# Patient Record
Sex: Male | Born: 1935
Health system: Southern US, Community
[De-identification: ages and names within clinical notes are randomized; demographics above are authoritative.]

## PROBLEM LIST (undated history)

## (undated) DIAGNOSIS — K219 Gastro-esophageal reflux disease without esophagitis: Secondary | ICD-10-CM

## (undated) DIAGNOSIS — I10 Essential (primary) hypertension: Secondary | ICD-10-CM

## (undated) DIAGNOSIS — H3320 Serous retinal detachment, unspecified eye: Secondary | ICD-10-CM

## (undated) DIAGNOSIS — Z95828 Presence of other vascular implants and grafts: Secondary | ICD-10-CM

## (undated) DIAGNOSIS — C189 Malignant neoplasm of colon, unspecified: Secondary | ICD-10-CM

## (undated) DIAGNOSIS — Z85038 Personal history of other malignant neoplasm of large intestine: Secondary | ICD-10-CM

## (undated) DIAGNOSIS — C73 Malignant neoplasm of thyroid gland: Secondary | ICD-10-CM

## (undated) DIAGNOSIS — C8590 Non-Hodgkin lymphoma, unspecified, unspecified site: Secondary | ICD-10-CM

## (undated) DIAGNOSIS — C320 Malignant neoplasm of glottis: Principal | ICD-10-CM

## (undated) DIAGNOSIS — E039 Hypothyroidism, unspecified: Secondary | ICD-10-CM

## (undated) DIAGNOSIS — M1990 Unspecified osteoarthritis, unspecified site: Secondary | ICD-10-CM

## (undated) DIAGNOSIS — H269 Unspecified cataract: Secondary | ICD-10-CM

## (undated) HISTORY — PX: HERNIA REPAIR: SHX51

## (undated) HISTORY — DX: Unspecified osteoarthritis, unspecified site: M19.90

## (undated) HISTORY — DX: Malignant neoplasm of glottis: C32.0

## (undated) HISTORY — DX: Personal history of other malignant neoplasm of large intestine: Z85.038

## (undated) HISTORY — PX: CHOLECYSTECTOMY: SHX55

## (undated) HISTORY — PX: COLON SURGERY: SHX602

## (undated) HISTORY — PX: EYE SURGERY: SHX253

## (undated) HISTORY — DX: Presence of other vascular implants and grafts: Z95.828

## (undated) HISTORY — PX: OTHER SURGICAL HISTORY: SHX169

## (undated) HISTORY — DX: Hypothyroidism, unspecified: E03.9

## (undated) HISTORY — DX: Essential (primary) hypertension: I10

## (undated) HISTORY — DX: Non-Hodgkin lymphoma, unspecified, unspecified site: C85.90

## (undated) HISTORY — DX: Malignant neoplasm of colon, unspecified: C18.9

## (undated) HISTORY — DX: Gastro-esophageal reflux disease without esophagitis: K21.9

## (undated) HISTORY — DX: Unspecified cataract: H26.9

## (undated) HISTORY — DX: Malignant neoplasm of thyroid gland: C73

## (undated) HISTORY — PX: JOINT REPLACEMENT: SHX530

## (undated) HISTORY — DX: Serous retinal detachment, unspecified eye: H33.20

---

## 1981-12-09 HISTORY — PX: OTHER SURGICAL HISTORY: SHX169

## 1991-12-10 HISTORY — PX: OTHER SURGICAL HISTORY: SHX169

## 2001-06-04 ENCOUNTER — Ambulatory Visit (HOSPITAL_COMMUNITY): Admission: RE | Admit: 2001-06-04 | Discharge: 2001-06-04 | Payer: Self-pay | Admitting: Family Medicine

## 2001-06-04 ENCOUNTER — Encounter: Payer: Self-pay | Admitting: Family Medicine

## 2002-09-06 ENCOUNTER — Ambulatory Visit (HOSPITAL_COMMUNITY): Admission: RE | Admit: 2002-09-06 | Discharge: 2002-09-06 | Payer: Self-pay | Admitting: Internal Medicine

## 2002-12-09 HISTORY — PX: OTHER SURGICAL HISTORY: SHX169

## 2004-02-07 ENCOUNTER — Encounter (HOSPITAL_COMMUNITY): Admission: RE | Admit: 2004-02-07 | Discharge: 2004-03-08 | Payer: Self-pay | Admitting: Unknown Physician Specialty

## 2004-06-14 ENCOUNTER — Ambulatory Visit (HOSPITAL_COMMUNITY): Admission: RE | Admit: 2004-06-14 | Discharge: 2004-06-14 | Payer: Self-pay | Admitting: Family Medicine

## 2004-07-03 ENCOUNTER — Observation Stay (HOSPITAL_COMMUNITY): Admission: RE | Admit: 2004-07-03 | Discharge: 2004-07-04 | Payer: Self-pay | Admitting: General Surgery

## 2005-12-09 HISTORY — PX: OTHER SURGICAL HISTORY: SHX169

## 2006-11-18 ENCOUNTER — Emergency Department (HOSPITAL_COMMUNITY): Admission: EM | Admit: 2006-11-18 | Discharge: 2006-11-18 | Payer: Self-pay | Admitting: Emergency Medicine

## 2006-11-19 ENCOUNTER — Ambulatory Visit (HOSPITAL_COMMUNITY): Admission: RE | Admit: 2006-11-19 | Discharge: 2006-11-19 | Payer: Self-pay | Admitting: Internal Medicine

## 2006-11-24 ENCOUNTER — Encounter (HOSPITAL_COMMUNITY): Admission: RE | Admit: 2006-11-24 | Discharge: 2006-12-08 | Payer: Self-pay | Admitting: Oncology

## 2006-11-24 ENCOUNTER — Ambulatory Visit (HOSPITAL_COMMUNITY): Payer: Self-pay | Admitting: Oncology

## 2006-11-27 ENCOUNTER — Ambulatory Visit (HOSPITAL_COMMUNITY): Admission: RE | Admit: 2006-11-27 | Discharge: 2006-11-27 | Payer: Self-pay | Admitting: Oncology

## 2006-12-03 ENCOUNTER — Inpatient Hospital Stay (HOSPITAL_COMMUNITY): Admission: AD | Admit: 2006-12-03 | Discharge: 2006-12-11 | Payer: Self-pay | Admitting: Thoracic Surgery

## 2006-12-03 ENCOUNTER — Ambulatory Visit: Payer: Self-pay | Admitting: Oncology

## 2006-12-04 ENCOUNTER — Encounter (INDEPENDENT_AMBULATORY_CARE_PROVIDER_SITE_OTHER): Payer: Self-pay | Admitting: *Deleted

## 2006-12-04 ENCOUNTER — Encounter (INDEPENDENT_AMBULATORY_CARE_PROVIDER_SITE_OTHER): Payer: Self-pay | Admitting: Interventional Radiology

## 2006-12-04 ENCOUNTER — Encounter (INDEPENDENT_AMBULATORY_CARE_PROVIDER_SITE_OTHER): Payer: Self-pay | Admitting: Specialist

## 2006-12-06 ENCOUNTER — Encounter: Payer: Self-pay | Admitting: Thoracic Surgery

## 2006-12-17 ENCOUNTER — Encounter (HOSPITAL_COMMUNITY): Admission: RE | Admit: 2006-12-17 | Discharge: 2007-01-16 | Payer: Self-pay | Admitting: Oncology

## 2007-01-19 ENCOUNTER — Encounter (HOSPITAL_COMMUNITY): Admission: RE | Admit: 2007-01-19 | Discharge: 2007-02-18 | Payer: Self-pay | Admitting: Oncology

## 2007-01-19 ENCOUNTER — Ambulatory Visit (HOSPITAL_COMMUNITY): Payer: Self-pay | Admitting: Oncology

## 2007-02-03 ENCOUNTER — Ambulatory Visit (HOSPITAL_COMMUNITY): Admission: RE | Admit: 2007-02-03 | Discharge: 2007-02-03 | Payer: Self-pay | Admitting: Oncology

## 2007-03-02 ENCOUNTER — Encounter (HOSPITAL_COMMUNITY): Admission: RE | Admit: 2007-03-02 | Discharge: 2007-04-01 | Payer: Self-pay | Admitting: Oncology

## 2007-03-16 ENCOUNTER — Ambulatory Visit (HOSPITAL_COMMUNITY): Admission: RE | Admit: 2007-03-16 | Discharge: 2007-03-16 | Payer: Self-pay | Admitting: Oncology

## 2007-03-17 ENCOUNTER — Ambulatory Visit (HOSPITAL_COMMUNITY): Payer: Self-pay | Admitting: Oncology

## 2007-04-02 ENCOUNTER — Encounter (HOSPITAL_COMMUNITY): Admission: RE | Admit: 2007-04-02 | Discharge: 2007-05-02 | Payer: Self-pay | Admitting: Oncology

## 2007-04-28 ENCOUNTER — Emergency Department (HOSPITAL_COMMUNITY): Admission: RE | Admit: 2007-04-28 | Discharge: 2007-04-28 | Payer: Self-pay | Admitting: Oncology

## 2007-05-26 ENCOUNTER — Ambulatory Visit (HOSPITAL_COMMUNITY): Payer: Self-pay | Admitting: Oncology

## 2007-05-26 ENCOUNTER — Encounter (HOSPITAL_COMMUNITY): Admission: RE | Admit: 2007-05-26 | Discharge: 2007-06-25 | Payer: Self-pay | Admitting: Oncology

## 2007-08-11 ENCOUNTER — Encounter (HOSPITAL_COMMUNITY): Admission: RE | Admit: 2007-08-11 | Discharge: 2007-09-08 | Payer: Self-pay | Admitting: Oncology

## 2007-08-11 ENCOUNTER — Ambulatory Visit (HOSPITAL_COMMUNITY): Payer: Self-pay | Admitting: Oncology

## 2007-08-13 ENCOUNTER — Ambulatory Visit (HOSPITAL_COMMUNITY): Admission: RE | Admit: 2007-08-13 | Discharge: 2007-08-13 | Payer: Self-pay | Admitting: Oncology

## 2007-09-29 ENCOUNTER — Ambulatory Visit (HOSPITAL_COMMUNITY): Payer: Self-pay | Admitting: Oncology

## 2007-11-10 ENCOUNTER — Encounter (HOSPITAL_COMMUNITY): Admission: RE | Admit: 2007-11-10 | Discharge: 2007-12-09 | Payer: Self-pay | Admitting: Oncology

## 2007-11-23 ENCOUNTER — Ambulatory Visit (HOSPITAL_COMMUNITY): Payer: Self-pay | Admitting: Oncology

## 2007-12-10 HISTORY — PX: OTHER SURGICAL HISTORY: SHX169

## 2008-02-02 ENCOUNTER — Ambulatory Visit (HOSPITAL_COMMUNITY): Payer: Self-pay | Admitting: Oncology

## 2008-02-02 ENCOUNTER — Encounter (HOSPITAL_COMMUNITY): Admission: RE | Admit: 2008-02-02 | Discharge: 2008-03-03 | Payer: Self-pay | Admitting: Oncology

## 2008-02-04 ENCOUNTER — Ambulatory Visit (HOSPITAL_COMMUNITY): Admission: RE | Admit: 2008-02-04 | Discharge: 2008-02-04 | Payer: Self-pay | Admitting: Oncology

## 2008-02-22 ENCOUNTER — Encounter (HOSPITAL_COMMUNITY): Payer: Self-pay | Admitting: Oncology

## 2008-02-22 ENCOUNTER — Encounter (INDEPENDENT_AMBULATORY_CARE_PROVIDER_SITE_OTHER): Payer: Self-pay | Admitting: Diagnostic Radiology

## 2008-02-22 ENCOUNTER — Ambulatory Visit (HOSPITAL_COMMUNITY): Admission: RE | Admit: 2008-02-22 | Discharge: 2008-02-22 | Payer: Self-pay | Admitting: Oncology

## 2008-04-20 ENCOUNTER — Ambulatory Visit (HOSPITAL_COMMUNITY): Payer: Self-pay | Admitting: Oncology

## 2008-04-29 ENCOUNTER — Observation Stay (HOSPITAL_COMMUNITY): Admission: RE | Admit: 2008-04-29 | Discharge: 2008-04-30 | Payer: Self-pay | Admitting: Surgery

## 2008-04-29 ENCOUNTER — Encounter (INDEPENDENT_AMBULATORY_CARE_PROVIDER_SITE_OTHER): Payer: Self-pay | Admitting: Surgery

## 2008-06-01 ENCOUNTER — Encounter (HOSPITAL_COMMUNITY): Admission: RE | Admit: 2008-06-01 | Discharge: 2008-07-01 | Payer: Self-pay | Admitting: Oncology

## 2008-07-13 ENCOUNTER — Ambulatory Visit (HOSPITAL_COMMUNITY): Payer: Self-pay | Admitting: Oncology

## 2008-08-24 ENCOUNTER — Encounter (HOSPITAL_COMMUNITY): Admission: RE | Admit: 2008-08-24 | Discharge: 2008-09-23 | Payer: Self-pay | Admitting: Oncology

## 2008-09-20 ENCOUNTER — Ambulatory Visit (HOSPITAL_COMMUNITY): Admission: RE | Admit: 2008-09-20 | Discharge: 2008-09-20 | Payer: Self-pay | Admitting: Oncology

## 2008-09-27 ENCOUNTER — Ambulatory Visit (HOSPITAL_COMMUNITY): Payer: Self-pay | Admitting: Oncology

## 2008-12-22 ENCOUNTER — Encounter (HOSPITAL_COMMUNITY): Admission: RE | Admit: 2008-12-22 | Discharge: 2009-01-21 | Payer: Self-pay | Admitting: Oncology

## 2008-12-22 ENCOUNTER — Ambulatory Visit (HOSPITAL_COMMUNITY): Payer: Self-pay | Admitting: Oncology

## 2009-03-27 ENCOUNTER — Ambulatory Visit (HOSPITAL_COMMUNITY): Payer: Self-pay | Admitting: Oncology

## 2009-03-27 ENCOUNTER — Encounter (HOSPITAL_COMMUNITY): Admission: RE | Admit: 2009-03-27 | Discharge: 2009-04-26 | Payer: Self-pay | Admitting: Oncology

## 2009-03-28 ENCOUNTER — Ambulatory Visit (HOSPITAL_COMMUNITY): Admission: RE | Admit: 2009-03-28 | Discharge: 2009-03-28 | Payer: Self-pay | Admitting: Oncology

## 2009-06-16 ENCOUNTER — Ambulatory Visit (HOSPITAL_COMMUNITY): Payer: Self-pay | Admitting: Oncology

## 2009-06-16 ENCOUNTER — Encounter (HOSPITAL_COMMUNITY): Admission: RE | Admit: 2009-06-16 | Discharge: 2009-07-16 | Payer: Self-pay | Admitting: Oncology

## 2009-09-11 ENCOUNTER — Ambulatory Visit (HOSPITAL_COMMUNITY): Payer: Self-pay | Admitting: Oncology

## 2009-09-11 ENCOUNTER — Encounter (HOSPITAL_COMMUNITY): Admission: RE | Admit: 2009-09-11 | Discharge: 2009-10-11 | Payer: Self-pay | Admitting: Oncology

## 2009-10-05 ENCOUNTER — Ambulatory Visit (HOSPITAL_COMMUNITY): Admission: RE | Admit: 2009-10-05 | Discharge: 2009-10-05 | Payer: Self-pay | Admitting: Oncology

## 2009-12-09 HISTORY — PX: TRANSURETHRAL RESECTION OF PROSTATE: SHX73

## 2009-12-12 ENCOUNTER — Ambulatory Visit (HOSPITAL_COMMUNITY): Payer: Self-pay | Admitting: Oncology

## 2009-12-12 ENCOUNTER — Encounter (HOSPITAL_COMMUNITY): Admission: RE | Admit: 2009-12-12 | Discharge: 2010-01-11 | Payer: Self-pay | Admitting: Oncology

## 2010-03-05 ENCOUNTER — Ambulatory Visit (HOSPITAL_COMMUNITY): Payer: Self-pay | Admitting: Oncology

## 2010-03-18 IMAGING — CT NM PET TUM IMG RESTAG (PS) SKULL BASE T - THIGH
6 series · 25 of 25 positions shown · IV contrast ([ID])
Comparison: 03/28/2009

CLINICAL DATA: Subsequent treatment strategy for non-Hodgkins
lymphoma.

NUCLEAR MEDICINE PET CT RESTAGING (PS) SKULL BASE TO THIGH
TECHNIQUE: 16.0 mCi F-18 FDG was injected intravenously via the
right arm.  Full-ring PET imaging was performed from the skull base
through the mid-thighs 60  minutes after injection.  CT data was
obtained and used for attenuation correction and anatomic
localization only.  (This was not acquired as a diagnostic CT
examination.)
Fasting Blood Glucose:  101

[Series 1: pet ac · axial · 3.3mm · 4.69mm/px · z∈[-876,-6]mm · 5 of 267 slices shown]
[im 1/267]
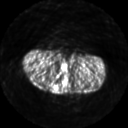
[im 67/267]
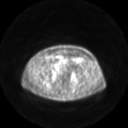
[im 134/267]
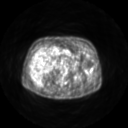
[im 200/267]
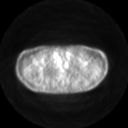
[im 267/267]
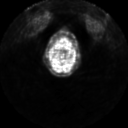

[Series 2: pet nac · axial · 3.3mm · 4.69mm/px · z∈[-876,-6]mm · 6 of 267 slices shown]
[im 1/267]
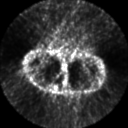
[im 54/267]
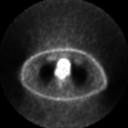
[im 107/267]
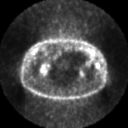
[im 160/267]
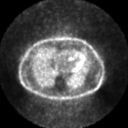
[im 213/267]
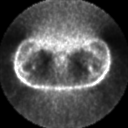
[im 267/267]
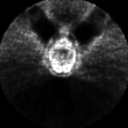

[Series 2: ct images · axial · 3.8mm · 0.98mm/px · z∈[-876,-6]mm · 5 of 267 slices shown]
[im 1/267]
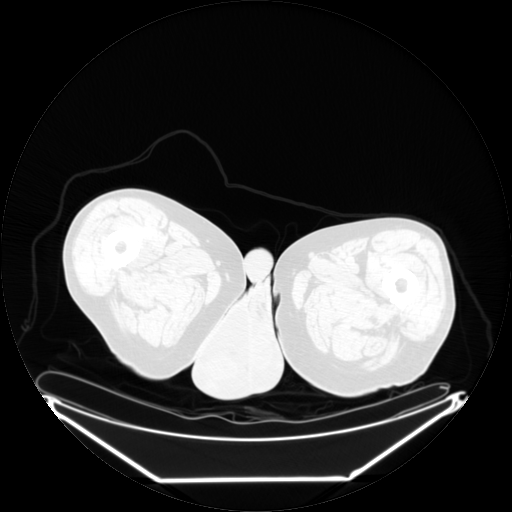
[im 67/267]
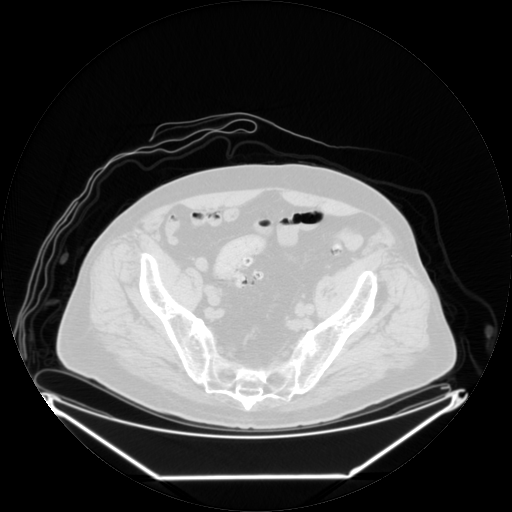
[im 134/267]
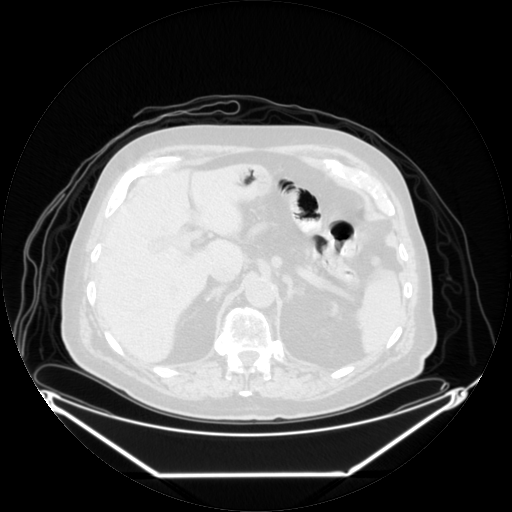
[im 200/267]
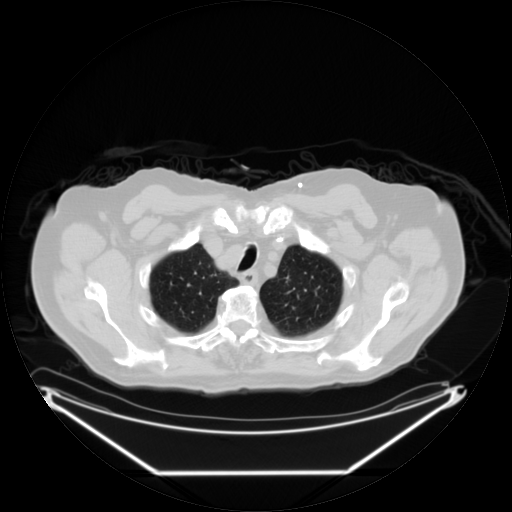
[im 267/267  brain]
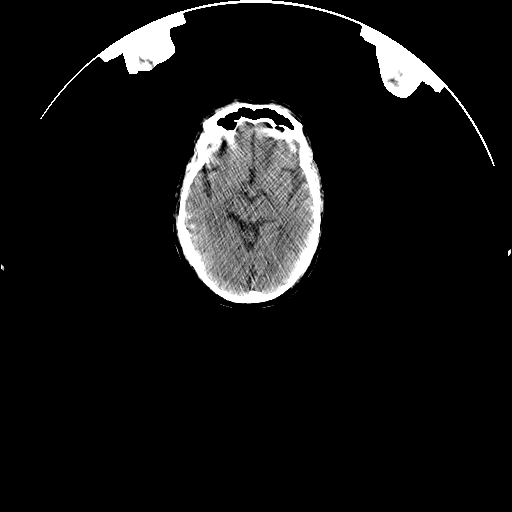

[Series 123: mip · coronal · 3.3mm · 4.69mm/px · 1 of 30 slices shown]
[im 1/30]
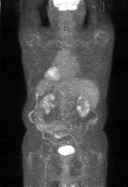

[Series 151: reformatted · axial · 3.3mm · 3.91mm/px · z∈[-876,-6]mm · 6 of 265 slices shown (1 of 2)]
[im 1/265]
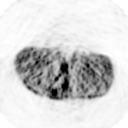
[im 53/265]
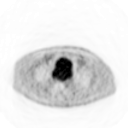
[im 106/265]
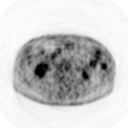
[im 159/265]
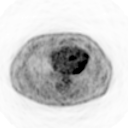
[im 212/265]
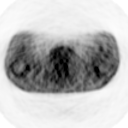
[im 265/265]
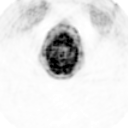

[Series 153: reformatted · coronal · 4.7mm · 6.98mm/px · 2 of 77 slices shown (2 of 2)]
[im 1/77]
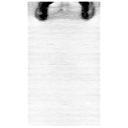
[im 77/77]
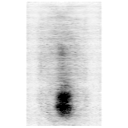

[25 of 25 positions shown; findings below may reference images not displayed]

FINDINGS: Physiologic distribution of activity is seen.  No
hypermetabolic soft tissue masses or adenopathy identified within
the neck, chest, abdomen, or pelvis.
IMPRESSION: Stable exam.  No evidence of recurrent lymphoma.

## 2010-04-16 ENCOUNTER — Encounter (HOSPITAL_COMMUNITY): Admission: RE | Admit: 2010-04-16 | Discharge: 2010-05-16 | Payer: Self-pay | Admitting: Oncology

## 2010-04-25 ENCOUNTER — Ambulatory Visit (HOSPITAL_COMMUNITY): Admission: RE | Admit: 2010-04-25 | Discharge: 2010-04-25 | Payer: Self-pay | Admitting: Oncology

## 2010-05-11 ENCOUNTER — Ambulatory Visit (HOSPITAL_COMMUNITY): Payer: Self-pay | Admitting: Oncology

## 2010-07-27 ENCOUNTER — Ambulatory Visit (HOSPITAL_COMMUNITY): Payer: Self-pay | Admitting: Oncology

## 2010-09-06 ENCOUNTER — Inpatient Hospital Stay (HOSPITAL_COMMUNITY): Admission: RE | Admit: 2010-09-06 | Discharge: 2010-09-07 | Payer: Self-pay | Admitting: Urology

## 2010-09-06 ENCOUNTER — Encounter (INDEPENDENT_AMBULATORY_CARE_PROVIDER_SITE_OTHER): Payer: Self-pay | Admitting: Urology

## 2010-09-24 ENCOUNTER — Ambulatory Visit (HOSPITAL_COMMUNITY): Payer: Self-pay | Admitting: Oncology

## 2010-11-05 ENCOUNTER — Encounter (HOSPITAL_COMMUNITY)
Admission: RE | Admit: 2010-11-05 | Discharge: 2010-12-05 | Payer: Self-pay | Source: Home / Self Care | Attending: Oncology | Admitting: Oncology

## 2010-11-19 ENCOUNTER — Ambulatory Visit (HOSPITAL_COMMUNITY): Payer: Self-pay | Admitting: Oncology

## 2010-12-17 ENCOUNTER — Encounter (HOSPITAL_COMMUNITY): Admission: RE | Admit: 2010-12-17 | Payer: Self-pay | Source: Home / Self Care | Admitting: Oncology

## 2010-12-30 ENCOUNTER — Encounter (HOSPITAL_COMMUNITY): Payer: Self-pay | Admitting: Oncology

## 2011-01-28 ENCOUNTER — Encounter (HOSPITAL_COMMUNITY): Payer: Medicare Other | Attending: Oncology

## 2011-01-28 ENCOUNTER — Other Ambulatory Visit (HOSPITAL_COMMUNITY): Payer: Medicare Other

## 2011-01-28 DIAGNOSIS — Z452 Encounter for adjustment and management of vascular access device: Secondary | ICD-10-CM

## 2011-01-28 DIAGNOSIS — C8589 Other specified types of non-Hodgkin lymphoma, extranodal and solid organ sites: Secondary | ICD-10-CM

## 2011-02-18 LAB — CBC
Hemoglobin: 12.4 g/dL — ABNORMAL LOW (ref 13.0–17.0)
MCH: 29.5 pg (ref 26.0–34.0)
MCHC: 34.3 g/dL (ref 30.0–36.0)
MCV: 86 fL (ref 78.0–100.0)

## 2011-02-18 LAB — COMPREHENSIVE METABOLIC PANEL
BUN: 24 mg/dL — ABNORMAL HIGH (ref 6–23)
CO2: 26 mEq/L (ref 19–32)
Calcium: 8.9 mg/dL (ref 8.4–10.5)
Creatinine, Ser: 1.43 mg/dL (ref 0.4–1.5)
GFR calc Af Amer: 57 mL/min — ABNORMAL LOW (ref >60)
GFR calc non Af Amer: 47 mL/min — ABNORMAL LOW (ref >60)
Glucose, Bld: 142 mg/dL — ABNORMAL HIGH (ref 70–99)
Total Bilirubin: 0.5 mg/dL (ref 0.3–1.2)

## 2011-02-18 LAB — URINALYSIS, ROUTINE W REFLEX MICROSCOPIC
Glucose, UA: NEGATIVE mg/dL
Ketones, ur: NEGATIVE mg/dL
Protein, ur: NEGATIVE mg/dL
Urobilinogen, UA: 0.2 mg/dL (ref 0.0–1.0)
pH: 5 (ref 5.0–8.0)

## 2011-02-18 LAB — URINE MICROSCOPIC-ADD ON

## 2011-02-18 LAB — DIFFERENTIAL
Basophils Absolute: 0.1 10*3/uL (ref 0.0–0.1)
Lymphocytes Relative: 13 % (ref 12–46)
Lymphs Abs: 0.9 10*3/uL (ref 0.7–4.0)
Neutro Abs: 5.2 10*3/uL (ref 1.7–7.7)
Neutrophils Relative %: 76 % (ref 43–77)

## 2011-02-18 LAB — LACTATE DEHYDROGENASE: LDH: 125 U/L (ref 94–250)

## 2011-02-21 LAB — DIFFERENTIAL
Basophils Absolute: 0 10*3/uL (ref 0.0–0.1)
Eosinophils Relative: 3 % (ref 0–5)
Lymphocytes Relative: 10 % — ABNORMAL LOW (ref 12–46)
Lymphocytes Relative: 11 % — ABNORMAL LOW (ref 12–46)
Lymphs Abs: 0.7 10*3/uL (ref 0.7–4.0)
Lymphs Abs: 0.7 10*3/uL (ref 0.7–4.0)
Monocytes Absolute: 0.5 10*3/uL (ref 0.1–1.0)
Monocytes Absolute: 0.6 10*3/uL (ref 0.1–1.0)
Monocytes Relative: 9 % (ref 3–12)
Neutro Abs: 5.3 10*3/uL (ref 1.7–7.7)
Neutro Abs: 5.3 10*3/uL (ref 1.7–7.7)
Neutrophils Relative %: 77 % (ref 43–77)

## 2011-02-21 LAB — CBC
HCT: 33.5 % — ABNORMAL LOW (ref 39.0–52.0)
HCT: 36.4 % — ABNORMAL LOW (ref 39.0–52.0)
Hemoglobin: 11.4 g/dL — ABNORMAL LOW (ref 13.0–17.0)
MCHC: 34 g/dL (ref 30.0–36.0)
MCV: 88.1 fL (ref 78.0–100.0)
Platelets: 134 10*3/uL — ABNORMAL LOW (ref 150–400)
RBC: 3.79 MIL/uL — ABNORMAL LOW (ref 4.22–5.81)
RBC: 4.13 MIL/uL — ABNORMAL LOW (ref 4.22–5.81)
WBC: 6.8 10*3/uL (ref 4.0–10.5)

## 2011-02-21 LAB — BASIC METABOLIC PANEL
Calcium: 8.7 mg/dL (ref 8.4–10.5)
Chloride: 102 mEq/L (ref 96–112)
GFR calc Af Amer: 56 mL/min — ABNORMAL LOW (ref >60)
GFR calc Af Amer: >60 mL/min (ref >60)
GFR calc non Af Amer: 46 mL/min — ABNORMAL LOW (ref >60)
GFR calc non Af Amer: 56 mL/min — ABNORMAL LOW (ref >60)
Glucose, Bld: 105 mg/dL — ABNORMAL HIGH (ref 70–99)
Potassium: 4.2 mEq/L (ref 3.5–5.1)
Potassium: 4.3 mEq/L (ref 3.5–5.1)
Sodium: 141 mEq/L (ref 135–145)

## 2011-02-21 LAB — SURGICAL PCR SCREEN: Staphylococcus aureus: NEGATIVE

## 2011-02-24 LAB — CBC
HCT: 33.7 % — ABNORMAL LOW (ref 39.0–52.0)
Hemoglobin: 11.4 g/dL — ABNORMAL LOW (ref 13.0–17.0)
MCHC: 34 g/dL (ref 30.0–36.0)
MCV: 86.9 fL (ref 78.0–100.0)
RBC: 3.88 MIL/uL — ABNORMAL LOW (ref 4.22–5.81)
RDW: 15.1 % (ref 11.5–15.5)

## 2011-02-24 LAB — DIFFERENTIAL
Basophils Relative: 0 % (ref 0–1)
Eosinophils Absolute: 0.3 10*3/uL (ref 0.0–0.7)
Lymphs Abs: 0.6 10*3/uL — ABNORMAL LOW (ref 0.7–4.0)
Neutro Abs: 4.9 10*3/uL (ref 1.7–7.7)
Neutrophils Relative %: 76 % (ref 43–77)

## 2011-02-24 LAB — COMPREHENSIVE METABOLIC PANEL
ALT: 14 U/L (ref 0–53)
BUN: 21 mg/dL (ref 6–23)
CO2: 29 mEq/L (ref 19–32)
Calcium: 8.6 mg/dL (ref 8.4–10.5)
GFR calc non Af Amer: 43 mL/min — ABNORMAL LOW (ref >60)
Glucose, Bld: 113 mg/dL — ABNORMAL HIGH (ref 70–99)
Sodium: 140 mEq/L (ref 135–145)
Total Protein: 6 g/dL (ref 6.0–8.3)

## 2011-02-24 LAB — SEDIMENTATION RATE: Sed Rate: 31 mm/hr — ABNORMAL HIGH (ref 0–16)

## 2011-02-24 LAB — LACTATE DEHYDROGENASE: LDH: 159 U/L (ref 94–250)

## 2011-02-25 LAB — GLUCOSE, CAPILLARY: Glucose-Capillary: 137 mg/dL — ABNORMAL HIGH (ref 70–99)

## 2011-02-26 LAB — DIFFERENTIAL
Eosinophils Absolute: 0.1 10*3/uL (ref 0.0–0.7)
Eosinophils Relative: 2 % (ref 0–5)
Lymphs Abs: 0.6 10*3/uL — ABNORMAL LOW (ref 0.7–4.0)
Monocytes Absolute: 0.5 10*3/uL (ref 0.1–1.0)
Monocytes Relative: 8 % (ref 3–12)

## 2011-02-26 LAB — CBC
MCHC: 35.1 g/dL (ref 30.0–36.0)
RBC: 3.77 MIL/uL — ABNORMAL LOW (ref 4.22–5.81)
RDW: 15.4 % (ref 11.5–15.5)

## 2011-02-26 LAB — COMPREHENSIVE METABOLIC PANEL
ALT: 17 U/L (ref 0–53)
AST: 18 U/L (ref 0–37)
Calcium: 9 mg/dL (ref 8.4–10.5)
GFR calc Af Amer: >60 mL/min (ref >60)
Potassium: 3.9 mEq/L (ref 3.5–5.1)
Sodium: 137 mEq/L (ref 135–145)
Total Protein: 5.9 g/dL — ABNORMAL LOW (ref 6.0–8.3)

## 2011-03-14 LAB — COMPREHENSIVE METABOLIC PANEL
ALT: 17 U/L (ref 0–53)
AST: 19 U/L (ref 0–37)
Albumin: 3.8 g/dL (ref 3.5–5.2)
Alkaline Phosphatase: 82 U/L (ref 39–117)
BUN: 26 mg/dL — ABNORMAL HIGH (ref 6–23)
CO2: 29 mEq/L (ref 19–32)
Calcium: 8.8 mg/dL (ref 8.4–10.5)
Chloride: 105 mEq/L (ref 96–112)
Creatinine, Ser: 1.29 mg/dL (ref 0.4–1.5)
GFR calc Af Amer: >60 mL/min (ref >60)
GFR calc non Af Amer: 53 mL/min — ABNORMAL LOW (ref >60)
Glucose, Bld: 126 mg/dL — ABNORMAL HIGH (ref 70–99)
Potassium: 4.2 mEq/L (ref 3.5–5.1)
Sodium: 139 mEq/L (ref 135–145)
Total Bilirubin: 0.5 mg/dL (ref 0.3–1.2)
Total Protein: 6 g/dL (ref 6.0–8.3)

## 2011-03-14 LAB — DIFFERENTIAL
Basophils Absolute: 0 10*3/uL (ref 0.0–0.1)
Basophils Relative: 0 % (ref 0–1)
Eosinophils Absolute: 0.2 10*3/uL (ref 0.0–0.7)
Eosinophils Relative: 3 % (ref 0–5)
Lymphocytes Relative: 10 % — ABNORMAL LOW (ref 12–46)
Lymphs Abs: 0.6 10*3/uL — ABNORMAL LOW (ref 0.7–4.0)
Monocytes Absolute: 0.5 10*3/uL (ref 0.1–1.0)
Monocytes Relative: 8 % (ref 3–12)
Neutro Abs: 4.7 10*3/uL (ref 1.7–7.7)
Neutrophils Relative %: 79 % — ABNORMAL HIGH (ref 43–77)

## 2011-03-14 LAB — CBC
HCT: 34.5 % — ABNORMAL LOW (ref 39.0–52.0)
Hemoglobin: 12 g/dL — ABNORMAL LOW (ref 13.0–17.0)
MCHC: 34.7 g/dL (ref 30.0–36.0)
MCV: 88 fL (ref 78.0–100.0)
Platelets: 131 10*3/uL — ABNORMAL LOW (ref 150–400)
RBC: 3.92 MIL/uL — ABNORMAL LOW (ref 4.22–5.81)
RDW: 15.1 % (ref 11.5–15.5)
WBC: 6 10*3/uL (ref 4.0–10.5)

## 2011-03-14 LAB — SEDIMENTATION RATE: Sed Rate: 25 mm/hr — ABNORMAL HIGH (ref 0–16)

## 2011-03-17 LAB — COMPREHENSIVE METABOLIC PANEL
ALT: 15 U/L (ref 0–53)
AST: 19 U/L (ref 0–37)
Calcium: 9.1 mg/dL (ref 8.4–10.5)
Creatinine, Ser: 1.31 mg/dL (ref 0.4–1.5)
GFR calc Af Amer: >60 mL/min (ref >60)
Sodium: 139 mEq/L (ref 135–145)
Total Protein: 6 g/dL (ref 6.0–8.3)

## 2011-03-17 LAB — DIFFERENTIAL
Eosinophils Absolute: 0.1 10*3/uL (ref 0.0–0.7)
Eosinophils Relative: 2 % (ref 0–5)
Lymphocytes Relative: 9 % — ABNORMAL LOW (ref 12–46)
Lymphs Abs: 0.6 10*3/uL — ABNORMAL LOW (ref 0.7–4.0)
Monocytes Relative: 8 % (ref 3–12)
Neutrophils Relative %: 81 % — ABNORMAL HIGH (ref 43–77)

## 2011-03-17 LAB — CBC
MCHC: 35.1 g/dL (ref 30.0–36.0)
RDW: 15.1 % (ref 11.5–15.5)

## 2011-03-17 LAB — LACTATE DEHYDROGENASE: LDH: 121 U/L (ref 94–250)

## 2011-03-20 LAB — CBC
HCT: 35.7 % — ABNORMAL LOW (ref 39.0–52.0)
Hemoglobin: 12.3 g/dL — ABNORMAL LOW (ref 13.0–17.0)
RBC: 3.99 MIL/uL — ABNORMAL LOW (ref 4.22–5.81)
RDW: 15.3 % (ref 11.5–15.5)

## 2011-03-20 LAB — COMPREHENSIVE METABOLIC PANEL
ALT: 20 U/L (ref 0–53)
Alkaline Phosphatase: 87 U/L (ref 39–117)
BUN: 26 mg/dL — ABNORMAL HIGH (ref 6–23)
CO2: 28 mEq/L (ref 19–32)
GFR calc non Af Amer: 47 mL/min — ABNORMAL LOW (ref >60)
Glucose, Bld: 141 mg/dL — ABNORMAL HIGH (ref 70–99)
Potassium: 4.2 mEq/L (ref 3.5–5.1)
Sodium: 139 mEq/L (ref 135–145)
Total Bilirubin: 0.6 mg/dL (ref 0.3–1.2)
Total Protein: 6 g/dL (ref 6.0–8.3)

## 2011-03-20 LAB — DIFFERENTIAL
Basophils Absolute: 0 10*3/uL (ref 0.0–0.1)
Basophils Relative: 0 % (ref 0–1)
Eosinophils Absolute: 0.2 10*3/uL (ref 0.0–0.7)
Neutro Abs: 4.7 10*3/uL (ref 1.7–7.7)
Neutrophils Relative %: 82 % — ABNORMAL HIGH (ref 43–77)

## 2011-03-20 LAB — LACTATE DEHYDROGENASE: LDH: 141 U/L (ref 94–250)

## 2011-03-20 LAB — GLUCOSE, CAPILLARY: Glucose-Capillary: 132 mg/dL — ABNORMAL HIGH (ref 70–99)

## 2011-03-20 LAB — SEDIMENTATION RATE: Sed Rate: 10 mm/hr (ref 0–16)

## 2011-03-25 ENCOUNTER — Encounter (HOSPITAL_COMMUNITY): Payer: Medicare Other | Attending: Oncology

## 2011-03-25 DIAGNOSIS — Z452 Encounter for adjustment and management of vascular access device: Secondary | ICD-10-CM

## 2011-03-25 DIAGNOSIS — C8589 Other specified types of non-Hodgkin lymphoma, extranodal and solid organ sites: Secondary | ICD-10-CM

## 2011-03-25 LAB — COMPREHENSIVE METABOLIC PANEL
ALT: 16 U/L (ref 0–53)
Albumin: 3.8 g/dL (ref 3.5–5.2)
Alkaline Phosphatase: 71 U/L (ref 39–117)
Calcium: 8.8 mg/dL (ref 8.4–10.5)
Glucose, Bld: 105 mg/dL — ABNORMAL HIGH (ref 70–99)
Potassium: 3.9 mEq/L (ref 3.5–5.1)
Sodium: 138 mEq/L (ref 135–145)
Total Protein: 6 g/dL (ref 6.0–8.3)

## 2011-03-25 LAB — DIFFERENTIAL
Eosinophils Absolute: 0.2 10*3/uL (ref 0.0–0.7)
Lymphs Abs: 0.4 10*3/uL — ABNORMAL LOW (ref 0.7–4.0)
Monocytes Absolute: 0.4 10*3/uL (ref 0.1–1.0)
Monocytes Relative: 9 % (ref 3–12)
Neutro Abs: 4 10*3/uL (ref 1.7–7.7)
Neutrophils Relative %: 79 % — ABNORMAL HIGH (ref 43–77)

## 2011-03-25 LAB — CBC
Hemoglobin: 12 g/dL — ABNORMAL LOW (ref 13.0–17.0)
MCHC: 33 g/dL (ref 30.0–36.0)
Platelets: 123 10*3/uL — ABNORMAL LOW (ref 150–400)
RDW: 14.6 % (ref 11.5–15.5)

## 2011-03-25 LAB — LACTATE DEHYDROGENASE: LDH: 128 U/L (ref 94–250)

## 2011-04-23 NOTE — Op Note (Signed)
Castaneda, Duane             ACCOUNT NO.:  [id number]   MEDICAL RECORD NO.:  [id number]          PATIENT TYPE:  INP   LOCATION:  5123                         FACILITY:  [facility]   PHYSICIAN:  Velora Heckler, MD      DATE OF BIRTH:  May 12, 1923   DATE OF PROCEDURE:  DATE OF DISCHARGE:                               OPERATIVE REPORT   PREOPERATIVE DIAGNOSES:  Thyroid nodules, cytologic atypia, and abnormal  PET scan activity.   POSTOPERATIVE DIAGNOSES:  Thyroid nodules, cytologic atypia, and  abnormal PET scan activity.   PROCEDURE:  Total thyroidectomy.   SURGEON:  Velora Heckler, MD, FACS   ASSISTANT:  Adolph Pollack, MD, FACS   ANESTHESIA:  General.   ESTIMATED BLOOD LOSS:  100 mL.   PREPARATION:  Betadine.   COMPLICATIONS:  None.   INDICATIONS:  The patient is an 77 year old white male who is referred  by Dr. Glenford Peers for thyroid abnormality noted on PET scan.  The  patient has a history of B-cell lymphoma, which has been treated  successfully.  He is in remission.  However, a surveillance PET scan  showed activity in the thyroid.  Thyroid ultrasound demonstrated thyroid  nodules.  Fine-needle aspiration biopsy of nodules in the right pole  showed atypia suspicious for malignancy.  The patient now comes to  surgery for thyroidectomy for definitive diagnosis.   BODY OF REPORT:  Procedure was done in OR #16 at the [facility].  The patient was brought to the operating room and  placed in supine position on the operating room table.  Following  administration of general anesthesia, the patient was positioned and  then prepped and draped in the usual strict aseptic fashion.  After  ascertaining that an adequate level of anesthesia had been achieved, a  Kocher incision was made from #15 blade.  Hemostasis was obtained with  the electrocautery.  Platysma was divided with the electrocautery.  Subplatysmal flaps were elevated cephalad and  caudad.  A [place]orner self-  retaining retractor was placed for exposure.  Strap muscles were incised  in the midline and dissection was carried down to the trachea, thyroid  cartilage, and thyroid isthmus.  Dissection was begun on the left side.  Strap muscles were reflected laterally.  A relatively small left lobe  was exposed.  There were at least two nodules in the superior pole.  The  left lobe was dissected out gently.  Venous tributaries were divided  between medium Ligaclips with a Harmonic scalpel.  The gland was gently  mobilized using a Pension scheme manager.  Superior pole was taken down by  dividing the vessels between medium Ligaclips with the Harmonic scalpel.  Gland was mobilized and rolled anteriorly.  Branches of the inferior  thyroid artery were divided between small Ligaclips with the Harmonic  scalpel.  Gland was rolled up and onto the anterior trachea and the  ligament of Allyson Sabal was transected with the electrocautery.  Isthmus was  mobilized across the midline.  Inferiorly, there was a hard calcified  lymph node overlying the trachea.  This  was mobilized and included with  the specimen attached to the inferior aspect of the isthmus.   Next, we turned our attention to the right thyroid lobe.  Again, strap  muscles were reflected laterally.  Right lobe was markedly larger.  There was approximately a 3-cm nodule on the inferior pole.  There were  several small hard nodules present in the mid and upper pole of the  right lobe.  Venous tributaries were divided between medium Ligaclips  with the Harmonic scalpel.  Superior pole vessels were dissected out and  divided between medium Ligaclips with the Harmonic scalpel.  Gland was  rolled anteriorly.  Parathyroid tissue was identified and preserved.  Branches of the inferior thyroid artery were divided between small  Ligaclips with the Harmonic scalpel.  Recurrent nerve was identified and  preserved.  Ligament of Allyson Sabal was  transected with the electrocautery.  Gland was rolled up and onto the anterior trachea, from which it was  excised with the electrocautery.  Suture was used to mark the right  superior pole.  The entire thyroid gland was then submitted to pathology  for review.  Neck was irrigated with warm saline and hemostasis was  obtained with medium Ligaclips and use of the electrocautery.  Surgicel  was placed in the operative field.  Strap muscles were reapproximated in  the midline with interrupted 3-0 Vicryl sutures.  Platysma was closed  with interrupted 3-0 Vicryl sutures.  Skin was closed with a running 4-0  Monocryl subcuticular suture.  The wound was washed and dried and  benzoin and Steri-Strips were applied.  Sterile dressings were applied.  The patient was awakened from anesthesia and brought to the recovery  room in stable condition.  The patient tolerated the procedure well.      Velora Heckler, MD  Electronically Signed     TMG/MEDQ  D:  04/29/2008  T:  04/29/2008  Job:  [id number]   cc:   Ladona Horns. Mariel Sleet, MD  [name], M.D.  Sherrie Mustache, M.D.

## 2011-04-26 NOTE — Discharge Summary (Signed)
Duane Castaneda, Duane Castaneda             ACCOUNT NO.:  [id number]   MEDICAL RECORD NO.:  [id number]          PATIENT TYPE:  INP   LOCATION:  1335                         FACILITY:  [facility]   PHYSICIAN:  Blenda Nicely. Shadad        DATE OF BIRTH:  05/05/1923   DATE OF ADMISSION:  12/05/2006  DATE OF DISCHARGE:  12/11/2006                               DISCHARGE SUMMARY   DISCHARGE DIAGNOSES:  1. Diffuse large-cell lymphoma, status post chemotherapy with CHOP-R.  2. Hypercalcemia, resolved.  3. Acute renal failure, resolved.  4. Gastrointestinal/deep venous thrombosis prophylaxis.  5. No code blue.  6. Hypokalemia, discharged with medications to correct it.  7. Thrush, discharged on medications to correct it.  8. Poor nutritional status, improved.  9. Anemia, improved.   PROCEDURE:  1. Status post right thyroid nodule biopsy by interventional      radiology, December 04, 2006.  2. Status post Port-A-Cath placement on the left, Dr. Edwyna Shell, December 05, 2006.  3. Chemotherapy, December 08, 2006, CHOP-R, at the following rates:      Cyclophosphamide 750 mg/sq m equaling 1500 mg IV over 2 hours on      December 08, 2006; vincristine 1.4 mg/sq m equaling 2 mg IV push on      December 08, 2006; Adriamycin 50 mg/sq m equaling 400 mg IV on      December 08, 2006; prednisone 100 mg p.o. daily for 5 days      initiating on December 08, 2006 through December 12, 2006; and      Rituxan 375 mg/sq m equaling 750 mg IV on December 08, 2006.  This      was premedicated with Zofran and Decadron prior to chemotherapy.      He also was given hydration.  4. MUGA studies showing 67% ejection fraction.   LABORATORY DATA:  Sodium 138, potassium 3, BUN 33, creatinine 1.24, and  calcium 9.3.  Total bilirubin 0.8, alkaline phosphatase 70, AST 20, ALT  18, total protein 4.7, albumin 2.3, calcium 9.4, phosphorus 3.4, and  magnesium 1.6.  Hemoglobin 9.4, hematocrit 28.1, platelets 168, white  count 7.1, MCV 84.9,  and neutrophil count 6.3.  Uric acid 4.3.  LDH 190.  TSH 0.181.  PTT 26, PT 13.8, and INR 1.   RADIOLOGICAL STUDIES:  Chest x-ray, December 06, 2006, showing overall  improvement in bibasilar aeration compared with chest x-ray on December 05, 2006.  Persistent left lower lobe atelectasis or infiltrate and left  pleural effusion.  CT-guided ultrasound biopsy December 04, 2006.  CT-  guided core biopsy of retroperitoneal adenopathy December 04, 2006.   MEDICATIONS ON DISCHARGE:  1. Ziac 2.5/6.25 mg every other day.  2. Synthroid 50 mcg p.o. daily.  3. Zyloprim 300 mg p.o. daily.  4. Aspirin 325 mg p.o. daily.  5. Hytrin 10 mg p.o. daily.  6. Prinivil 20 mg p.o. daily.  7. Zofran 8 mg p.o. q.8h. p.r.n. for nausea.  8. Protonix 40 mg p.o. daily.  9. Prednisone 100 mg to stop on December 12, 2006.  10.Mycelex troches  5 x a day for 5 days.   CONDITION ON DISCHARGE:  Improved.   FOLLOW UP:  Follow up with Dr. Mariel Sleet, telephone No. [phone], in 7  days.  The office of Dr. Mariel Sleet will be informed of the patient's  visit and will contact Mr. Pankratz.   HISTORY OF PRESENT ILLNESS:  [name] is a pleasant 77 year old  white male admitted on November 18, 2006 to the emergency department  with complaints of a 2-week history of abdominal pain.  A CT of the  abdomen was performed after the patient was found to have a palpable  pulsatile mass for further evaluation.  Although no aneurysm,  significant bulky adenopathy, retroperitoneal and mesenteric, were  observed, suspicious for lymphoma versus metastases.  He was discharged  with instructions to follow all of these findings up as an outpatient.  CT of the chest revealed a right thyroid mass along with extensive  posterior mediastinal lymphadenopathy and 2 tiny areas of questionable  nodularity in the right lung, right middle lobe, and right lower lobe,  nonspecific.  PET scan on November 27, 2006 showed diffuse abdominal   adenopathy with significant SUV range and a large subcarinal mass also  with significant uptake and marked FTG in the right thyroid mass with  suspicion for malignancy.  Dr. Mariel Sleet had seen him as an outpatient  and arrangements were made to undergo biopsy of the thyroid mass and  retroperitoneal bulky adenopathy for definitive diagnosis.   HOSPITAL COURSE:  On December 05, 2006, the patient underwent ultrasound-  guided biopsy of the thyroid and retroperitoneal bulky adenopathy with a  diagnosis consistent with large-cell lymphoma with immunohistochemical  staining pattern consistent with B-cell phenotype.  For further details  please refer to the well-described pathology report on December 04, 2006, case No. [id number], Dr. Delila Spence.  In addition, the retroperitoneal  needle biopsy, case No. [id number], Dr. Delila Spence, was as well consistent  with non-Hodgkin's lymphoma, large B-cell phenotype.  Thus the patient  was transferred to the service of Dr. Clelia Croft while in the hospital for  initiation of chemotherapy consisting of CHOP-R.  Of note, the patient  was noticed to have hypercalcemia on admission.  His calcium levels were  initially 12.9 as an outpatient and as of December 03, 3006 was 13.2.  The patient received Zometa on admission, and his calcium began to  decrease; although, the patient was lethargic for quite some time due to  this high calcium level.  He also was noticed to have acute renal  insufficiency, pre-renal.  This, with the proper hydration, began to  improve.  These 2 issues were resolved over the following days.  On the  day of discharge, his calcium level was within normal limits at 9.3, and  his creatinine was 1.2.  In addition, his code status was discussed, and  it was documented that the patient is now no code blue.  Of note,  although his overall status has improved, the patient tolerated chemotherapy well, and no complications were noted and no tumor lysis  was  seen, physical therapy  was contacted in order to provide a rolling walker and a long tub bench  after discharge for comfort.  After evaluation by Dr. Clelia Croft, it was  concluded that Mr. Montagna is stable for discharge.  He knows to call  to the cancer center if he has any questions or concerns.      Marlowe Kays, P.A.      Blenda Nicely. [employer]  Electronically Signed    SW/MEDQ  D:  12/11/2006  T:  12/11/2006  Job:  [id number]   cc:   Ladona Horns. Mariel Sleet, MD  Fax: [phone]

## 2011-04-26 NOTE — Consult Note (Signed)
Duane Castaneda, Duane Castaneda             ACCOUNT NO.:  [id number]   MEDICAL RECORD NO.:  [id number]          PATIENT TYPE:  INP   LOCATION:                               FACILITY:  [facility]   PHYSICIAN:  Firas N. Shadad        DATE OF BIRTH:  21-Apr-1923   DATE OF CONSULTATION:  DATE OF DISCHARGE:                                 CONSULTATION   DATE OF CONSULTATION:  December 03, 2006   REASON FOR CONSULTATION:  Lung cancer.   HISTORY OF PRESENT ILLNESS:  Duane Castaneda is an 77 year old male  admitted on November 18, 2006, to the emergency department with  complaints of two week history of abdominal pain.  A CT of the abdomen  was performed after the patient was found to have a palpable pulsatile  mass, for further evaluation.  Although no aneurysm, significant bulky  adenopathy, retroperitoneal and mesenteric, the last one being 8.6 by  4.6-cm were reason for lymphoma versus metastasis, was seen.  He was  discharged with instructions to follow all of these findings up as an  outpatient.  A CT of the chest revealed a right thyroid mass of 3.7 by  2.7-cm along with extensive posterior mediastinal lymphadenopathy and  tiny areas of questionable nodularity in the right lung, right middle  lobe, right lower lobe, nonspecific.  PET scan on November 27, 2006,  showed diffuse abdominal adenopathy with uptake of 25 to 30 SUV range, a  large subcarinal mass of 2.8 cm with SUV of 21, and a marked uptake in  the right thyroid mass with SUV of 9.6 were reason for malignancy.  Dr.  Mariel Sleet saw him as an outpatient, since the patient lives in  [place].  We are awaiting records for further details.  He was  admitted to [facility] now, and we were requested to see him  while here with further recommendations until he is discharged.  The  patient is followed biopsy of the right thyroid mass and abdominal mass,  per interventional radiology, and possible placement of a porta-cath.   PAST MEDICAL  HISTORY:  1. Osteoarthritis.  2. Hypothyroidism.  3. Hypertension.  4. Sigmoid diverticulosis.  5. Prostate enlargement.  6. Ejection fraction of 61 percent.  7. Colon cancer in 1993 with resection, no chemotherapy.   PAST SURGICAL HISTORY:  1. Status post right inguinal hernia repair in September 2000.  2. Left inguinal hernia repair in July 2005.  3. Status post left total knee replacement.  4. Status post cholecystectomy in 1993.  5. Status post detached retinal surgery.  6. Status post colectomy in 1993, Dr. Loleta Books in [place]. This      data needs to be verified.   ALLERGIES:  No known drug allergies.   CURRENT MEDICATIONS:  Morphine sulfate p.r.n., Zofran, Percocet, Senokot  p.r.n., IV fluids at 125 normal saline mL an hour.   REVIEW OF SYMPTOMS:  Review of systems is remarkable for abdominal pain,  as described above, and posterior dysphagia secondary to the mass.  The  patient cannot tolerate solids.  He can, to a  certain extent, can  tolerate liquids.  The rest of the review of systems is negative.   FAMILY HISTORY:  Mother died with leukemia.  Father died with lung  cancer.  One sister died of unknown etiology, the patient does not  remember.  One brother died with kidney cancer.   SOCIAL HISTORY:  The patient is a widow.  He has three children.  He is  a retired Curator.  No alcohol history.  He quit in 1978 the use of  cigarettes.  He lives in [place].  His last colonoscopy was within  five years.   PHYSICAL EXAMINATION:  GENERAL:  This is an 77 year old, white male in  no acute distress, alert and oriented times 3.  HEENT:  Normocephalic, atraumatic, pupils equal, reactive to light and  accommodation, mucous membranes are moist without thrush or lesion.  NECK:  Supple, no cervical or supraclavicular masses.  LUNGS:  Clear to auscultation bilaterally.  Cannot palpate the thyroid  mass.  CARDIOVASCULAR:  Regular rate and rhythm without murmurs, rubs  or  gallops.  ABDOMEN:  Soft and non tender.  Bowel sounds times 4.  No palpable  spleen or liver.  GENITOURINARY:  Deferred.  RECTAL:  Deferred.  EXTREMITIES:  No clubbing or cyanosis.  No edema.  SKIN:  With some discoloration in both lower extremities with no  petechial rash.  NEUROLOGICAL:  Nonfocal.   LABORATORY DATA:  Hemoglobin 12.1, hematocrit 36.3, white count 7.2,  platelets 250, neutrophils 6.1, MCV 84.9, PT 13.4, PTT 26, INR 1.  LDH  149, iron 31, TIBC 291, percent saturation 11, ferritin 129, sodium 138,  potassium 3.7, BUN 32, creatinine 1.5, glucose 116, total bilirubin 0.6,  alka phos is 101, AST 18, ALT 15, total protein, 5.8, albumin 3.7,  calcium 12.9 on November 24, 2006.  As of November 18, 2006, was 11.  New labs are pending.   ASSESSMENT AND PLAN:  1. Dr. Clelia Croft has seen and evaluated the patient and the chart has      been reviewed.  The patient is an 77 year old, white male, found to      have diffuse adenopathy, and subcarinal lymph nodes by Dr.      Mariel Sleet in [place], he was evaluated.  Likely, this is      metastatic cancer versus lymphoma.  He was admitted by Dr. Edwyna Shell      to do a biopsy.  Further recommendations will take place pending on      the diagnosis.  2. Diffuse lymphadenopathy, likely lymphoma versus metastatic cancer.  3. Acute renal failure, elevated BUN and creatinine.  4. Hypercalcemia.  5. Lethargy secondary to acute renal failure and hypercalcemia.   RECOMMENDATIONS:  1. Agree with ultrasound-guided biopsy.  2. IV hydration with normal saline.  3. If calcium does not improve, will treat with pamidronate 90 mg IV      over two hours.   Thank you very much for allowing [place] the opportunity to participate in  the care of Duane Castaneda.  We will continue to follow.      Marlowe Kays, P.A.      Blenda Nicely. [facility]  Electronically Signed    SW/MEDQ  D:  12/09/2006  T:  12/09/2006  Job:  [id number]

## 2011-04-26 NOTE — Procedures (Signed)
NAME:  Duane Castaneda, Duane Castaneda                       ACCOUNT NO.:  [id number]   MEDICAL RECORD NO.:  [id number]                   PATIENT TYPE:  OUT   LOCATION:  RAD                                  FACILITY:  [facility]   PHYSICIAN:  Dani Gobble, MD                    DATE OF BIRTH:  01/07/1923   DATE OF PROCEDURE:  06/14/2004  DATE OF DISCHARGE:                                  ECHOCARDIOGRAM   INDICATION:  [name] is a 76 year old gentleman with a history of  hypertension who has been experiencing lower extremity edema and a murmur.   The aorta is within normal limits at 3.0 cm.   1. The left atrium is mildly dilated at 4.8 cm.  The patient appeared to be     in sinus rhythm during this procedure.  No obvious clots or masses were     appreciated.  2. The ventricular septum is moderately thickened at 1.6 cm.  The posterior     wall was within normal limits at 1.1 cm.  3. The aortic valve is thickened, particularly on the left coronary cusp but     with normal leaflet excursion.  Trivial aortic insufficiency is noted.     Doppler interrogation of the aortic valve reveals a peak gradient of 15     mmHg and mean gradient of 7 mmHg consistent with mild aortic sclerosis     without stenosis.  4. The mitral valve appears grossly structurally normal.  No mitral valve     prolapse is noted.  Leaflet excursion is normal.  Mild mitral     regurgitation is appreciated.  5. The pulmonic valve is incompletely visualized, but mild pulmonic     insufficiency is noted.  6. The tricuspid valve also appears grossly structurally normal with mild     tricuspid regurgitation noted.  7. Estimated RVSP is approximately 34 mmHg at the upper limits of normal.  8. The left ventricle is normal is size with the LV IDD measuring 4.1 cm.     The LV ISD measures 3.6 cm.  Overall left ventricular systolic function     is normal, and no regional wall motion abnormalities are appreciated.     The right atrium is  mildly dilated while the right ventricle also appears     somewhat generous but with preserved right ventricular systolic function.     There is no evidence for diastolic dysfunction on this study.   IMPRESSION:  1. Mild to moderate biatrial enlargement.  2. Moderate asymmetric septal hypertrophy.  3. Normal left ventricular size and systolic function without regional wall     motion abnormality noted.  4. Mild aortic sclerosis without stenosis.  5. Mild mitral and tricuspid regurgitation.  6. Trivial aortic insufficiency.  7. Mild right ventricular enlargement with preserved right ventricular     systolic function.  8. Trivial pulmonic insufficiency.  ___________________________________________                                            Dani Gobble, MD   AB/MEDQ  D:  06/14/2004  T:  06/14/2004  Job:  [id number]   cc:   Corrie Mckusick, M.D.  [address] Laurell Josephs. [place]  [place]  [place] [address]  Fax: [phone]

## 2011-04-26 NOTE — H&P (Signed)
NAMEBENFORD, ASCH NO.:  [id number]   MEDICAL RECORD NO.:  [id number]          PATIENT TYPE:  INP   LOCATION:  2039                         FACILITY:  [facility]   PHYSICIAN:  Ines Bloomer, M.D. DATE OF BIRTH:  11-22-1923   DATE OF ADMISSION:  12/03/2006  DATE OF DISCHARGE:                              HISTORY & PHYSICAL   PRIMARY CARE PHYSICIAN:  [name], M.D. at [facility].   ONCOLOGIST:  Ladona Horns. Neijstrom, M.D.   CHIEF COMPLAINT:  Abdominal pain since early December.   HISTORY OF PRESENT ILLNESS:  Duane Castaneda is an 77 year old Caucasian  male recently seen in the [facility] emergency department with abdominal  pain and generalized decline in health over the past 3 months.  He  initially had an episode of chest pain but was cleared from a cardiac  standpoint.  He was discharged home, but presented just 3 days later in  early December with left-sided abdominal pain.  They initially thought  he had an abdominal aortic aneurysm and had him undergo a CT scan of the  abdomen and pelvis.  This ruled out for an abdominal aortic aneurysm  which showed extensive abdominal lymphadenopathy concerning for  lymphoma.  He underwent a PET scan which showed positive right thyroid  mass and chest CT showing probable mediastinal adenopathy.  He saw Dr.  Mariel Sleet, who referred him to Dr. Algis Downs Karle Plumber for biopsy.   Mr. Bechard reports that since his initial episode of chest pain, he  has had no further chest pain, but has had persistent abdominal pain,  more pronounced on the left side.  He has been taking hydrocodone for  this.  He also reports about a 20-pound weight loss over the past 6  weeks or so.  He has had a decreased appetite, but denies dysphagia.   PAST MEDICAL HISTORY:  1. Hypertension.  2. Hypothyroidism.  3. Colon cancer in 1993, which apparently was stage I.  4. Recently treated for hypercalcemia with Zometa last  week.  5. Postoperative ileus following his total knee replacement.   PAST SURGICAL HISTORY:  1. Bilateral inguinal hernia repairs.  2. Left total knee replacement.  3. Cholecystectomy.  4. Detached retina repaired x2 in the left eye.  5. History of colon resection in 1993 by Dr. Katrinka Blazing in [place].   ALLERGIES:  No known drug allergies.   MEDICATIONS:  1. Prinivil 20 mg p.o. daily.  2. Ziac 2.5/6.25 mg p.o. daily.  3. Zyloprim 300 mg p.o. daily.  4. Synthroid 50 mcg daily.  5. Aspirin 325 mg daily.  6. Hytrin 10 mg daily.  7. Glucosamine 2 tablets daily.  8. Fish oil capsules 1000 mg p.o. t.i.d.  9. Hydrocodone 5/500 mg p.r.n.   REVIEW OF SYSTEMS:  See HPI for pertinent positives and negatives.  In  addition, he does report chronic left hand numbness or clumsiness, but  denies any history of stroke.  He has no shortness of breath or chest  pain.  He reports nocturia about twice a night.  Until the last 2 weeks,  he  has been able to take weekly dance lessons and ride a stationary  bike, but recently has had generalized fatigue and his activity has been  which slower and has required some assistance.   SOCIAL HISTORY:  He has been divorced and then widowed and now has a  girlfriend who is 60 years old.  He has 3 children, 2 daughters and 1  son who has bipolar disorder.  He lives with his daughter, Duane Castaneda, in  [place].  He quit smoking in 1978, and he admits to drinking one  glass of brandy weekly on Saturday nights.  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   FAMILY HISTORY:  Father died of lung cancer, mother of leukemia.  He had  a brother with renal and eye cancer, and a sister with an abdominal  aortic aneurysm.  Again, his son is bipolar.   PHYSICAL EXAMINATION:  VITAL SIGNS:  Blood pressure 156/79, heart rate  65, respirations 18, temperature 97.9, oxygen saturation 97% on room  air.  GENERAL APPEARANCE:  This  is an alert and cooperative Caucasian male who  is no acute distress.  HEENT:  His head is normocephalic, atraumatic.  Pupils are equal, round,  react to light and accommodation.  Oral mucosa is pink and moist.  His  sclera are nonicteric.  He is nearly edentulous with only a few teeth on  his lower jaw.  NECK:  Supple.  No lymphadenopathy was noted.  He has palpable carotid  pulses but no bruits auscultated.  I was unable to definitively palpate  a right thyroid nodule, although it felt slightly full on the right.  RESPIRATORY:  Lung sounds are clear and symmetrical on inspiration.  CARDIAC:  His heart has a regular rate and rhythm.  No murmur, rub or  gallop was noted.  An EKG shows sinus rhythm with a right bundle branch  block.  ABDOMEN:  Soft without significant distension.  He does have some  tenderness on the left, both in his upper and lower quadrants.  Abdomen  felt fuller on the left.  He has no hepatomegaly noted.  He does have a  midline scar below his umbilicus which is well-healed.  He has  normoactive bowel sounds.  GU/RECTAL:  These exams were deferred.  EXTREMITIES:  Warm and dry without edema.  He has a tattoo on his left  upper arm.  He has 2+ dorsalis pedis, posterior tibial and radial  pulses.  NEUROLOGIC:  Grossly intact.  He is alert and oriented x4.  Speech is  clear.  His muscle strength is 5/5 in his upper and lower extremities,  although he does report some generalized weakness.   ASSESSMENT:  Right extensive abdominal lymphadenopathy and __________  mediastinal adenopathy, as well as right thyroid mass concerning for  lymphoma.   PLAN:  He will be admitted to [facility] under the care of Dr.  Algis Downs.  Karle Plumber.  He has asked the oncology team to see him during  this hospitalization.  He has also asked interventional radiology to  perform a needle biopsy of his right thyroid mass.  Following needle biopsy, Dr. Edwyna Shell anticipates that he will  place a Port-A-Cath on  December 05, 2006.      Jerold Coombe, P.A.    ______________________________  Ines Bloomer, M.D.    AWZ/MEDQ  D:  12/03/2006  T:  12/03/2006  Job:  [id number]

## 2011-05-13 ENCOUNTER — Encounter (HOSPITAL_COMMUNITY): Payer: Medicare Other

## 2011-05-16 ENCOUNTER — Encounter (HOSPITAL_COMMUNITY): Payer: Medicare Other | Attending: Oncology

## 2011-05-16 ENCOUNTER — Other Ambulatory Visit (HOSPITAL_COMMUNITY): Payer: Self-pay | Admitting: Oncology

## 2011-05-16 DIAGNOSIS — E039 Hypothyroidism, unspecified: Secondary | ICD-10-CM | POA: Insufficient documentation

## 2011-05-16 DIAGNOSIS — Z79899 Other long term (current) drug therapy: Secondary | ICD-10-CM | POA: Insufficient documentation

## 2011-05-16 DIAGNOSIS — C8589 Other specified types of non-Hodgkin lymphoma, extranodal and solid organ sites: Secondary | ICD-10-CM | POA: Insufficient documentation

## 2011-05-16 LAB — LACTATE DEHYDROGENASE: LDH: 151 U/L (ref 94–250)

## 2011-05-16 LAB — CBC
HCT: 35.7 % — ABNORMAL LOW (ref 39.0–52.0)
MCH: 29.3 pg (ref 26.0–34.0)
MCV: 87.9 fL (ref 78.0–100.0)
RBC: 4.06 MIL/uL — ABNORMAL LOW (ref 4.22–5.81)
WBC: 6.3 10*3/uL (ref 4.0–10.5)

## 2011-05-16 LAB — COMPREHENSIVE METABOLIC PANEL
Albumin: 3.6 g/dL (ref 3.5–5.2)
Alkaline Phosphatase: 98 U/L (ref 39–117)
BUN: 23 mg/dL (ref 6–23)
CO2: 29 mEq/L (ref 19–32)
Chloride: 102 mEq/L (ref 96–112)
GFR calc non Af Amer: 53 mL/min — ABNORMAL LOW (ref >60)
Glucose, Bld: 116 mg/dL — ABNORMAL HIGH (ref 70–99)
Potassium: 3.7 mEq/L (ref 3.5–5.1)
Total Bilirubin: 0.3 mg/dL (ref 0.3–1.2)

## 2011-05-16 LAB — DIFFERENTIAL
Lymphocytes Relative: 12 % (ref 12–46)
Lymphs Abs: 0.7 10*3/uL (ref 0.7–4.0)
Monocytes Relative: 9 % (ref 3–12)
Neutrophils Relative %: 76 % (ref 43–77)

## 2011-05-28 ENCOUNTER — Ambulatory Visit (HOSPITAL_COMMUNITY): Payer: Self-pay | Admitting: Oncology

## 2011-06-03 ENCOUNTER — Encounter (HOSPITAL_COMMUNITY): Payer: Medicare Other

## 2011-06-03 DIAGNOSIS — C8589 Other specified types of non-Hodgkin lymphoma, extranodal and solid organ sites: Secondary | ICD-10-CM

## 2011-06-26 ENCOUNTER — Encounter (HOSPITAL_COMMUNITY): Payer: Self-pay | Admitting: *Deleted

## 2011-06-27 ENCOUNTER — Encounter (HOSPITAL_COMMUNITY): Payer: Medicare Other

## 2011-07-02 ENCOUNTER — Encounter (HOSPITAL_COMMUNITY): Payer: Medicare Other | Attending: Oncology

## 2011-07-02 DIAGNOSIS — C8589 Other specified types of non-Hodgkin lymphoma, extranodal and solid organ sites: Secondary | ICD-10-CM | POA: Insufficient documentation

## 2011-07-02 DIAGNOSIS — E039 Hypothyroidism, unspecified: Secondary | ICD-10-CM | POA: Insufficient documentation

## 2011-07-02 DIAGNOSIS — Z452 Encounter for adjustment and management of vascular access device: Secondary | ICD-10-CM

## 2011-07-02 DIAGNOSIS — Z79899 Other long term (current) drug therapy: Secondary | ICD-10-CM | POA: Insufficient documentation

## 2011-07-02 MED ORDER — HEPARIN SOD (PORK) LOCK FLUSH 100 UNIT/ML IV SOLN
INTRAVENOUS | Status: AC
  Filled 2011-07-02: qty 5

## 2011-07-02 NOTE — Progress Notes (Signed)
Port accessed with no. 29 huber port needle.  Good blood return. Tolerated well.

## 2011-08-08 ENCOUNTER — Encounter (HOSPITAL_COMMUNITY): Payer: Medicare Other | Attending: Oncology

## 2011-08-08 DIAGNOSIS — Z79899 Other long term (current) drug therapy: Secondary | ICD-10-CM | POA: Insufficient documentation

## 2011-08-08 DIAGNOSIS — Z452 Encounter for adjustment and management of vascular access device: Secondary | ICD-10-CM

## 2011-08-08 DIAGNOSIS — E039 Hypothyroidism, unspecified: Secondary | ICD-10-CM | POA: Insufficient documentation

## 2011-08-08 DIAGNOSIS — C8589 Other specified types of non-Hodgkin lymphoma, extranodal and solid organ sites: Secondary | ICD-10-CM | POA: Insufficient documentation

## 2011-08-08 MED ORDER — HEPARIN SOD (PORK) LOCK FLUSH 100 UNIT/ML IV SOLN
500.0000 [IU] | Freq: Once | INTRAVENOUS | Status: AC
  Administered 2011-08-08: 500 [IU] via INTRAVENOUS
  Filled 2011-08-08: qty 5

## 2011-08-08 MED ORDER — HEPARIN SOD (PORK) LOCK FLUSH 100 UNIT/ML IV SOLN
INTRAVENOUS | Status: AC
  Administered 2011-08-08: 500 [IU] via INTRAVENOUS
  Filled 2011-08-08: qty 5

## 2011-08-08 MED ORDER — SODIUM CHLORIDE 0.9 % IJ SOLN
INTRAMUSCULAR | Status: AC
  Administered 2011-08-08: 10 mL via INTRAVENOUS
  Filled 2011-08-08: qty 10

## 2011-08-08 MED ORDER — SODIUM CHLORIDE 0.9 % IJ SOLN
10.0000 mL | Freq: Once | INTRAMUSCULAR | Status: AC
  Administered 2011-08-08: 10 mL via INTRAVENOUS
  Filled 2011-08-08: qty 10

## 2011-08-08 NOTE — Progress Notes (Signed)
Taysom L Dunlow presented for Portacath access and flush. Proper placement of portacath confirmed by CXR. Portacath located left chest wall accessed with  H 20 needle. Good blood return present. Portacath flushed with 20ml NS and 500U/5ml Heparin and needle removed intact. Procedure without incident. Patient tolerated procedure well.   

## 2011-08-30 LAB — CBC
HCT: 32.6 — ABNORMAL LOW
Platelets: 128 — ABNORMAL LOW
RDW: 14.8

## 2011-08-30 LAB — COMPREHENSIVE METABOLIC PANEL
Albumin: 3.4 — ABNORMAL LOW
Alkaline Phosphatase: 66
BUN: 24 — ABNORMAL HIGH
Calcium: 8.7
Potassium: 4.1
Total Protein: 5.8 — ABNORMAL LOW

## 2011-08-30 LAB — DIFFERENTIAL
Basophils Absolute: 0
Eosinophils Relative: 3
Lymphocytes Relative: 9 — ABNORMAL LOW
Lymphs Abs: 0.4 — ABNORMAL LOW
Neutro Abs: 2.9

## 2011-08-30 LAB — LACTATE DEHYDROGENASE: LDH: 157

## 2011-09-02 LAB — DIFFERENTIAL
Basophils Absolute: 0
Eosinophils Relative: 9 — ABNORMAL HIGH
Lymphocytes Relative: 27
Lymphs Abs: 0.3 — ABNORMAL LOW
Monocytes Absolute: 0.4
Monocytes Relative: 34 — ABNORMAL HIGH
Neutro Abs: 0.4 — ABNORMAL LOW

## 2011-09-02 LAB — CBC
HCT: 34 — ABNORMAL LOW
Hemoglobin: 11.7 — ABNORMAL LOW
RBC: 3.84 — ABNORMAL LOW
RDW: 14.9
WBC: 1.5 — ABNORMAL LOW

## 2011-09-02 LAB — PROTIME-INR
INR: 1
Prothrombin Time: 12.9

## 2011-09-03 ENCOUNTER — Ambulatory Visit (INDEPENDENT_AMBULATORY_CARE_PROVIDER_SITE_OTHER): Payer: Medicare Other | Admitting: Orthopedic Surgery

## 2011-09-03 ENCOUNTER — Encounter: Payer: Self-pay | Admitting: Orthopedic Surgery

## 2011-09-03 VITALS — BP 142/74 | Ht 70.0 in | Wt 200.0 lb

## 2011-09-03 DIAGNOSIS — M1710 Unilateral primary osteoarthritis, unspecified knee: Secondary | ICD-10-CM | POA: Insufficient documentation

## 2011-09-03 NOTE — Progress Notes (Signed)
Chief complaint RIGHT knee pain.  CC she will, male with osteoarthritis of the RIGHT knee Transferring care here from [place], [place]. Has osteoarthritis of the RIGHT knee. He has some pain. He notices that his walking has decreased.  The pain came on gradually although he was told he needed a knee replacement. 5 years ago. Pain is worse with walking, better with sitting. When walking. His pain is a 10.  Review of systems unsteady gait, joint pain, no heartburn, chest pain or blurred vision.  Exam vital signs are stable as recorded. General appearance is normal. Patient is oriented x3. Mood and affect are normal. He ambulates without assistance with a varus knee. He has tenderness on the medial compartment with varus deformity. Flexion equals 100 with flexion contracture 10. Knee is stable.  Knee was injected. Lateral approach. Knee  Injection Procedure Note  Pre-operative Diagnosis: right knee oa  Post-operative Diagnosis: same  Indications: pain  Anesthesia: ethyl chloride   Procedure Details   Verbal consent was obtained for the procedure. Time out was completed.The joint was prepped with alcohol, followed by  Ethyl chloride spray and A 20 gauge needle was inserted into the knee via lateral approach; 4ml 1% lidocaine and 1 ml of depomedrol  was then injected into the joint . The needle was removed and the area cleansed and dressed.  Complications:  None; patient tolerated the procedure well.  Patient can come back as needed.  Impression osteoarthritis, RIGHT knee.  Separate x-ray report  reason for x-ray pain, RIGHT knee. Findings: The knee is in varus. There is severe wear of the medial compartment, there bone to bone contact. Her multiple osteophytes.  Severe varus knee arthritis.

## 2011-09-03 NOTE — Patient Instructions (Signed)
You have received a steroid shot. 15% of patients experience increased pain at the injection site with in the next 24 hours. This is best treated with ice and tylenol extra strength 2 tabs every 8 hours. If you are still having pain please call the office.    

## 2011-09-04 LAB — DIFFERENTIAL
Basophils Absolute: 0
Basophils Relative: 0
Eosinophils Absolute: 0.2
Monocytes Absolute: 0.5
Monocytes Relative: 7
Neutrophils Relative %: 82 — ABNORMAL HIGH

## 2011-09-04 LAB — CBC
HCT: 35.8 — ABNORMAL LOW
Hemoglobin: 12.4 — ABNORMAL LOW
RBC: 4 — ABNORMAL LOW
RDW: 14.6
WBC: 6.7

## 2011-09-04 LAB — URINALYSIS, ROUTINE W REFLEX MICROSCOPIC
Bilirubin Urine: NEGATIVE
Glucose, UA: NEGATIVE
Ketones, ur: NEGATIVE
Protein, ur: NEGATIVE
pH: 7

## 2011-09-04 LAB — COMPREHENSIVE METABOLIC PANEL
ALT: 19
Alkaline Phosphatase: 82
BUN: 23
Chloride: 106
Glucose, Bld: 115 — ABNORMAL HIGH
Potassium: 4
Sodium: 141
Total Bilirubin: 0.8
Total Protein: 6.1

## 2011-09-04 LAB — PROTIME-INR: INR: 0.9

## 2011-09-05 LAB — DIFFERENTIAL
Basophils Absolute: 0
Eosinophils Relative: 3
Lymphocytes Relative: 9 — ABNORMAL LOW
Neutro Abs: 4
Neutrophils Relative %: 79 — ABNORMAL HIGH

## 2011-09-05 LAB — CBC
HCT: 32.5 — ABNORMAL LOW
Hemoglobin: 11.1 — ABNORMAL LOW
MCV: 89.7
RBC: 3.63 — ABNORMAL LOW
WBC: 5.1

## 2011-09-05 LAB — COMPREHENSIVE METABOLIC PANEL
AST: 20
BUN: 25 — ABNORMAL HIGH
CO2: 30
Chloride: 103
Creatinine, Ser: 1.25
GFR calc non Af Amer: 55 — ABNORMAL LOW
Glucose, Bld: 116 — ABNORMAL HIGH
Total Bilirubin: 0.7

## 2011-09-05 LAB — LACTATE DEHYDROGENASE: LDH: 139

## 2011-09-09 LAB — LACTATE DEHYDROGENASE: LDH: 129

## 2011-09-09 LAB — DIFFERENTIAL
Basophils Relative: 0
Eosinophils Absolute: 0.1
Monocytes Absolute: 0.4
Monocytes Relative: 8
Neutrophils Relative %: 82 — ABNORMAL HIGH

## 2011-09-09 LAB — CBC
Hemoglobin: 11.3 — ABNORMAL LOW
RDW: 15.1
WBC: 5.3

## 2011-09-09 LAB — COMPREHENSIVE METABOLIC PANEL
ALT: 19
Albumin: 3.6
Alkaline Phosphatase: 72
Glucose, Bld: 120 — ABNORMAL HIGH
Potassium: 4.1
Sodium: 139
Total Protein: 5.7 — ABNORMAL LOW

## 2011-09-16 LAB — DIFFERENTIAL
Basophils Relative: 1
Eosinophils Absolute: 0.2
Lymphs Abs: 0.4 — ABNORMAL LOW
Neutro Abs: 4.3
Neutrophils Relative %: 81 — ABNORMAL HIGH

## 2011-09-16 LAB — CBC
HCT: 32.8 — ABNORMAL LOW
Hemoglobin: 10.9 — ABNORMAL LOW
MCHC: 33.3
MCV: 93.1
RBC: 3.52 — ABNORMAL LOW

## 2011-09-16 LAB — COMPREHENSIVE METABOLIC PANEL
ALT: 16
BUN: 22
CO2: 26
Calcium: 8.3 — ABNORMAL LOW
GFR calc non Af Amer: 58 — ABNORMAL LOW
Glucose, Bld: 131 — ABNORMAL HIGH
Sodium: 138

## 2011-09-16 LAB — LACTATE DEHYDROGENASE: LDH: 118

## 2011-09-19 ENCOUNTER — Encounter (HOSPITAL_COMMUNITY): Payer: Medicare Other | Attending: Oncology

## 2011-09-19 DIAGNOSIS — C8589 Other specified types of non-Hodgkin lymphoma, extranodal and solid organ sites: Secondary | ICD-10-CM | POA: Insufficient documentation

## 2011-09-19 DIAGNOSIS — Z452 Encounter for adjustment and management of vascular access device: Secondary | ICD-10-CM

## 2011-09-19 DIAGNOSIS — C8590 Non-Hodgkin lymphoma, unspecified, unspecified site: Secondary | ICD-10-CM

## 2011-09-19 MED ORDER — HEPARIN SOD (PORK) LOCK FLUSH 100 UNIT/ML IV SOLN
500.0000 [IU] | Freq: Once | INTRAVENOUS | Status: AC
  Administered 2011-09-19: 500 [IU] via INTRAVENOUS
  Filled 2011-09-19: qty 5

## 2011-09-19 MED ORDER — HEPARIN SOD (PORK) LOCK FLUSH 100 UNIT/ML IV SOLN
INTRAVENOUS | Status: AC
  Administered 2011-09-19: 500 [IU] via INTRAVENOUS
  Filled 2011-09-19: qty 5

## 2011-09-19 MED ORDER — SODIUM CHLORIDE 0.9 % IJ SOLN
10.0000 mL | INTRAMUSCULAR | Status: DC | PRN
  Administered 2011-09-19: 10 mL via INTRAVENOUS
  Filled 2011-09-19: qty 10

## 2011-09-19 NOTE — Progress Notes (Signed)
Duane Castaneda presented for Portacath access and flush. Proper placement of portacath confirmed by CXR. Portacath located left chest wall accessed with  H 20 needle. Good blood return present. Portacath flushed with 20ml NS and 500U/23ml Heparin and needle removed intact. Procedure without incident. Patient tolerated procedure well.

## 2011-09-20 LAB — COMPREHENSIVE METABOLIC PANEL
ALT: 17
AST: 18
Albumin: 3.5
Alkaline Phosphatase: 72
BUN: 28 — ABNORMAL HIGH
CO2: 25
Calcium: 8.7
Chloride: 108
Creatinine, Ser: 1.47
GFR calc Af Amer: 55 — ABNORMAL LOW
GFR calc non Af Amer: 46 — ABNORMAL LOW
Glucose, Bld: 124 — ABNORMAL HIGH
Potassium: 4.1
Sodium: 139
Total Bilirubin: 0.7
Total Protein: 5.6 — ABNORMAL LOW

## 2011-09-20 LAB — CBC
HCT: 32.8 — ABNORMAL LOW
Hemoglobin: 11.4 — ABNORMAL LOW
MCHC: 34.7
MCV: 90.7
Platelets: 140 — ABNORMAL LOW
RBC: 3.62 — ABNORMAL LOW
RDW: 14.6 — ABNORMAL HIGH
WBC: 2.1 — ABNORMAL LOW

## 2011-09-20 LAB — DIFFERENTIAL
Basophils Absolute: 0
Basophils Relative: 1
Eosinophils Relative: 7 — ABNORMAL HIGH
Monocytes Absolute: 0.4

## 2011-09-20 LAB — LACTATE DEHYDROGENASE: LDH: 116

## 2011-09-25 LAB — LACTATE DEHYDROGENASE: LDH: 129

## 2011-09-25 LAB — COMPREHENSIVE METABOLIC PANEL
ALT: 14
BUN: 26 — ABNORMAL HIGH
CO2: 28
Calcium: 8.5
Chloride: 104
Creatinine, Ser: 1.13
GFR calc Af Amer: >60
GFR calc non Af Amer: >60
Glucose, Bld: 118 — ABNORMAL HIGH
Glucose, Bld: 141 — ABNORMAL HIGH
Sodium: 138
Total Bilirubin: 0.8
Total Protein: 4.9 — ABNORMAL LOW

## 2011-09-25 LAB — CBC
HCT: 29.5 — ABNORMAL LOW
Hemoglobin: 10.1 — ABNORMAL LOW
Hemoglobin: 9.2 — ABNORMAL LOW
MCHC: 35.6
MCV: 90.6
Platelets: 149 — ABNORMAL LOW
RBC: 3.25 — ABNORMAL LOW
RDW: 16.7 — ABNORMAL HIGH
WBC: 1.8 — ABNORMAL LOW

## 2011-09-25 LAB — DIFFERENTIAL
Basophils Relative: 0
Eosinophils Absolute: 0.2
Eosinophils Relative: 17 — ABNORMAL HIGH
Lymphs Abs: 0.3 — ABNORMAL LOW
Monocytes Absolute: 0.3
Monocytes Absolute: 0.5
Neutrophils Relative %: 47
Neutrophils Relative %: 49

## 2011-10-28 ENCOUNTER — Encounter (HOSPITAL_COMMUNITY): Payer: Medicare Other

## 2011-10-29 ENCOUNTER — Encounter (HOSPITAL_COMMUNITY): Payer: Medicare Other | Attending: Oncology

## 2011-10-29 DIAGNOSIS — Z452 Encounter for adjustment and management of vascular access device: Secondary | ICD-10-CM

## 2011-10-29 DIAGNOSIS — Z79899 Other long term (current) drug therapy: Secondary | ICD-10-CM | POA: Insufficient documentation

## 2011-10-29 DIAGNOSIS — E039 Hypothyroidism, unspecified: Secondary | ICD-10-CM | POA: Insufficient documentation

## 2011-10-29 DIAGNOSIS — C8589 Other specified types of non-Hodgkin lymphoma, extranodal and solid organ sites: Secondary | ICD-10-CM | POA: Insufficient documentation

## 2011-10-29 MED ORDER — HEPARIN SOD (PORK) LOCK FLUSH 100 UNIT/ML IV SOLN
INTRAVENOUS | Status: AC
  Filled 2011-10-29: qty 5

## 2011-10-29 MED ORDER — SODIUM CHLORIDE 0.9 % IJ SOLN
INTRAMUSCULAR | Status: AC
  Filled 2011-10-29: qty 10

## 2011-10-29 NOTE — Progress Notes (Signed)
Duane Castaneda presented for Portacath access and flush. Proper placement of portacath confirmed by CXR. Portacath located left chest wall accessed with  H 20 needle. Good blood return present. Portacath flushed with 20ml NS and 500U/5ml Heparin and needle removed intact. Procedure without incident. Patient tolerated procedure well.   

## 2011-10-30 ENCOUNTER — Encounter (HOSPITAL_COMMUNITY): Payer: Medicare Other

## 2011-11-27 ENCOUNTER — Encounter (HOSPITAL_COMMUNITY): Payer: Medicare Other | Attending: Oncology

## 2011-11-27 DIAGNOSIS — R7309 Other abnormal glucose: Secondary | ICD-10-CM | POA: Insufficient documentation

## 2011-11-27 DIAGNOSIS — C8590 Non-Hodgkin lymphoma, unspecified, unspecified site: Secondary | ICD-10-CM

## 2011-11-27 DIAGNOSIS — C8589 Other specified types of non-Hodgkin lymphoma, extranodal and solid organ sites: Secondary | ICD-10-CM

## 2011-11-27 DIAGNOSIS — Z452 Encounter for adjustment and management of vascular access device: Secondary | ICD-10-CM

## 2011-11-27 LAB — CBC
MCHC: 33.6 g/dL (ref 30.0–36.0)
MCV: 89.2 fL (ref 78.0–100.0)
Platelets: 130 10*3/uL — ABNORMAL LOW (ref 150–400)
RDW: 13.9 % (ref 11.5–15.5)
WBC: 6.9 10*3/uL (ref 4.0–10.5)

## 2011-11-27 LAB — COMPREHENSIVE METABOLIC PANEL
ALT: 11 U/L (ref 0–53)
AST: 14 U/L (ref 0–37)
Albumin: 3.6 g/dL (ref 3.5–5.2)
CO2: 27 mEq/L (ref 19–32)
Calcium: 9.3 mg/dL (ref 8.4–10.5)
Creatinine, Ser: 1.21 mg/dL (ref 0.50–1.35)
GFR calc non Af Amer: 52 mL/min — ABNORMAL LOW (ref >90)
Sodium: 140 mEq/L (ref 135–145)

## 2011-11-27 LAB — DIFFERENTIAL
Basophils Absolute: 0 10*3/uL (ref 0.0–0.1)
Basophils Relative: 0 % (ref 0–1)
Eosinophils Relative: 2 % (ref 0–5)
Lymphocytes Relative: 7 % — ABNORMAL LOW (ref 12–46)
Neutro Abs: 5.9 10*3/uL (ref 1.7–7.7)

## 2011-11-27 MED ORDER — ALTEPLASE 2 MG IJ SOLR
2.0000 mg | Freq: Once | INTRAMUSCULAR | Status: DC | PRN
  Filled 2011-11-27: qty 2

## 2011-11-27 MED ORDER — SODIUM CHLORIDE 0.9 % IJ SOLN
INTRAMUSCULAR | Status: AC
  Administered 2011-11-27: 10 mL via INTRAVENOUS
  Filled 2011-11-27: qty 10

## 2011-11-27 MED ORDER — SODIUM CHLORIDE 0.9 % IJ SOLN
10.0000 mL | INTRAMUSCULAR | Status: DC | PRN
  Administered 2011-11-27: 10 mL via INTRAVENOUS
  Filled 2011-11-27: qty 10

## 2011-11-27 MED ORDER — HEPARIN SOD (PORK) LOCK FLUSH 100 UNIT/ML IV SOLN
500.0000 [IU] | Freq: Once | INTRAVENOUS | Status: AC
  Administered 2011-11-27: 500 [IU] via INTRAVENOUS
  Filled 2011-11-27: qty 5

## 2011-11-27 MED ORDER — HEPARIN SOD (PORK) LOCK FLUSH 100 UNIT/ML IV SOLN
INTRAVENOUS | Status: AC
  Administered 2011-11-27: 500 [IU] via INTRAVENOUS
  Filled 2011-11-27: qty 5

## 2011-11-27 NOTE — Progress Notes (Signed)
Duane Castaneda presented for Portacath access and flush. Proper placement of portacath confirmed by CXR. Portacath located lt chest wall accessed with  H 20 needle. Good blood return present. Portacath flushed with 20ml NS and 500U/32ml Heparin and needle removed intact. Procedure without incident. Patient tolerated procedure well. Requests labs sent to [facility]

## 2011-11-28 ENCOUNTER — Encounter (HOSPITAL_COMMUNITY): Payer: Medicare Other

## 2011-12-06 ENCOUNTER — Encounter (HOSPITAL_BASED_OUTPATIENT_CLINIC_OR_DEPARTMENT_OTHER): Payer: Medicare Other | Admitting: Oncology

## 2011-12-06 VITALS — BP 159/67 | HR 75 | Temp 98.2°F | Ht 70.0 in | Wt 202.0 lb

## 2011-12-06 DIAGNOSIS — C8590 Non-Hodgkin lymphoma, unspecified, unspecified site: Secondary | ICD-10-CM

## 2011-12-06 DIAGNOSIS — R7309 Other abnormal glucose: Secondary | ICD-10-CM

## 2011-12-06 DIAGNOSIS — C8589 Other specified types of non-Hodgkin lymphoma, extranodal and solid organ sites: Secondary | ICD-10-CM

## 2011-12-06 NOTE — Progress Notes (Signed)
CC:   Duane Castaneda, M.D. Velora Heckler, MD Dr. Dario Guardian  DIAGNOSES: 1. Diffuse large B-cell lymphoma.  Presented with hypercalcemia and     extensive adenopathy within the chest and abdomen.  Biopsy-proven     disease on 12/04/2006, followed R-CHOP with a complete remission     established.  He has had no evidence for recurrence. 2. 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 Gerrit Friends without recurrence, and his     thyroid is being managed by Dr. Dario Guardian. 3. Colon cancer surgery in 1993 not requiring chemotherapy at that     time, but we do not have the stage. 4. History of gout. 5. Hypothyroidism on Synthroid per Dr. Dario Guardian. 6. Degenerative joint disease of the knees.  The left was replaced.     The right is still an issue. 7. Benign prostatic hypertrophy, status post transurethral resection     of the prostate in the past by Dr. Jerre Simon. 8. Right hand skin cancer followed by Dr. Suan Halter. 9. Detached retinas in the past as well as bilateral cataract     operations with lens implants. Tramain does not have any B symptomatology.  He still urinates frequently about every 3 hours through the night, so about 2 times during the night he urinates.  That is not really new or different.  He is not aware of anything else on review of systems.  His labs the other day showed a sugar of 161 approximately, and he did eat breakfast that day so it was not fasting.  So I will bring him back in a couple of weeks with a fasting sugar and a hemoglobin A1c to play it safe.  Other than that, he feels good.  His vital signs show a weight of 202 pounds, which is pretty stable for him.  He is 5 feet 10 inches tall. BMI of 29.  Blood pressure 159/67, pulse 76 and regular, respirations 16 to 18 and unlabored.  He is afebrile.  He still has some discomfort in his knees.  His lymph nodes are negative throughout including cervical, supraclavicular,  infraclavicular, axillary and inguinal areas.  He does not have any epitrochlear nodes.  Lungs:  Clear.  Heart:  Shows a regular rhythm and rate without a murmur or gallop.  He has no ankle edema.  Abdomen:  Soft, obese, nontender, without organomegaly.  I think he is doing well.  I do not think we need to do any restaging studies presently.  We will see him back in 6 months and sooner if need be.  His daughter was with him today.  She is actually having vascular problems herself.  So I answered her questions and his questions, but he really is doing well, still in a CR.    ______________________________ Ladona Horns. Mariel Sleet, MD ESN/MEDQ  D:  12/06/2011  T:  12/06/2011  Job:  [id number]

## 2011-12-06 NOTE — Progress Notes (Signed)
This office note has been dictated.

## 2011-12-06 NOTE — Patient Instructions (Signed)
[facility]  Discharge Instructions  Duane Castaneda  [id number] Nov 09, 1923   RECOMMENDATIONS MADE BY THE CONSULTANT AND ANY TEST RESULTS WILL BE SENT TO YOUR REFERRING DOCTOR.   EXAM FINDINGS BY MD TODAY AND SIGNS AND SYMPTOMS TO REPORT TO CLINIC OR PRIMARY MD: Exam findings good as discussed by Dr. Mariel Sleet.  Your blood sugar was elevated, so please return in 1/14 for additional lab tests.  When you return for lab work, you need to have had nothing to eat as of midnight the night before.  I acknowledge that I have been informed and understand all the instructions given to me and received a copy. I do not have any more questions at this time, but understand that I may call the Specialty Clinic at [facility] at [phone] during business hours should I have any further questions or need assistance in obtaining follow-up care.    __________________________________________  _____________  __________ Signature of Patient or Authorized Representative            Date                   Time    __________________________________________ Nurse's Signature

## 2011-12-23 ENCOUNTER — Other Ambulatory Visit (HOSPITAL_COMMUNITY): Payer: Medicare Other

## 2011-12-27 ENCOUNTER — Encounter (HOSPITAL_COMMUNITY): Payer: Medicare Other | Attending: Oncology

## 2011-12-27 ENCOUNTER — Other Ambulatory Visit (HOSPITAL_COMMUNITY): Payer: Medicare Other

## 2011-12-27 DIAGNOSIS — R7309 Other abnormal glucose: Secondary | ICD-10-CM | POA: Diagnosis not present

## 2011-12-27 DIAGNOSIS — C8590 Non-Hodgkin lymphoma, unspecified, unspecified site: Secondary | ICD-10-CM

## 2011-12-27 DIAGNOSIS — C8589 Other specified types of non-Hodgkin lymphoma, extranodal and solid organ sites: Secondary | ICD-10-CM | POA: Diagnosis not present

## 2011-12-27 LAB — HEMOGLOBIN A1C: Hgb A1c MFr Bld: 6.1 % — ABNORMAL HIGH (ref <5.7)

## 2011-12-27 LAB — GLUCOSE, RANDOM: Glucose, Bld: 112 mg/dL — ABNORMAL HIGH (ref 70–99)

## 2011-12-27 NOTE — Progress Notes (Signed)
Labs drawn today for hga1c and glucose fasting

## 2011-12-30 ENCOUNTER — Telehealth (HOSPITAL_COMMUNITY): Payer: Self-pay

## 2011-12-30 NOTE — Telephone Encounter (Signed)
Message left that Hgb. A1c was 6.1.

## 2011-12-30 NOTE — Telephone Encounter (Signed)
Message copied by Sterling Big on Mon Dec 30, 2011 10:08 AM ------      Message from: Mariel Sleet, ERIC S      Created: Mon Dec 30, 2011  9:56 AM       Great-call him.

## 2012-01-08 ENCOUNTER — Encounter (HOSPITAL_COMMUNITY): Payer: Medicare Other

## 2012-01-10 ENCOUNTER — Encounter (HOSPITAL_COMMUNITY): Payer: Medicare Other | Attending: Oncology

## 2012-01-10 DIAGNOSIS — Z452 Encounter for adjustment and management of vascular access device: Secondary | ICD-10-CM

## 2012-01-10 DIAGNOSIS — C8589 Other specified types of non-Hodgkin lymphoma, extranodal and solid organ sites: Secondary | ICD-10-CM

## 2012-01-10 DIAGNOSIS — M1710 Unilateral primary osteoarthritis, unspecified knee: Secondary | ICD-10-CM | POA: Insufficient documentation

## 2012-01-10 DIAGNOSIS — IMO0002 Reserved for concepts with insufficient information to code with codable children: Secondary | ICD-10-CM | POA: Insufficient documentation

## 2012-01-10 MED ORDER — HEPARIN SOD (PORK) LOCK FLUSH 100 UNIT/ML IV SOLN
INTRAVENOUS | Status: AC
  Administered 2012-01-10: 500 [IU] via INTRAVENOUS
  Filled 2012-01-10: qty 5

## 2012-01-10 MED ORDER — HEPARIN SOD (PORK) LOCK FLUSH 100 UNIT/ML IV SOLN
500.0000 [IU] | Freq: Once | INTRAVENOUS | Status: AC
  Administered 2012-01-10: 500 [IU] via INTRAVENOUS
  Filled 2012-01-10: qty 5

## 2012-01-10 MED ORDER — SODIUM CHLORIDE 0.9 % IN NEBU
INHALATION_SOLUTION | RESPIRATORY_TRACT | Status: AC
  Filled 2012-01-10: qty 3

## 2012-01-10 MED ORDER — SODIUM CHLORIDE 0.9 % IJ SOLN
10.0000 mL | INTRAMUSCULAR | Status: DC | PRN
  Administered 2012-01-10: 10 mL via INTRAVENOUS
  Filled 2012-01-10: qty 10

## 2012-01-10 MED ORDER — SODIUM CHLORIDE 0.9 % IJ SOLN
INTRAMUSCULAR | Status: AC
  Administered 2012-01-10: 10 mL via INTRAVENOUS
  Filled 2012-01-10: qty 10

## 2012-01-10 NOTE — Progress Notes (Signed)
Tolerated port flush well. 

## 2012-01-29 DIAGNOSIS — E039 Hypothyroidism, unspecified: Secondary | ICD-10-CM | POA: Diagnosis not present

## 2012-02-21 ENCOUNTER — Encounter (HOSPITAL_COMMUNITY): Payer: Medicare Other | Attending: Oncology

## 2012-02-21 DIAGNOSIS — IMO0002 Reserved for concepts with insufficient information to code with codable children: Secondary | ICD-10-CM | POA: Insufficient documentation

## 2012-02-21 DIAGNOSIS — M1710 Unilateral primary osteoarthritis, unspecified knee: Secondary | ICD-10-CM

## 2012-02-21 DIAGNOSIS — C8589 Other specified types of non-Hodgkin lymphoma, extranodal and solid organ sites: Secondary | ICD-10-CM

## 2012-02-21 DIAGNOSIS — Z452 Encounter for adjustment and management of vascular access device: Secondary | ICD-10-CM | POA: Diagnosis not present

## 2012-02-21 MED ORDER — HEPARIN SOD (PORK) LOCK FLUSH 100 UNIT/ML IV SOLN
500.0000 [IU] | Freq: Once | INTRAVENOUS | Status: AC
  Administered 2012-02-21: 500 [IU] via INTRAVENOUS
  Filled 2012-02-21: qty 5

## 2012-02-21 MED ORDER — SODIUM CHLORIDE 0.9 % IJ SOLN
10.0000 mL | INTRAMUSCULAR | Status: DC | PRN
  Administered 2012-02-21: 10 mL via INTRAVENOUS
  Filled 2012-02-21: qty 10

## 2012-02-21 MED ORDER — SODIUM CHLORIDE 0.9 % IJ SOLN
INTRAMUSCULAR | Status: AC
  Administered 2012-02-21: 10 mL via INTRAVENOUS
  Filled 2012-02-21: qty 10

## 2012-02-21 MED ORDER — HEPARIN SOD (PORK) LOCK FLUSH 100 UNIT/ML IV SOLN
INTRAVENOUS | Status: AC
  Administered 2012-02-21: 500 [IU] via INTRAVENOUS
  Filled 2012-02-21: qty 5

## 2012-02-21 NOTE — Progress Notes (Signed)
Tolerated port flush well. 

## 2012-04-02 ENCOUNTER — Encounter (HOSPITAL_COMMUNITY): Payer: Medicare Other

## 2012-04-08 DIAGNOSIS — E049 Nontoxic goiter, unspecified: Secondary | ICD-10-CM | POA: Diagnosis not present

## 2012-04-13 ENCOUNTER — Encounter (HOSPITAL_COMMUNITY): Payer: Medicare Other | Attending: Oncology

## 2012-04-13 DIAGNOSIS — Z452 Encounter for adjustment and management of vascular access device: Secondary | ICD-10-CM | POA: Diagnosis not present

## 2012-04-13 DIAGNOSIS — C8589 Other specified types of non-Hodgkin lymphoma, extranodal and solid organ sites: Secondary | ICD-10-CM

## 2012-04-13 DIAGNOSIS — Z95828 Presence of other vascular implants and grafts: Secondary | ICD-10-CM

## 2012-04-13 DIAGNOSIS — Z9889 Other specified postprocedural states: Secondary | ICD-10-CM | POA: Insufficient documentation

## 2012-04-13 MED ORDER — HEPARIN SOD (PORK) LOCK FLUSH 100 UNIT/ML IV SOLN
500.0000 [IU] | Freq: Once | INTRAVENOUS | Status: DC
  Filled 2012-04-13: qty 5

## 2012-04-13 MED ORDER — HEPARIN SOD (PORK) LOCK FLUSH 100 UNIT/ML IV SOLN
INTRAVENOUS | Status: AC
  Filled 2012-04-13: qty 5

## 2012-04-13 MED ORDER — SODIUM CHLORIDE 0.9 % IJ SOLN
10.0000 mL | INTRAMUSCULAR | Status: DC | PRN
  Filled 2012-04-13: qty 10

## 2012-04-13 MED ORDER — SODIUM CHLORIDE 0.9 % IJ SOLN
INTRAMUSCULAR | Status: AC
  Filled 2012-04-13: qty 10

## 2012-04-13 NOTE — Progress Notes (Signed)
Duane Castaneda presented for Portacath access and flush. Proper placement of portacath confirmed by CXR. Portacath located lt chest wall accessed with  H 20 needle. Good blood return present. Portacath flushed with 20ml NS and 500U/5ml Heparin and needle removed intact. Procedure without incident. Patient tolerated procedure well.   

## 2012-05-01 DIAGNOSIS — E039 Hypothyroidism, unspecified: Secondary | ICD-10-CM | POA: Diagnosis not present

## 2012-05-29 ENCOUNTER — Encounter (HOSPITAL_COMMUNITY): Payer: Medicare Other | Attending: Oncology

## 2012-05-29 DIAGNOSIS — C8590 Non-Hodgkin lymphoma, unspecified, unspecified site: Secondary | ICD-10-CM

## 2012-05-29 DIAGNOSIS — C8589 Other specified types of non-Hodgkin lymphoma, extranodal and solid organ sites: Secondary | ICD-10-CM | POA: Diagnosis not present

## 2012-05-29 DIAGNOSIS — M179 Osteoarthritis of knee, unspecified: Secondary | ICD-10-CM

## 2012-05-29 DIAGNOSIS — M1710 Unilateral primary osteoarthritis, unspecified knee: Secondary | ICD-10-CM

## 2012-05-29 DIAGNOSIS — IMO0002 Reserved for concepts with insufficient information to code with codable children: Secondary | ICD-10-CM | POA: Diagnosis not present

## 2012-05-29 DIAGNOSIS — R7309 Other abnormal glucose: Secondary | ICD-10-CM | POA: Diagnosis not present

## 2012-05-29 LAB — COMPREHENSIVE METABOLIC PANEL
Albumin: 3.4 g/dL — ABNORMAL LOW (ref 3.5–5.2)
Alkaline Phosphatase: 90 U/L (ref 39–117)
BUN: 24 mg/dL — ABNORMAL HIGH (ref 6–23)
Creatinine, Ser: 1.22 mg/dL (ref 0.50–1.35)
GFR calc Af Amer: 60 mL/min — ABNORMAL LOW (ref >90)
Glucose, Bld: 72 mg/dL (ref 70–99)
Potassium: 3.9 mEq/L (ref 3.5–5.1)
Total Bilirubin: 0.3 mg/dL (ref 0.3–1.2)
Total Protein: 5.8 g/dL — ABNORMAL LOW (ref 6.0–8.3)

## 2012-05-29 LAB — CBC
MCHC: 33.4 g/dL (ref 30.0–36.0)
Platelets: 114 10*3/uL — ABNORMAL LOW (ref 150–400)
RDW: 14.3 % (ref 11.5–15.5)
WBC: 6.9 10*3/uL (ref 4.0–10.5)

## 2012-05-29 LAB — LACTATE DEHYDROGENASE: LDH: 151 U/L (ref 94–250)

## 2012-05-29 LAB — DIFFERENTIAL
Basophils Absolute: 0 10*3/uL (ref 0.0–0.1)
Basophils Relative: 1 % (ref 0–1)
Eosinophils Relative: 3 % (ref 0–5)
Lymphocytes Relative: 12 % (ref 12–46)
Neutro Abs: 5.3 10*3/uL (ref 1.7–7.7)

## 2012-05-29 MED ORDER — SODIUM CHLORIDE 0.9 % IJ SOLN
INTRAMUSCULAR | Status: AC
  Filled 2012-05-29: qty 10

## 2012-05-29 MED ORDER — SODIUM CHLORIDE 0.9 % IJ SOLN
10.0000 mL | INTRAMUSCULAR | Status: DC | PRN
  Administered 2012-05-29: 10 mL via INTRAVENOUS
  Filled 2012-05-29: qty 10

## 2012-05-29 MED ORDER — HEPARIN SOD (PORK) LOCK FLUSH 100 UNIT/ML IV SOLN
INTRAVENOUS | Status: AC
  Filled 2012-05-29: qty 5

## 2012-05-29 MED ORDER — HEPARIN SOD (PORK) LOCK FLUSH 100 UNIT/ML IV SOLN
500.0000 [IU] | Freq: Once | INTRAVENOUS | Status: AC
  Administered 2012-05-29: 500 [IU] via INTRAVENOUS
  Filled 2012-05-29: qty 5

## 2012-05-29 NOTE — Progress Notes (Signed)
Tolerated port flush well.  Good blood return. 

## 2012-06-01 ENCOUNTER — Other Ambulatory Visit (HOSPITAL_COMMUNITY): Payer: Medicare Other

## 2012-06-05 ENCOUNTER — Encounter (HOSPITAL_BASED_OUTPATIENT_CLINIC_OR_DEPARTMENT_OTHER): Payer: Medicare Other | Admitting: Oncology

## 2012-06-05 VITALS — BP 178/82 | HR 66 | Temp 97.7°F | Ht 70.0 in | Wt 197.7 lb

## 2012-06-05 DIAGNOSIS — E890 Postprocedural hypothyroidism: Secondary | ICD-10-CM

## 2012-06-05 DIAGNOSIS — M1710 Unilateral primary osteoarthritis, unspecified knee: Secondary | ICD-10-CM

## 2012-06-05 DIAGNOSIS — Z85038 Personal history of other malignant neoplasm of large intestine: Secondary | ICD-10-CM

## 2012-06-05 DIAGNOSIS — C8589 Other specified types of non-Hodgkin lymphoma, extranodal and solid organ sites: Secondary | ICD-10-CM | POA: Diagnosis not present

## 2012-06-05 DIAGNOSIS — C8330 Diffuse large B-cell lymphoma, unspecified site: Secondary | ICD-10-CM

## 2012-06-05 NOTE — Progress Notes (Signed)
Problem #1 diffuse large B-cell lymphoma presenting with hypercalcemia and extensive adenopathy within the chest and abdomen. He presented with biopsy-proven disease on 12/04/2006 treated with R. CHOP chemotherapy with a complete remission. He remains in remission.  Problem #2 thyroid follicular carcinoma which is a variant of papillary thyroid carcinoma status post total thyroidectomy by Dr. Darnell Level  Problem #3 history of colon cancer in 1993 status post resection  Problem #4 hypothyroidism on Synthroid  Problem #5 degenerative joint disease of the knees the left was replaced with an excellent result in the right is very painful and he limps when he walks. He is not considered a candidate for surgery tells me.  Problem #6 detached retinas in the past as well as bilateral cataract operations with lens implants.  He is accompanied by his daughter but still is doing well except for the right knee arthritis. He has no B. symptomatology. He looks great vital signs are stable today. He once again has no adenopathy in any location lungs are clear. Heart shows a regular rhythm and rate without murmur rub or gallop. His abdomen remains soft nontender with normal bowel sounds and no hepatosplenomegaly. He has trace edema both pretibial areas. His right knee is slightly swollen compared to the left. He is wearing a brace on his right knee. His Port-A-Cath remains intact. He is alert he is oriented he is slightly hard. Which is not new or different. We will continue to watch him with blood work and a physical exam every 6 months. I will see him in December

## 2012-06-05 NOTE — Patient Instructions (Addendum)
Duane Castaneda  [id number] March 12, 1923 Dr. Glenford Peers   [facility]  Discharge Instructions  RECOMMENDATIONS MADE BY THE CONSULTANT AND ANY TEST RESULTS WILL BE SENT TO YOUR REFERRING DOCTOR.   EXAM FINDINGS BY MD TODAY AND SIGNS AND SYMPTOMS TO REPORT TO CLINIC OR PRIMARY MD: Exam and discussion per MD.  You are doing well.  Report any new lumps, shortness of breath, night sweats, fevers, recurring infections.  MEDICATIONS PRESCRIBED: none      SPECIAL INSTRUCTIONS/FOLLOW-UP: Lab work Needed in December and Return to Clinic on after labs.   I acknowledge that I have been informed and understand all the instructions given to me and received a copy. I do not have any more questions at this time, but understand that I may call the Specialty Clinic at [facility] at [phone] during business hours should I have any further questions or need assistance in obtaining follow-up care.    __________________________________________  _____________  __________ Signature of Patient or Authorized Representative            Date                   Time    __________________________________________ Nurse's Signature

## 2012-07-10 ENCOUNTER — Encounter (HOSPITAL_COMMUNITY): Payer: Medicare Other | Attending: Oncology

## 2012-07-10 DIAGNOSIS — Z452 Encounter for adjustment and management of vascular access device: Secondary | ICD-10-CM | POA: Diagnosis not present

## 2012-07-10 DIAGNOSIS — C8589 Other specified types of non-Hodgkin lymphoma, extranodal and solid organ sites: Secondary | ICD-10-CM

## 2012-07-10 DIAGNOSIS — Z95828 Presence of other vascular implants and grafts: Secondary | ICD-10-CM

## 2012-07-10 DIAGNOSIS — Z9889 Other specified postprocedural states: Secondary | ICD-10-CM | POA: Insufficient documentation

## 2012-07-10 MED ORDER — HEPARIN SOD (PORK) LOCK FLUSH 100 UNIT/ML IV SOLN
500.0000 [IU] | Freq: Once | INTRAVENOUS | Status: AC
  Administered 2012-07-10: 500 [IU] via INTRAVENOUS
  Filled 2012-07-10: qty 5

## 2012-07-10 MED ORDER — HEPARIN SOD (PORK) LOCK FLUSH 100 UNIT/ML IV SOLN
INTRAVENOUS | Status: AC
  Filled 2012-07-10: qty 5

## 2012-07-10 MED ORDER — SODIUM CHLORIDE 0.9 % IJ SOLN
10.0000 mL | INTRAMUSCULAR | Status: DC | PRN
  Administered 2012-07-10: 10 mL via INTRAVENOUS
  Filled 2012-07-10: qty 10

## 2012-07-10 NOTE — Progress Notes (Signed)
Duane Castaneda presented for Portacath access and flush. Proper placement of portacath confirmed by CXR. Portacath located rt chest wall accessed with  H 20 needle. Portacath flushed with 20ml NS and 500U/60ml Heparin and needle removed intact. Procedure without incident. Patient tolerated procedure well.

## 2012-08-04 DIAGNOSIS — E039 Hypothyroidism, unspecified: Secondary | ICD-10-CM | POA: Diagnosis not present

## 2012-08-21 ENCOUNTER — Encounter (HOSPITAL_COMMUNITY): Payer: Self-pay

## 2012-08-21 ENCOUNTER — Encounter (HOSPITAL_COMMUNITY): Payer: Medicare Other | Attending: Oncology

## 2012-08-21 DIAGNOSIS — Z452 Encounter for adjustment and management of vascular access device: Secondary | ICD-10-CM | POA: Diagnosis not present

## 2012-08-21 DIAGNOSIS — Z95828 Presence of other vascular implants and grafts: Secondary | ICD-10-CM

## 2012-08-21 DIAGNOSIS — C8589 Other specified types of non-Hodgkin lymphoma, extranodal and solid organ sites: Secondary | ICD-10-CM | POA: Diagnosis not present

## 2012-08-21 DIAGNOSIS — Z9889 Other specified postprocedural states: Secondary | ICD-10-CM | POA: Insufficient documentation

## 2012-08-21 HISTORY — DX: Presence of other vascular implants and grafts: Z95.828

## 2012-08-21 MED ORDER — HEPARIN SOD (PORK) LOCK FLUSH 100 UNIT/ML IV SOLN
INTRAVENOUS | Status: AC
  Filled 2012-08-21: qty 5

## 2012-08-21 MED ORDER — HEPARIN SOD (PORK) LOCK FLUSH 100 UNIT/ML IV SOLN
500.0000 [IU] | Freq: Once | INTRAVENOUS | Status: AC
  Administered 2012-08-21: 500 [IU] via INTRAVENOUS
  Filled 2012-08-21: qty 5

## 2012-08-21 MED ORDER — SODIUM CHLORIDE 0.9 % IJ SOLN
10.0000 mL | INTRAMUSCULAR | Status: DC | PRN
  Administered 2012-08-21: 10 mL via INTRAVENOUS
  Filled 2012-08-21: qty 10

## 2012-08-21 MED ORDER — SODIUM CHLORIDE 0.9 % IJ SOLN
INTRAMUSCULAR | Status: AC
  Filled 2012-08-21: qty 10

## 2012-08-21 NOTE — Progress Notes (Signed)
Tolerated port flush well. 

## 2012-09-08 DIAGNOSIS — Z23 Encounter for immunization: Secondary | ICD-10-CM | POA: Diagnosis not present

## 2012-10-02 ENCOUNTER — Encounter (HOSPITAL_COMMUNITY): Payer: Medicare Other | Attending: Oncology

## 2012-10-02 DIAGNOSIS — Z452 Encounter for adjustment and management of vascular access device: Secondary | ICD-10-CM | POA: Diagnosis not present

## 2012-10-02 DIAGNOSIS — C8589 Other specified types of non-Hodgkin lymphoma, extranodal and solid organ sites: Secondary | ICD-10-CM | POA: Diagnosis not present

## 2012-10-02 DIAGNOSIS — Z9889 Other specified postprocedural states: Secondary | ICD-10-CM | POA: Insufficient documentation

## 2012-10-02 MED ORDER — HEPARIN SOD (PORK) LOCK FLUSH 100 UNIT/ML IV SOLN
500.0000 [IU] | Freq: Once | INTRAVENOUS | Status: AC
  Administered 2012-10-02: 500 [IU] via INTRAVENOUS
  Filled 2012-10-02: qty 5

## 2012-10-02 MED ORDER — SODIUM CHLORIDE 0.9 % IJ SOLN
10.0000 mL | INTRAMUSCULAR | Status: DC | PRN
  Administered 2012-10-02: 10 mL via INTRAVENOUS
  Filled 2012-10-02: qty 10

## 2012-10-02 MED ORDER — HEPARIN SOD (PORK) LOCK FLUSH 100 UNIT/ML IV SOLN
INTRAVENOUS | Status: AC
  Filled 2012-10-02: qty 5

## 2012-10-02 NOTE — Progress Notes (Signed)
Duane Castaneda presented for Portacath access and flush.  Proper placement of portacath confirmed by CXR.  Portacath located left chest wall accessed with  H 20 needle.  Portacath flushed with 20ml NS and 500U/47ml Heparin and needle removed intact.  Procedure without incident.  Patient tolerated procedure well.

## 2012-11-04 DIAGNOSIS — E039 Hypothyroidism, unspecified: Secondary | ICD-10-CM | POA: Diagnosis not present

## 2012-11-13 ENCOUNTER — Encounter (HOSPITAL_COMMUNITY): Payer: Medicare Other | Attending: Oncology

## 2012-11-13 DIAGNOSIS — Z452 Encounter for adjustment and management of vascular access device: Secondary | ICD-10-CM | POA: Diagnosis not present

## 2012-11-13 DIAGNOSIS — C8589 Other specified types of non-Hodgkin lymphoma, extranodal and solid organ sites: Secondary | ICD-10-CM | POA: Diagnosis not present

## 2012-11-13 DIAGNOSIS — C8330 Diffuse large B-cell lymphoma, unspecified site: Secondary | ICD-10-CM

## 2012-11-13 LAB — COMPREHENSIVE METABOLIC PANEL
BUN: 23 mg/dL (ref 6–23)
CO2: 26 mEq/L (ref 19–32)
Calcium: 9.3 mg/dL (ref 8.4–10.5)
Chloride: 102 mEq/L (ref 96–112)
Creatinine, Ser: 1.5 mg/dL — ABNORMAL HIGH (ref 0.50–1.35)
GFR calc non Af Amer: 40 mL/min — ABNORMAL LOW (ref >90)
Total Bilirubin: 0.3 mg/dL (ref 0.3–1.2)

## 2012-11-13 LAB — DIFFERENTIAL
Basophils Absolute: 0 10*3/uL (ref 0.0–0.1)
Basophils Relative: 1 % (ref 0–1)
Eosinophils Relative: 3 % (ref 0–5)
Monocytes Absolute: 0.5 10*3/uL (ref 0.1–1.0)
Monocytes Relative: 8 % (ref 3–12)
Neutro Abs: 4.7 10*3/uL (ref 1.7–7.7)

## 2012-11-13 LAB — CBC
HCT: 37.1 % — ABNORMAL LOW (ref 39.0–52.0)
Hemoglobin: 12.5 g/dL — ABNORMAL LOW (ref 13.0–17.0)
MCHC: 33.7 g/dL (ref 30.0–36.0)
MCV: 88.1 fL (ref 78.0–100.0)
RDW: 14.5 % (ref 11.5–15.5)

## 2012-11-13 LAB — LACTATE DEHYDROGENASE: LDH: 203 U/L (ref 94–250)

## 2012-11-13 MED ORDER — HEPARIN SOD (PORK) LOCK FLUSH 100 UNIT/ML IV SOLN
500.0000 [IU] | Freq: Once | INTRAVENOUS | Status: AC
  Administered 2012-11-13: 500 [IU] via INTRAVENOUS
  Filled 2012-11-13: qty 5

## 2012-11-13 MED ORDER — HEPARIN SOD (PORK) LOCK FLUSH 100 UNIT/ML IV SOLN
INTRAVENOUS | Status: AC
  Filled 2012-11-13: qty 5

## 2012-11-13 NOTE — Progress Notes (Signed)
Duane Castaneda presented for Portacath access and flush. Proper placement of portacath confirmed by CXR. Portacath located  chest wall accessed with  H 20 needle. Good blood return present. Portacath flushed with 20ml NS and 500U/85ml Heparin and needle removed intact. Procedure without incident. Patient tolerated procedure well.

## 2012-11-24 DIAGNOSIS — H3530 Unspecified macular degeneration: Secondary | ICD-10-CM | POA: Diagnosis not present

## 2012-11-24 DIAGNOSIS — H348392 Tributary (branch) retinal vein occlusion, unspecified eye, stable: Secondary | ICD-10-CM | POA: Diagnosis not present

## 2012-11-24 DIAGNOSIS — Z961 Presence of intraocular lens: Secondary | ICD-10-CM | POA: Diagnosis not present

## 2012-12-08 ENCOUNTER — Other Ambulatory Visit (HOSPITAL_COMMUNITY): Payer: Medicare Other

## 2012-12-11 ENCOUNTER — Encounter (HOSPITAL_COMMUNITY): Payer: Medicare Other | Attending: Oncology | Admitting: Oncology

## 2012-12-11 ENCOUNTER — Encounter (HOSPITAL_COMMUNITY): Payer: Self-pay | Admitting: Oncology

## 2012-12-11 VITALS — BP 160/71 | HR 70 | Temp 97.3°F | Resp 18 | Wt 200.0 lb

## 2012-12-11 DIAGNOSIS — C8589 Other specified types of non-Hodgkin lymphoma, extranodal and solid organ sites: Secondary | ICD-10-CM | POA: Diagnosis not present

## 2012-12-11 DIAGNOSIS — Z9889 Other specified postprocedural states: Secondary | ICD-10-CM | POA: Insufficient documentation

## 2012-12-11 DIAGNOSIS — Z85038 Personal history of other malignant neoplasm of large intestine: Secondary | ICD-10-CM | POA: Diagnosis not present

## 2012-12-11 DIAGNOSIS — C8590 Non-Hodgkin lymphoma, unspecified, unspecified site: Secondary | ICD-10-CM

## 2012-12-11 NOTE — Patient Instructions (Addendum)
[facility] Discharge Instructions  RECOMMENDATIONS MADE BY THE CONSULTANT AND ANY TEST RESULTS WILL BE SENT TO YOUR REFERRING PHYSICIAN.  EXAM FINDINGS BY THE PHYSICIAN TODAY AND SIGNS OR SYMPTOMS TO REPORT TO CLINIC OR PRIMARY PHYSICIAN: exam and discussion by MD.  Blood work was stable.  We will get cardiology consult and get them to make sure you are ok for a knee replacement.  Once they clear you we will contact Dr. Romeo Apple.  MEDICATIONS PRESCRIBED:  none  INSTRUCTIONS GIVEN AND DISCUSSED: Report night sweats, fevers, recurring infections,etc.  SPECIAL INSTRUCTIONS/FOLLOW-UP: In 6 months after blood work.  Thank you for choosing [facility] to provide your oncology and hematology care.  To afford each patient quality time with our providers, please arrive at least 15 minutes before your scheduled appointment time.  With your help, our goal is to use those 15 minutes to complete the necessary work-up to ensure our physicians have the information they need to help with your evaluation and healthcare recommendations.    Effective January 1st, 2014, we ask that you re-schedule your appointment with our physicians should you arrive 10 or more minutes late for your appointment.  We strive to give you quality time with our providers, and arriving late affects you and other patients whose appointments are after yours.    Again, thank you for choosing [facility].  Our hope is that these requests will decrease the amount of time that you wait before being seen by our physicians.       _____________________________________________________________  I acknowledge that I have been informed and understand all the instructions given to me and received a copy. I do not have anymore questions at this time but understand that I may call the Cancer Center at [facility] at [phone] during business hours should I have any further questions or need  assistance in obtaining follow-up care.

## 2012-12-11 NOTE — Progress Notes (Signed)
Problem number 1 diffuse large B-cell lymphoma presenting with hypercalcemia and extensive adenopathy within the chest and abdomen. He presented with biopsy-proven disease on 12/04/2006 and treated with R. CHOP chemotherapy with complete remission. He remains in remission at this time. He received 6 cycles of chemotherapy. Problem #2 thyroid follicular carcinoma which is a variant of papillary thyroid carcinoma status post total thyroidectomy by Dr. Darnell Level Problem #3 history of colon cancer in 1993 status post resection Problem #4 hypothyroidism on Synthroid replacement Problem #5 severe degenerative joint disease of the knees status post left knee replacement 2004 and worsening joint pain on the right with less and less ability to walk. Problem #6 detached retinas in the past as well as bilateral cataract operations with lens implants  The patient is here today with his daughter with whom he lives. She has now retired to take care of him. He is getting to the point that he cannot walk into the kitchen to have dinner. He is basically in the bed or in a chair. He has to push himself up to get to a standing position. He has severe pain in the right knee. The left knee is asymptomatic. He otherwise has no B. symptomatology. His oncology review of systems remains negative.  Vital signs are stable though his weight is up 3 more pounds. He has no lymphadenopathy. He is alert and oriented. Lungs are clear. Heart shows a regular rhythm and rate without murmur rub or gallop. Abdomen is soft and nontender without hepatosplenomegaly. He has no ankle edema. He is more bow-legged from the right leg than I remember. He states that Dr. Romeo Apple has consented to operate on him. I think that is fine from my standpoint since Mr. Elkhatib should not be wheelchair-bound or bedbound in my opinion. He might benefit from cardiac clearance before surgery. We will obtain a cardiology consult since he did receive 6 cycles of  Adriamycin-based chemotherapy. He does not have any obvious cardiac symptomatology however. We will see him in 6 months. I have placed a call to Dr. Romeo Apple who is out of the office presently.

## 2012-12-14 NOTE — Progress Notes (Signed)
NOT A PATIENT OF MINE

## 2012-12-25 ENCOUNTER — Encounter (HOSPITAL_BASED_OUTPATIENT_CLINIC_OR_DEPARTMENT_OTHER): Payer: Medicare Other

## 2012-12-25 DIAGNOSIS — Z452 Encounter for adjustment and management of vascular access device: Secondary | ICD-10-CM

## 2012-12-25 DIAGNOSIS — C8589 Other specified types of non-Hodgkin lymphoma, extranodal and solid organ sites: Secondary | ICD-10-CM

## 2012-12-25 DIAGNOSIS — Z95828 Presence of other vascular implants and grafts: Secondary | ICD-10-CM

## 2012-12-25 MED ORDER — HEPARIN SOD (PORK) LOCK FLUSH 100 UNIT/ML IV SOLN
INTRAVENOUS | Status: AC
  Filled 2012-12-25: qty 5

## 2012-12-25 MED ORDER — SODIUM CHLORIDE 0.9 % IJ SOLN
10.0000 mL | INTRAMUSCULAR | Status: DC | PRN
  Administered 2012-12-25: 10 mL via INTRAVENOUS
  Filled 2012-12-25: qty 10

## 2012-12-25 MED ORDER — HEPARIN SOD (PORK) LOCK FLUSH 100 UNIT/ML IV SOLN
500.0000 [IU] | Freq: Once | INTRAVENOUS | Status: AC
  Administered 2012-12-25: 500 [IU] via INTRAVENOUS
  Filled 2012-12-25: qty 5

## 2012-12-25 NOTE — Progress Notes (Signed)
Duane Castaneda presented for Portacath access and flush. Proper placement of portacath confirmed by CXR. Portacath located lt chest wall accessed with  H 20 needle. Good blood return present. Portacath flushed with 20ml NS and 500U/26ml Heparin and needle removed intact. Procedure without incident. Patient tolerated procedure well.

## 2012-12-30 ENCOUNTER — Encounter: Payer: Self-pay | Admitting: Cardiology

## 2012-12-30 ENCOUNTER — Ambulatory Visit (INDEPENDENT_AMBULATORY_CARE_PROVIDER_SITE_OTHER): Payer: Medicare Other | Admitting: Cardiology

## 2012-12-30 VITALS — BP 160/80 | HR 75 | Ht 70.0 in | Wt 203.8 lb

## 2012-12-30 DIAGNOSIS — R9431 Abnormal electrocardiogram [ECG] [EKG]: Secondary | ICD-10-CM

## 2012-12-30 DIAGNOSIS — I10 Essential (primary) hypertension: Secondary | ICD-10-CM | POA: Diagnosis not present

## 2012-12-30 DIAGNOSIS — Z01818 Encounter for other preprocedural examination: Secondary | ICD-10-CM | POA: Diagnosis not present

## 2012-12-30 DIAGNOSIS — Z9221 Personal history of antineoplastic chemotherapy: Secondary | ICD-10-CM

## 2012-12-30 NOTE — Assessment & Plan Note (Signed)
Blood pressure elevated today. Patient reports compliance with his medications, has help at home from his daughter. No changes were made today. Doses could always be titrated over time as necessary by primary care physician.

## 2012-12-30 NOTE — Patient Instructions (Addendum)
Your physician recommends that you schedule a follow-up appointment in: Follow up to be determined  Your physician has requested that you have an echocardiogram. Echocardiography is a painless test that uses sound waves to create images of your heart. It provides your doctor with information about the size and shape of your heart and how well your heart's chambers and valves are working. This procedure takes approximately one hour. There are no restrictions for this procedure.

## 2012-12-30 NOTE — Assessment & Plan Note (Signed)
Patient being considered for elective right knee replacement due to disabling arthritic pain. He has undergone a number of surgeries over the years, including approximately 2 years ago, tolerating them without any obvious cardiac complications. He has no reported history of cardiac disease, but did have potentially cardiotoxic chemotherapy several years ago, LVEF was normal in 2005. Although disabled in terms of ambulation, he describes other activities around 4 METS without any progressive shortness of breath or angina. His baseline ECG is abnormal, possibly reflecting underlying conduction system disease, although cannot clearly rule out underlying CAD as well. We have discussed these issues, and our plan will be to obtain an echocardiogram to assess cardiac structure and function. Unless he has significant LV dysfunction with wall motion abnormalities that would require further ischemic evaluation first, I suspect he should be otherwise able to proceed with planned surgery at an overall moderate perioperative cardiac risk. His medical regimen looks good, includes a beta blocker in the form of Ziac. We will inform the patient and his daughter of the study results, and forward them to Dr. Mariel Sleet and Dr. Regino Schultze.Marland Kitchen

## 2012-12-30 NOTE — Progress Notes (Signed)
Clinical Summary [name] is a medically complex, debilitated 79 y.o.male referred by Dr. Mariel Sleet for preoperative consultation. Primary care physician is Dr. Regino Schultze. Patient being considered for possible right knee replacement by Dr. Romeo Apple due to disabling arthritis pain.  He has no reported history of cardiac disease. Does have prior history of cardiotoxic chemotherapy. Echocardiogram in 2005 demonstrated moderate septal hypertrophy with normal LV function, mildly dilated left atrium, mild mitral regurgitation, mild aortic stenosis with trivial regurgitation, RVSP 34 mm mercury.  Lab work from December reviewed finding potassium 4.0, BUN 23, creatinine 1.5, AST 19, ALT 16, hemoglobin 12.5, platelets 160. ECG today shows sinus rhythm with RBBB and LAFB, cannot rule out anteroseptal infarct pattern.  He lives with his daughter who is primary caregiver. He is very limited in terms of ambulation, able to only go from room to room due to severe right knee pain. Despite this he is able to shower in the morning, make up his bed, and then use a recumbent bicycle for at least 10 minutes, reports no angina or unusual shortness of breath. He reports compliance with his medications, outlined below. No palpitations or syncope.   No Known Allergies  Current Outpatient Prescriptions  Medication Sig Dispense Refill  . allopurinol (ZYLOPRIM) 300 MG tablet Take 300 mg by mouth daily.        Marland Kitchen aspirin 81 MG tablet Take 81 mg by mouth daily.        . bisoprolol-hydrochlorothiazide (ZIAC) 2.5-6.25 MG per tablet Take 1 tablet by mouth daily.        Marland Kitchen levothyroxine (SYNTHROID, LEVOTHROID) 150 MCG tablet Take 150 mcg by mouth daily.        Marland Kitchen lisinopril (PRINIVIL,ZESTRIL) 10 MG tablet Take 10 mg by mouth daily.        . Misc Natural Products (OSTEO BI-FLEX ADV DOUBLE ST PO) Take 1 capsule by mouth daily.       . Multiple Vitamin (MULTIVITAMIN) capsule Take 1 capsule by mouth daily.        . naproxen  sodium (ANAPROX) 220 MG tablet Take 220 mg by mouth 2 (two) times daily with a meal.        . omeprazole (PRILOSEC) 20 MG capsule Take 20 mg by mouth daily.        Marland Kitchen terazosin (HYTRIN) 10 MG capsule Take 10 mg by mouth at bedtime.          Past Medical History  Diagnosis Date  . Lymphoma     Diffuse large B cell status post R CHOP - Dr. Mariel Sleet  . Follicular thyroid carcinoma     Dr. Gerrit Friends  . History of colon cancer     Status post resection in 1993  . Essential hypertension, benign   . Degenerative joint disease   . Port catheter in place 08/21/2012  . Hypothyroidism   . Cataracts, bilateral   . Detached retina   . GERD (gastroesophageal reflux disease)     Past Surgical History  Procedure Date  . Detached retina left eye   . Bilateral cataract surgery   . Colon cancer resection   . Hernia repair   . Lymphoma surgery   . Thyroid cancer surgery   . Transurethral resection of prostate   . Left knee replacement     Dr. Arletha Grippe    Family History  Problem Relation Age of Onset  . Arthritis    . Cancer    . Lung disease      Social  History Mr. Zeimet reports that he has never smoked. He has never used smokeless tobacco. Mr. Adames reports that he does not drink alcohol.  Review of Systems Stable appetite, no orthopnea or PND. Occasional ankle edema. Severe arthritic pain involving his right knee. No falls. Otherwise negative except as outlined.  Physical Examination Filed Vitals:   12/30/12 0915  BP: 160/80  Pulse: 75   Filed Weights   12/30/12 0915  Weight: 203 lb 12 oz (92.42 kg)   No acute distress. HEENT: Conjunctiva and lids normal, oropharynx clear. Neck: Supple, no elevated JVP or carotid bruits. Lungs: Clear to auscultation, nonlabored breathing at rest. Cardiac: Regular rate and rhythm, no S3, 1-2/6 systolic murmur, no pericardial rub. Abdomen: Soft, nontender, bowel sounds present, no guarding or rebound. Extremities: No pitting edema,  distal pulses 1-2+. Skin: Warm and dry. Musculoskeletal: No kyphosis. Neuropsychiatric: Alert and oriented x3, affect grossly appropriate.   Problem List and Plan   Preoperative evaluation to rule out surgical contraindication Patient being considered for elective right knee replacement due to disabling arthritic pain. He has undergone a number of surgeries over the years, including approximately 2 years ago, tolerating them without any obvious cardiac complications. He has no reported history of cardiac disease, but did have potentially cardiotoxic chemotherapy several years ago, LVEF was normal in 2005. Although disabled in terms of ambulation, he describes other activities around 4 METS without any progressive shortness of breath or angina. His baseline ECG is abnormal, possibly reflecting underlying conduction system disease, although cannot clearly rule out underlying CAD as well. We have discussed these issues, and our plan will be to obtain an echocardiogram to assess cardiac structure and function. Unless he has significant LV dysfunction with wall motion abnormalities that would require further ischemic evaluation first, I suspect he should be otherwise able to proceed with planned surgery at an overall moderate perioperative cardiac risk. His medical regimen looks good, includes a beta blocker in the form of Ziac. We will inform the patient and his daughter of the study results, and forward them to Dr. Mariel Sleet and Dr. Regino Schultze..  Essential hypertension, benign Blood pressure elevated today. Patient reports compliance with his medications, has help at home from his daughter. No changes were made today. Doses could always be titrated over time as necessary by primary care physician.    Jonelle Sidle, M.D., F.A.C.C.

## 2012-12-31 ENCOUNTER — Ambulatory Visit (HOSPITAL_COMMUNITY)
Admission: RE | Admit: 2012-12-31 | Discharge: 2012-12-31 | Disposition: A | Discharge status: Home / Self Care | Payer: Medicare Other | Source: Ambulatory Visit | Attending: Cardiology | Admitting: Cardiology

## 2012-12-31 DIAGNOSIS — I10 Essential (primary) hypertension: Secondary | ICD-10-CM | POA: Diagnosis not present

## 2012-12-31 DIAGNOSIS — Z9221 Personal history of antineoplastic chemotherapy: Secondary | ICD-10-CM | POA: Insufficient documentation

## 2012-12-31 DIAGNOSIS — R9431 Abnormal electrocardiogram [ECG] [EKG]: Secondary | ICD-10-CM | POA: Insufficient documentation

## 2012-12-31 DIAGNOSIS — Z01818 Encounter for other preprocedural examination: Secondary | ICD-10-CM

## 2012-12-31 DIAGNOSIS — I517 Cardiomegaly: Secondary | ICD-10-CM | POA: Diagnosis not present

## 2012-12-31 DIAGNOSIS — Z01810 Encounter for preprocedural cardiovascular examination: Secondary | ICD-10-CM | POA: Diagnosis not present

## 2012-12-31 NOTE — Progress Notes (Signed)
*  PRELIMINARY RESULTS* Echocardiogram 2D Echocardiogram has been performed.  Conrad [place] 12/31/2012, 11:28 AM

## 2013-01-02 DIAGNOSIS — M159 Polyosteoarthritis, unspecified: Secondary | ICD-10-CM | POA: Diagnosis not present

## 2013-01-02 DIAGNOSIS — Z6829 Body mass index (BMI) 29.0-29.9, adult: Secondary | ICD-10-CM | POA: Diagnosis not present

## 2013-01-13 ENCOUNTER — Ambulatory Visit (INDEPENDENT_AMBULATORY_CARE_PROVIDER_SITE_OTHER): Payer: Medicare Other

## 2013-01-13 ENCOUNTER — Ambulatory Visit (INDEPENDENT_AMBULATORY_CARE_PROVIDER_SITE_OTHER): Payer: Medicare Other | Admitting: Orthopedic Surgery

## 2013-01-13 ENCOUNTER — Encounter: Payer: Self-pay | Admitting: Orthopedic Surgery

## 2013-01-13 VITALS — BP 142/70 | Ht 69.0 in | Wt 200.0 lb

## 2013-01-13 DIAGNOSIS — M25569 Pain in unspecified knee: Secondary | ICD-10-CM | POA: Diagnosis not present

## 2013-01-13 NOTE — H&P (Signed)
TOTAL KNEE ADMISSION H&P  Patient is being admitted for right total knee arthroplasty.  Subjective:  Chief Complaint:right knee pain.  HPI: Duane Castaneda, 79 y.o. male, has a history of pain and functional disability in the right knee due to arthritis and has failed non-surgical conservative treatments for greater than 12 weeks to includeNSAID's and/or analgesics, corticosteriod injections, use of assistive devices and activity modification.  Onset of symptoms was gradual, starting >10 years ago with gradually worsening course since that time. The patient noted no past surgery on the right knee(s).  Patient currently rates pain in the right knee(s) at 7 out of 10 with activity. Patient has night pain, worsening of pain with activity and weight bearing, pain that interferes with activities of daily living, pain with passive range of motion, crepitus and joint swelling.  Patient has evidence of subchondral cysts, subchondral sclerosis, periarticular osteophytes, joint subluxation and joint space narrowing by imaging studies. There is no active infection.  This patient has had cardiac workup echocardiogra  oncology consult and medical consult and has been cleared for surgery .  He and his daughter understand the risk of this procedure and the possible complications and they're willing to take those risks. Risks that were discussed include but are not limited to bleeding infection heart attack heart disease and death.  Review of systems currently normal   Patient Active Problem List   Diagnosis Date Noted  . Preoperative evaluation to rule out surgical contraindication 12/30/2012  . Essential hypertension, benign 12/30/2012  . Port catheter in place 08/21/2012  . OA (osteoarthritis) of knee 09/03/2011   Past Medical History  Diagnosis Date  . Lymphoma     Diffuse large B cell status post R CHOP - Dr. Mariel Sleet  . Follicular thyroid carcinoma     Dr. Gerrit Friends  . History of colon cancer      Status post resection in 1993  . Essential hypertension, benign   . Degenerative joint disease   . Port catheter in place 08/21/2012  . Hypothyroidism   . Cataracts, bilateral   . Detached retina   . GERD (gastroesophageal reflux disease)     Past Surgical History  Procedure Date  . Detached retina left eye   . Bilateral cataract surgery   . Colon cancer resection   . Hernia repair   . Lymphoma surgery   . Thyroid cancer surgery   . Transurethral resection of prostate   . Left knee replacement     Dr. Arletha Grippe     (Not in a hospital admission) No Known Allergies  History  Substance Use Topics  . Smoking status: Never Smoker   . Smokeless tobacco: Never Used  . Alcohol Use: No    Family History  Problem Relation Age of Onset  . Arthritis    . Cancer    . Lung disease       ROS  Objective:  Physical Exam  Vital signs in last 24 hours: BP 142/70  Ht 5\' 9"  (1.753 m)  Wt 200 lb (90.719 kg)  BMI 29.53 kg/m2 Overall the patient looks to be in good condition. He has a reasonable weight is oriented x3 his mood and affect are normal he is ambulating with his assisted devices as severe varus thrust and he uses a cane  The right knee is in severe varus although he is maintained 120 of knee flexion he has a less than 5 flexion contracture excellent extension power in his knee remained stable. His skin  shows discoloration from venous stasis disease which is mild and not active at this time. Has good distal pulse no peripheral edema or swelling no lymphadenopathy normal sensation good reflexes and he has good balance  His left knee had knee replacement it has 110 of knee flexion less than 5 flexion contracture well-healed incision good strength normal stability and no malalignment  Upper extremity exam  The right and left upper extremity:   Inspection and palpation revealed no abnormalities in the upper extremities.   Range of motion is full without  contracture.  Motor exam is normal with grade 5 strength.  The joints are fully reduced without subluxation.  There is no atrophy or tremor and muscle tone is normal.  All joints are stable.     Labs:   Estimated Body mass index is 29.53 kg/(m^2) as calculated from the following:   Height as of this encounter: 5\' 9" (1.753 m).   Weight as of this encounter: 200 lb(90.719 kg).   Imaging Review Plain radiographs demonstrate severe degenerative joint disease of the right knee(s). The overall alignment issignificant varus. The bone quality appears to be good for age and reported activity level.  Assessment/Plan:  End stage arthritis, right knee   The patient history, physical examination, clinical judgment of the provider and imaging studies are consistent with end stage degenerative joint disease of the right knee(s) and total knee arthroplasty is deemed medically necessary. The treatment options including medical management, injection therapy arthroscopy and arthroplasty were discussed at length. The risks and benefits of total knee arthroplasty were presented and reviewed. The risks due to aseptic loosening, infection, stiffness, patella tracking problems, thromboembolic complications and other imponderables were discussed. The patient acknowledged the explanation, agreed to proceed with the plan and consent was signed. Patient is being admitted for inpatient treatment for surgery, pain control, PT, OT, prophylactic antibiotics, VTE prophylaxis, progressive ambulation and ADL's and discharge planning. The patient is planning to be discharged to skilled nursing facility

## 2013-01-13 NOTE — Patient Instructions (Addendum)
Right tka   You have been scheduled for surgery.  All surgeries carry some risk.  Remember you always have the option of continued nonsurgical treatment. However in this situation the risks vs. the benefits favor surgery as the best treatment option. The risks of the surgery includes the following but is not limited to bleeding, infection, pulmonary embolus, death from anesthesia, nerve injury vascular injury or need for further surgery, continued pain.  Specific to this procedure the following risks and complications are rare but possible Stiffness Pain  Infection which may require several subsequent surgeries including an amputation of the infection cannot be removed Instability   Stop aspirin and aleve on Feb 25

## 2013-01-13 NOTE — Progress Notes (Signed)
Patient ID: Duane Castaneda, male   DOB: Mar 18, 1923, 79 y.o.   MRN: [id number] Chief Complaint  Patient presents with  . Follow-up    right knee referred by Dr. Sherwood Gambler    Preoperative history and physical for right total knee  See appropriate portion of the chart for full history and physical which is incorporated by reference today's x-ray report is included  X-rays show severe varus osteoarthritis.  Recommend right total knee arthroplasty

## 2013-01-21 ENCOUNTER — Encounter (HOSPITAL_COMMUNITY): Payer: Self-pay | Admitting: Pharmacy Technician

## 2013-02-01 ENCOUNTER — Telehealth: Payer: Self-pay | Admitting: Orthopedic Surgery

## 2013-02-01 NOTE — Patient Instructions (Addendum)
Your procedure is scheduled on: 02/08/2013  Report to [facility] at  6:15   AM.  Call this number if you have problems the morning of surgery: [phone]   Remember:   Do not drink or eat food:After Midnight.  :  Take these medicines the morning of surgery with A SIP OF WATER: Bisoprolol-HCTZ, Omeprazole, Lisinopril, and Levothyroxine.   Do not wear jewelry, make-up or nail polish.  Do not wear lotions, powders, or perfumes.   Do not shave 48 hours prior to surgery. Men may shave face and neck.  Do not bring valuables to the hospital.  Contacts, dentures or bridgework may not be worn into surgery.  Leave suitcase in the car. After surgery it may be brought to your room.  For patients admitted to the hospital, checkout time is 11:00 AM the day of discharge.   Patients discharged the day of surgery will not be allowed to drive home.    Special Instructions: Shower using CHG 2 nights before surgery and the night before surgery.  If you shower the day of surgery use CHG.  Use special wash - you have one bottle of CHG for all showers.  You should use approximately 1/3 of the bottle for each shower.   Please read over the following fact sheets that you were given: Pain Booklet, MRSA Information, Surgical Site Infection Prevention and Care and Recovery After Surgery   Total Knee Replacement Total knee replacement is a procedure to replace your knee joint with an artificial knee joint (prosthetic knee joint). The purpose of this surgery is to reduce pain and improve your knee function. LET YOUR CAREGIVER KNOW ABOUT:   Any allergies you have.  Any medicines you are taking, including vitamins, herbs, eyedrops, over-the-counter medicines, and creams.  Any problems you have had with the use of anesthetics.  Family history of problems with the use of anesthetics.  Any blood disorders you have, including bleeding problems or clotting problems.  Previous surgeries you have had. RISKS AND  COMPLICATIONS  Generally, total knee replacement is a safe procedure. However, as with any surgical procedure, complications can occur. Possible complications associated with total knee replacement include:  Loss of range of motion of the knee or instability.  Loosening of the prosthesis.  Infection.  Persistent pain. BEFORE THE PROCEDURE   Your caregiver will instruct you when you need to stop eating and drinking.  Ask your caregiver if you need to change or stop any regular medicines. PROCEDURE  Just before the procedure you will receive medicine that will make you drowsy (sedative). This will be given through a tube that is inserted into one of your veins (intravenous [IV] tube). Then you will either receive medicine to block pain from the waist down through your legs (spinal block) or medicine to also receive medicine to make you fall asleep (general anesthetic). You may also receive medicine to block feeling in your leg (nerve block) to help ease pain after surgery. An incision will be made in your knee. Your surgeon will take out any damaged cartilage and bone by sawing off the damaged surfaces. Then the surgeon will put a new metal liner over the sawed off portion of your thigh bone (femur) and a plastic liner over the sawed off portion of one of the bones of your lower leg (tibia). This is to restore alignment and function to your knee. A plastic piece is often used to restore the surface of your knee cap. AFTER THE PROCEDURE  You will be taken to the recovery area. You may have drainage tubes to drain excess fluid from your knee. These tubes attach to a device that removes these fluids. Once you are awake, stable, and taking fluids well, you will be taken to your hospital room. You will receive physical therapy as prescribed by your caregiver. The length of your stay in the hospital after a knee replacement is 2 4 days. Your surgeon may recommend that you spend time (usually an additional  10 14 days) in an extended-care facility to help you begin walking again and improve your range of motion before you go home. You may also be prescribed blood-thinning medicine to decrease your risk of developing blood clots in your leg. Document Released: 03/03/2001 Document Revised: 05/26/2012 Document Reviewed: 01/05/2012 [facility] Patient Information 2013 [place], [place].   PATIENT INSTRUCTIONS POST-ANESTHESIA  IMMEDIATELY FOLLOWING SURGERY:  Do not drive or operate machinery for the first twenty four hours after surgery.  Do not make any important decisions for twenty four hours after surgery or while taking narcotic pain medications or sedatives.  If you develop intractable nausea and vomiting or a severe headache please notify your doctor immediately.  FOLLOW-UP:  Please make an appointment with your surgeon as instructed. You do not need to follow up with anesthesia unless specifically instructed to do so.  WOUND CARE INSTRUCTIONS (if applicable):  Keep a dry clean dressing on the anesthesia/puncture wound site if there is drainage.  Once the wound has quit draining you may leave it open to air.  Generally you should leave the bandage intact for twenty four hours unless there is drainage.  If the epidural site drains for more than 36-48 hours please call the anesthesia department.  QUESTIONS?:  Please feel free to call your physician or the hospital operator if you have any questions, and they will be happy to assist you.

## 2013-02-01 NOTE — Telephone Encounter (Signed)
Per Medicare guidelines, no pre-authorization required for surgery, admit/in-patient 02/08/13, [facility], [place] [address].

## 2013-02-02 ENCOUNTER — Encounter (HOSPITAL_COMMUNITY): Payer: Self-pay

## 2013-02-02 ENCOUNTER — Encounter (HOSPITAL_COMMUNITY)
Admission: RE | Admit: 2013-02-02 | Discharge: 2013-02-02 | Disposition: A | Discharge status: Home / Self Care | Payer: Medicare Other | Source: Ambulatory Visit | Attending: Orthopedic Surgery | Admitting: Orthopedic Surgery

## 2013-02-02 DIAGNOSIS — I10 Essential (primary) hypertension: Secondary | ICD-10-CM | POA: Diagnosis not present

## 2013-02-02 DIAGNOSIS — Z85850 Personal history of malignant neoplasm of thyroid: Secondary | ICD-10-CM | POA: Diagnosis not present

## 2013-02-02 DIAGNOSIS — M1990 Unspecified osteoarthritis, unspecified site: Secondary | ICD-10-CM | POA: Diagnosis not present

## 2013-02-02 DIAGNOSIS — E039 Hypothyroidism, unspecified: Secondary | ICD-10-CM | POA: Diagnosis present

## 2013-02-02 DIAGNOSIS — Z85038 Personal history of other malignant neoplasm of large intestine: Secondary | ICD-10-CM | POA: Diagnosis not present

## 2013-02-02 DIAGNOSIS — D62 Acute posthemorrhagic anemia: Secondary | ICD-10-CM | POA: Diagnosis not present

## 2013-02-02 DIAGNOSIS — M1710 Unilateral primary osteoarthritis, unspecified knee: Secondary | ICD-10-CM | POA: Diagnosis not present

## 2013-02-02 DIAGNOSIS — Z96659 Presence of unspecified artificial knee joint: Secondary | ICD-10-CM | POA: Diagnosis not present

## 2013-02-02 DIAGNOSIS — M25569 Pain in unspecified knee: Secondary | ICD-10-CM | POA: Diagnosis not present

## 2013-02-02 DIAGNOSIS — R41841 Cognitive communication deficit: Secondary | ICD-10-CM | POA: Diagnosis not present

## 2013-02-02 DIAGNOSIS — Z87898 Personal history of other specified conditions: Secondary | ICD-10-CM | POA: Diagnosis not present

## 2013-02-02 DIAGNOSIS — M6281 Muscle weakness (generalized): Secondary | ICD-10-CM | POA: Diagnosis not present

## 2013-02-02 DIAGNOSIS — Z471 Aftercare following joint replacement surgery: Secondary | ICD-10-CM | POA: Diagnosis not present

## 2013-02-02 DIAGNOSIS — R262 Difficulty in walking, not elsewhere classified: Secondary | ICD-10-CM | POA: Diagnosis not present

## 2013-02-02 DIAGNOSIS — R279 Unspecified lack of coordination: Secondary | ICD-10-CM | POA: Diagnosis not present

## 2013-02-02 DIAGNOSIS — M810 Age-related osteoporosis without current pathological fracture: Secondary | ICD-10-CM | POA: Diagnosis not present

## 2013-02-02 DIAGNOSIS — K219 Gastro-esophageal reflux disease without esophagitis: Secondary | ICD-10-CM | POA: Diagnosis not present

## 2013-02-02 LAB — HEMOGLOBIN AND HEMATOCRIT, BLOOD
HCT: 38.4 % — ABNORMAL LOW (ref 39.0–52.0)
Hemoglobin: 12.6 g/dL — ABNORMAL LOW (ref 13.0–17.0)

## 2013-02-02 LAB — BASIC METABOLIC PANEL
CO2: 29 mEq/L (ref 19–32)
Calcium: 9.8 mg/dL (ref 8.4–10.5)
Chloride: 102 mEq/L (ref 96–112)
GFR calc Af Amer: 47 mL/min — ABNORMAL LOW (ref >90)
Sodium: 141 mEq/L (ref 135–145)

## 2013-02-02 MED ORDER — CHLORHEXIDINE GLUCONATE 4 % EX LIQD: 60.0000 mL | Freq: Once | CUTANEOUS | Status: DC

## 2013-02-05 ENCOUNTER — Encounter (HOSPITAL_COMMUNITY): Payer: Medicare Other | Attending: Oncology

## 2013-02-05 DIAGNOSIS — Z9889 Other specified postprocedural states: Secondary | ICD-10-CM | POA: Insufficient documentation

## 2013-02-05 DIAGNOSIS — Z452 Encounter for adjustment and management of vascular access device: Secondary | ICD-10-CM

## 2013-02-05 DIAGNOSIS — C8589 Other specified types of non-Hodgkin lymphoma, extranodal and solid organ sites: Secondary | ICD-10-CM | POA: Insufficient documentation

## 2013-02-05 MED ORDER — HEPARIN SOD (PORK) LOCK FLUSH 100 UNIT/ML IV SOLN
INTRAVENOUS | Status: AC
  Filled 2013-02-05: qty 5

## 2013-02-05 MED ORDER — HEPARIN SOD (PORK) LOCK FLUSH 100 UNIT/ML IV SOLN
500.0000 [IU] | Freq: Once | INTRAVENOUS | Status: AC
  Administered 2013-02-05: 500 [IU] via INTRAVENOUS
  Filled 2013-02-05: qty 5

## 2013-02-05 NOTE — Progress Notes (Signed)
Duane Castaneda presented for Portacath access and flush. Proper placement of portacath confirmed by CXR. Portacath located lt chest wall accessed with  H 20 needle. Good blood return present. Portacath flushed with 20ml NS and 500U/71ml Heparin and needle removed intact. Procedure without incident. Patient tolerated procedure well.  Pt's daughter to call for port flush appt. Patient having total knee replacement on 02/08/13.

## 2013-02-06 HISTORY — PX: TOTAL KNEE ARTHROPLASTY: SHX125

## 2013-02-08 ENCOUNTER — Encounter (HOSPITAL_COMMUNITY)
Admission: RE | Disposition: A | Discharge status: Skilled Nursing Facility | Payer: Self-pay | Source: Ambulatory Visit | Attending: Orthopedic Surgery

## 2013-02-08 ENCOUNTER — Inpatient Hospital Stay (HOSPITAL_COMMUNITY): Payer: Medicare Other

## 2013-02-08 ENCOUNTER — Encounter (HOSPITAL_COMMUNITY): Payer: Self-pay | Admitting: *Deleted

## 2013-02-08 ENCOUNTER — Inpatient Hospital Stay (HOSPITAL_COMMUNITY)
Admission: RE | Admit: 2013-02-08 | Discharge: 2013-02-11 | DRG: 470 | Disposition: A | Discharge status: Skilled Nursing Facility | Payer: Medicare Other | Source: Ambulatory Visit | Attending: Orthopedic Surgery | Admitting: Orthopedic Surgery

## 2013-02-08 ENCOUNTER — Encounter (HOSPITAL_COMMUNITY): Payer: Self-pay | Admitting: Anesthesiology

## 2013-02-08 ENCOUNTER — Inpatient Hospital Stay (HOSPITAL_COMMUNITY): Payer: Medicare Other | Admitting: Anesthesiology

## 2013-02-08 DIAGNOSIS — M1710 Unilateral primary osteoarthritis, unspecified knee: Principal | ICD-10-CM | POA: Diagnosis present

## 2013-02-08 DIAGNOSIS — Z85038 Personal history of other malignant neoplasm of large intestine: Secondary | ICD-10-CM

## 2013-02-08 DIAGNOSIS — Z85850 Personal history of malignant neoplasm of thyroid: Secondary | ICD-10-CM

## 2013-02-08 DIAGNOSIS — M1990 Unspecified osteoarthritis, unspecified site: Secondary | ICD-10-CM | POA: Diagnosis present

## 2013-02-08 DIAGNOSIS — D62 Acute posthemorrhagic anemia: Secondary | ICD-10-CM | POA: Diagnosis not present

## 2013-02-08 DIAGNOSIS — Z87898 Personal history of other specified conditions: Secondary | ICD-10-CM

## 2013-02-08 DIAGNOSIS — K219 Gastro-esophageal reflux disease without esophagitis: Secondary | ICD-10-CM | POA: Diagnosis present

## 2013-02-08 DIAGNOSIS — I10 Essential (primary) hypertension: Secondary | ICD-10-CM | POA: Diagnosis present

## 2013-02-08 DIAGNOSIS — E039 Hypothyroidism, unspecified: Secondary | ICD-10-CM | POA: Diagnosis present

## 2013-02-08 HISTORY — PX: TOTAL KNEE ARTHROPLASTY: SHX125

## 2013-02-08 SURGERY — ARTHROPLASTY, KNEE, TOTAL
Anesthesia: Spinal | Site: Knee | Laterality: Right | Wound class: Clean

## 2013-02-08 MED ORDER — PREGABALIN 50 MG PO CAPS
50.0000 mg | ORAL_CAPSULE | Freq: Three times a day (TID) | ORAL | Status: DC
  Administered 2013-02-08 – 2013-02-09 (×6): 50 mg via ORAL
  Filled 2013-02-08 (×6): qty 1

## 2013-02-08 MED ORDER — ALUM & MAG HYDROXIDE-SIMETH 200-200-20 MG/5ML PO SUSP: 30.0000 mL | ORAL | Status: DC | PRN

## 2013-02-08 MED ORDER — LACTATED RINGERS IV SOLN
INTRAVENOUS | Status: DC
  Administered 2013-02-08: 1000 mL via INTRAVENOUS

## 2013-02-08 MED ORDER — CELECOXIB 100 MG PO CAPS
200.0000 mg | ORAL_CAPSULE | Freq: Every day | ORAL | Status: DC
  Administered 2013-02-09 – 2013-02-11 (×3): 200 mg via ORAL
  Filled 2013-02-08 (×3): qty 2

## 2013-02-08 MED ORDER — BUPIVACAINE IN DEXTROSE 0.75-8.25 % IT SOLN
INTRATHECAL | Status: DC | PRN
  Administered 2013-02-08: 15 mg via INTRATHECAL

## 2013-02-08 MED ORDER — PROPOFOL INFUSION 10 MG/ML OPTIME
INTRAVENOUS | Status: DC | PRN
  Administered 2013-02-08: 25 ug/kg/min via INTRAVENOUS
  Administered 2013-02-08: 50 ug/kg/min via INTRAVENOUS

## 2013-02-08 MED ORDER — TRAMADOL HCL 50 MG PO TABS
50.0000 mg | ORAL_TABLET | Freq: Four times a day (QID) | ORAL | Status: DC
  Administered 2013-02-08 – 2013-02-10 (×8): 50 mg via ORAL
  Filled 2013-02-08 (×8): qty 1

## 2013-02-08 MED ORDER — MIDAZOLAM HCL 2 MG/2ML IJ SOLN
1.0000 mg | INTRAMUSCULAR | Status: DC | PRN
  Administered 2013-02-08: 2 mg via INTRAVENOUS

## 2013-02-08 MED ORDER — SENNOSIDES-DOCUSATE SODIUM 8.6-50 MG PO TABS: 1.0000 | ORAL_TABLET | Freq: Every evening | ORAL | Status: DC | PRN

## 2013-02-08 MED ORDER — EPHEDRINE SULFATE 50 MG/ML IJ SOLN
INTRAMUSCULAR | Status: DC | PRN
  Administered 2013-02-08: 10 mg via INTRAVENOUS

## 2013-02-08 MED ORDER — DOCUSATE SODIUM 100 MG PO CAPS
100.0000 mg | ORAL_CAPSULE | Freq: Two times a day (BID) | ORAL | Status: DC
  Administered 2013-02-08 – 2013-02-11 (×7): 100 mg via ORAL
  Filled 2013-02-08 (×7): qty 1

## 2013-02-08 MED ORDER — LISINOPRIL 10 MG PO TABS
10.0000 mg | ORAL_TABLET | Freq: Every day | ORAL | Status: DC
  Administered 2013-02-10 – 2013-02-11 (×2): 10 mg via ORAL
  Filled 2013-02-08 (×2): qty 1

## 2013-02-08 MED ORDER — FENTANYL CITRATE 0.05 MG/ML IJ SOLN
INTRAMUSCULAR | Status: AC
  Filled 2013-02-08: qty 2

## 2013-02-08 MED ORDER — METHOCARBAMOL 100 MG/ML IJ SOLN
500.0000 mg | Freq: Four times a day (QID) | INTRAVENOUS | Status: DC
  Filled 2013-02-08 (×5): qty 5

## 2013-02-08 MED ORDER — ONDANSETRON HCL 4 MG/2ML IJ SOLN: 4.0000 mg | Freq: Once | INTRAMUSCULAR | Status: DC | PRN

## 2013-02-08 MED ORDER — PREGABALIN 50 MG PO CAPS
50.0000 mg | ORAL_CAPSULE | Freq: Once | ORAL | Status: AC
  Administered 2013-02-08: 50 mg via ORAL

## 2013-02-08 MED ORDER — ADULT MULTIVITAMIN W/MINERALS CH
1.0000 | ORAL_TABLET | Freq: Every day | ORAL | Status: DC
  Administered 2013-02-08 – 2013-02-11 (×4): 1 via ORAL
  Filled 2013-02-08 (×4): qty 1

## 2013-02-08 MED ORDER — LACTATED RINGERS IV SOLN
INTRAVENOUS | Status: DC | PRN
  Administered 2013-02-08 (×2): via INTRAVENOUS

## 2013-02-08 MED ORDER — ONDANSETRON HCL 4 MG/2ML IJ SOLN
INTRAMUSCULAR | Status: AC
  Filled 2013-02-08: qty 2

## 2013-02-08 MED ORDER — METOCLOPRAMIDE HCL 10 MG PO TABS: 5.0000 mg | ORAL_TABLET | Freq: Three times a day (TID) | ORAL | Status: DC | PRN

## 2013-02-08 MED ORDER — ALLOPURINOL 300 MG PO TABS
300.0000 mg | ORAL_TABLET | Freq: Every day | ORAL | Status: DC
Start: 2013-02-09 — End: 2013-02-11
  Administered 2013-02-09 – 2013-02-11 (×3): 300 mg via ORAL
  Filled 2013-02-08 (×3): qty 1

## 2013-02-08 MED ORDER — BUPIVACAINE-EPINEPHRINE PF 0.5-1:200000 % IJ SOLN
INTRAMUSCULAR | Status: AC
  Filled 2013-02-08: qty 30

## 2013-02-08 MED ORDER — BISOPROLOL-HYDROCHLOROTHIAZIDE 2.5-6.25 MG PO TABS
1.0000 | ORAL_TABLET | Freq: Every day | ORAL | Status: DC
  Administered 2013-02-10 – 2013-02-11 (×2): 1 via ORAL
  Filled 2013-02-08 (×4): qty 1

## 2013-02-08 MED ORDER — MAGNESIUM CITRATE PO SOLN: 1.0000 | Freq: Once | ORAL | Status: AC | PRN

## 2013-02-08 MED ORDER — ONDANSETRON HCL 4 MG/2ML IJ SOLN: 4.0000 mg | Freq: Four times a day (QID) | INTRAMUSCULAR | Status: DC | PRN

## 2013-02-08 MED ORDER — BISACODYL 10 MG RE SUPP: 10.0000 mg | Freq: Every day | RECTAL | Status: DC | PRN

## 2013-02-08 MED ORDER — CELECOXIB 100 MG PO CAPS
ORAL_CAPSULE | ORAL | Status: AC
  Filled 2013-02-08: qty 4

## 2013-02-08 MED ORDER — LEVOTHYROXINE SODIUM 75 MCG PO TABS
150.0000 ug | ORAL_TABLET | Freq: Every day | ORAL | Status: DC
Start: 2013-02-09 — End: 2013-02-11
  Administered 2013-02-09 – 2013-02-11 (×3): 150 ug via ORAL
  Filled 2013-02-08 (×3): qty 2

## 2013-02-08 MED ORDER — PROPOFOL 10 MG/ML IV EMUL
INTRAVENOUS | Status: AC
  Filled 2013-02-08: qty 20

## 2013-02-08 MED ORDER — SENNA 8.6 MG PO TABS
1.0000 | ORAL_TABLET | Freq: Two times a day (BID) | ORAL | Status: DC
  Administered 2013-02-08 – 2013-02-11 (×7): 8.6 mg via ORAL
  Filled 2013-02-08 (×7): qty 1

## 2013-02-08 MED ORDER — ASPIRIN EC 325 MG PO TBEC
325.0000 mg | DELAYED_RELEASE_TABLET | Freq: Every day | ORAL | Status: DC
  Administered 2013-02-09 – 2013-02-11 (×3): 325 mg via ORAL
  Filled 2013-02-08 (×3): qty 1

## 2013-02-08 MED ORDER — TRAMADOL HCL 50 MG PO TABS
ORAL_TABLET | ORAL | Status: AC
  Filled 2013-02-08: qty 1

## 2013-02-08 MED ORDER — GLYCOPYRROLATE 0.2 MG/ML IJ SOLN
INTRAMUSCULAR | Status: DC | PRN
Start: 2013-02-08 — End: 2013-02-08
  Administered 2013-02-08: 0.2 mg via INTRAVENOUS

## 2013-02-08 MED ORDER — ACETAMINOPHEN 10 MG/ML IV SOLN
1000.0000 mg | Freq: Once | INTRAVENOUS | Status: AC
  Administered 2013-02-08: 1000 mg via INTRAVENOUS

## 2013-02-08 MED ORDER — PANTOPRAZOLE SODIUM 40 MG PO TBEC
40.0000 mg | DELAYED_RELEASE_TABLET | Freq: Every day | ORAL | Status: DC
  Administered 2013-02-09 – 2013-02-11 (×3): 40 mg via ORAL
  Filled 2013-02-08 (×4): qty 1

## 2013-02-08 MED ORDER — FENTANYL CITRATE 0.05 MG/ML IJ SOLN: 25.0000 ug | INTRAMUSCULAR | Status: DC | PRN

## 2013-02-08 MED ORDER — CELECOXIB 100 MG PO CAPS
400.0000 mg | ORAL_CAPSULE | Freq: Once | ORAL | Status: AC
  Administered 2013-02-08: 400 mg via ORAL

## 2013-02-08 MED ORDER — PREGABALIN 50 MG PO CAPS
ORAL_CAPSULE | ORAL | Status: AC
  Filled 2013-02-08: qty 1

## 2013-02-08 MED ORDER — DIPHENHYDRAMINE HCL 12.5 MG/5ML PO ELIX: 12.5000 mg | ORAL_SOLUTION | ORAL | Status: DC | PRN

## 2013-02-08 MED ORDER — SODIUM CHLORIDE 0.9 % IR SOLN
Status: DC | PRN
  Administered 2013-02-08: 1000 mL

## 2013-02-08 MED ORDER — ACETAMINOPHEN 10 MG/ML IV SOLN
1000.0000 mg | Freq: Four times a day (QID) | INTRAVENOUS | Status: AC
  Administered 2013-02-08 (×2): 1000 mg via INTRAVENOUS
  Filled 2013-02-08 (×3): qty 100

## 2013-02-08 MED ORDER — METOCLOPRAMIDE HCL 5 MG/ML IJ SOLN: 5.0000 mg | Freq: Three times a day (TID) | INTRAMUSCULAR | Status: DC | PRN

## 2013-02-08 MED ORDER — CELECOXIB 100 MG PO CAPS: 200.0000 mg | ORAL_CAPSULE | Freq: Every day | ORAL | Status: DC

## 2013-02-08 MED ORDER — FENTANYL CITRATE 0.05 MG/ML IJ SOLN
INTRAMUSCULAR | Status: DC | PRN
  Administered 2013-02-08: 25 ug via INTRAVENOUS
  Administered 2013-02-08: 30 ug via INTRAVENOUS
  Administered 2013-02-08: 25 ug via INTRAVENOUS
  Administered 2013-02-08: 20 ug via INTRATHECAL

## 2013-02-08 MED ORDER — ONDANSETRON HCL 4 MG/2ML IJ SOLN
4.0000 mg | Freq: Once | INTRAMUSCULAR | Status: AC
  Administered 2013-02-08: 4 mg via INTRAVENOUS

## 2013-02-08 MED ORDER — GLYCOPYRROLATE 0.2 MG/ML IJ SOLN
INTRAMUSCULAR | Status: AC
  Filled 2013-02-08: qty 1

## 2013-02-08 MED ORDER — CEFAZOLIN SODIUM-DEXTROSE 2-3 GM-% IV SOLR
INTRAVENOUS | Status: DC | PRN
  Administered 2013-02-08: 2 g via INTRAVENOUS

## 2013-02-08 MED ORDER — METHOCARBAMOL 500 MG PO TABS
500.0000 mg | ORAL_TABLET | Freq: Four times a day (QID) | ORAL | Status: DC
  Administered 2013-02-08 – 2013-02-10 (×7): 500 mg via ORAL
  Filled 2013-02-08 (×7): qty 1

## 2013-02-08 MED ORDER — MULTIVITAMINS PO CAPS
1.0000 | ORAL_CAPSULE | Freq: Every day | ORAL | Status: DC
Start: 2013-02-08 — End: 2013-02-08

## 2013-02-08 MED ORDER — CELECOXIB 100 MG PO CAPS
200.0000 mg | ORAL_CAPSULE | Freq: Two times a day (BID) | ORAL | Status: DC
  Administered 2013-02-08: 200 mg via ORAL
  Filled 2013-02-08: qty 2

## 2013-02-08 MED ORDER — CEFAZOLIN SODIUM-DEXTROSE 2-3 GM-% IV SOLR
INTRAVENOUS | Status: AC
  Filled 2013-02-08: qty 50

## 2013-02-08 MED ORDER — SODIUM CHLORIDE 0.9 % IV SOLN
INTRAVENOUS | Status: DC
  Administered 2013-02-08 – 2013-02-09 (×2): via INTRAVENOUS

## 2013-02-08 MED ORDER — HYDROMORPHONE HCL PF 1 MG/ML IJ SOLN
0.5000 mg | INTRAMUSCULAR | Status: DC | PRN
  Administered 2013-02-08 – 2013-02-09 (×2): 0.5 mg via INTRAVENOUS
  Filled 2013-02-08 (×2): qty 1

## 2013-02-08 MED ORDER — BUPIVACAINE-EPINEPHRINE (PF) 0.5% -1:200000 IJ SOLN
INTRAMUSCULAR | Status: DC | PRN
  Administered 2013-02-08: 60 mL

## 2013-02-08 MED ORDER — LIDOCAINE HCL (PF) 1 % IJ SOLN
INTRAMUSCULAR | Status: AC
  Filled 2013-02-08: qty 5

## 2013-02-08 MED ORDER — ACETAMINOPHEN 325 MG PO TABS
650.0000 mg | ORAL_TABLET | Freq: Four times a day (QID) | ORAL | Status: DC | PRN
  Administered 2013-02-08: 650 mg via ORAL
  Filled 2013-02-08: qty 2

## 2013-02-08 MED ORDER — ACETAMINOPHEN 650 MG RE SUPP: 650.0000 mg | Freq: Four times a day (QID) | RECTAL | Status: DC | PRN

## 2013-02-08 MED ORDER — CEFAZOLIN SODIUM-DEXTROSE 2-3 GM-% IV SOLR: 2.0000 g | INTRAVENOUS | Status: DC

## 2013-02-08 MED ORDER — MENTHOL 3 MG MT LOZG: 1.0000 | LOZENGE | OROMUCOSAL | Status: DC | PRN

## 2013-02-08 MED ORDER — MIDAZOLAM HCL 2 MG/2ML IJ SOLN
INTRAMUSCULAR | Status: AC
  Filled 2013-02-08: qty 2

## 2013-02-08 MED ORDER — CEFAZOLIN SODIUM-DEXTROSE 2-3 GM-% IV SOLR
2.0000 g | Freq: Four times a day (QID) | INTRAVENOUS | Status: AC
Start: 2013-02-08 — End: 2013-02-08
  Administered 2013-02-08 (×2): 2 g via INTRAVENOUS
  Filled 2013-02-08 (×3): qty 50

## 2013-02-08 MED ORDER — ACETAMINOPHEN 10 MG/ML IV SOLN
INTRAVENOUS | Status: AC
  Filled 2013-02-08: qty 100

## 2013-02-08 MED ORDER — METHOCARBAMOL 100 MG/ML IJ SOLN
500.0000 mg | Freq: Once | INTRAVENOUS | Status: AC
  Administered 2013-02-08: 500 mg via INTRAVENOUS
  Filled 2013-02-08: qty 5

## 2013-02-08 MED ORDER — ONDANSETRON HCL 4 MG PO TABS: 4.0000 mg | ORAL_TABLET | Freq: Four times a day (QID) | ORAL | Status: DC | PRN

## 2013-02-08 MED ORDER — TERAZOSIN HCL 5 MG PO CAPS
10.0000 mg | ORAL_CAPSULE | Freq: Every day | ORAL | Status: DC
  Administered 2013-02-08 – 2013-02-10 (×3): 10 mg via ORAL
  Filled 2013-02-08 (×3): qty 2

## 2013-02-08 MED ORDER — PHENOL 1.4 % MT LIQD: 1.0000 | OROMUCOSAL | Status: DC | PRN

## 2013-02-08 MED ORDER — EPHEDRINE SULFATE 50 MG/ML IJ SOLN
INTRAMUSCULAR | Status: AC
  Filled 2013-02-08: qty 1

## 2013-02-08 MED ORDER — LIDOCAINE HCL (CARDIAC) 10 MG/ML IV SOLN
INTRAVENOUS | Status: DC | PRN
  Administered 2013-02-08: 50 mg via INTRAVENOUS

## 2013-02-08 MED ORDER — TRAMADOL HCL 50 MG PO TABS
50.0000 mg | ORAL_TABLET | Freq: Once | ORAL | Status: AC
  Administered 2013-02-08: 50 mg via ORAL

## 2013-02-08 MED ORDER — BUPIVACAINE IN DEXTROSE 0.75-8.25 % IT SOLN
INTRATHECAL | Status: AC
  Filled 2013-02-08: qty 2

## 2013-02-08 SURGICAL SUPPLY — 68 items
BAG HAMPER (MISCELLANEOUS) ×2 IMPLANT
BANDAGE ESMARK 6X9 LF (GAUZE/BANDAGES/DRESSINGS) ×1 IMPLANT
BIT DRILL 3.2X128 (BIT) ×2 IMPLANT
BLADE HEX COATED 2.75 (ELECTRODE) ×2 IMPLANT
BLADE SAG 18X100X1.27 (BLADE) ×2 IMPLANT
BLADE SAGITTAL 25.0X1.27X90 (BLADE) ×2 IMPLANT
BLADE SAW SAG 90X13X1.27 (BLADE) ×2 IMPLANT
BNDG ESMARK 6X9 LF (GAUZE/BANDAGES/DRESSINGS) ×2
BOWL SMART MIX CTS (DISPOSABLE) IMPLANT
CEMENT HV SMART SET (Cement) ×4 IMPLANT
CLOTH BEACON ORANGE TIMEOUT ST (SAFETY) ×2 IMPLANT
COVER LIGHT HANDLE STERIS (MISCELLANEOUS) ×6 IMPLANT
COVER PROBE W GEL 5X96 (DRAPES) ×2 IMPLANT
CUFF TOURNIQUET SINGLE 34IN LL (TOURNIQUET CUFF) ×2 IMPLANT
CUFF TOURNIQUET SINGLE 44IN (TOURNIQUET CUFF) IMPLANT
DECANTER SPIKE VIAL GLASS SM (MISCELLANEOUS) ×6 IMPLANT
DRAPE BACK TABLE (DRAPES) ×2 IMPLANT
DRAPE EXTREMITY T 121X128X90 (DRAPE) ×2 IMPLANT
DRSG MEPILEX BORDER 4X12 (GAUZE/BANDAGES/DRESSINGS) ×2 IMPLANT
DURAPREP 26ML APPLICATOR (WOUND CARE) ×6 IMPLANT
ELECT REM PT RETURN 9FT ADLT (ELECTROSURGICAL) ×2
ELECTRODE REM PT RTRN 9FT ADLT (ELECTROSURGICAL) ×1 IMPLANT
EVACUATOR 3/16  PVC DRAIN (DRAIN) ×1
EVACUATOR 3/16 PVC DRAIN (DRAIN) ×1 IMPLANT
FACESHIELD LNG OPTICON STERILE (SAFETY) ×2 IMPLANT
GLOVE BIOGEL PI IND STRL 7.0 (GLOVE) ×9 IMPLANT
GLOVE BIOGEL PI INDICATOR 7.0 (GLOVE) ×9
GLOVE ECLIPSE 6.5 STRL STRAW (GLOVE) ×6 IMPLANT
GLOVE OPTIFIT SS 8.0 STRL (GLOVE) ×4 IMPLANT
GLOVE SKINSENSE NS SZ8.0 LF (GLOVE) ×2
GLOVE SKINSENSE STRL SZ8.0 LF (GLOVE) ×2 IMPLANT
GLOVE SS BIOGEL STRL SZ 6.5 (GLOVE) ×3 IMPLANT
GLOVE SS BIOGEL STRL SZ 8 (GLOVE) ×1 IMPLANT
GLOVE SS N UNI LF 8.5 STRL (GLOVE) ×2 IMPLANT
GLOVE SUPERSENSE BIOGEL SZ 6.5 (GLOVE) ×3
GLOVE SUPERSENSE BIOGEL SZ 8 (GLOVE) ×1
GOWN STRL REIN XL XLG (GOWN DISPOSABLE) ×10 IMPLANT
HANDPIECE INTERPULSE COAX TIP (DISPOSABLE) ×1
HOOD W/PEELAWAY (MISCELLANEOUS) ×10 IMPLANT
INST SET MAJOR BONE (KITS) ×2 IMPLANT
IV NS IRRIG 3000ML ARTHROMATIC (IV SOLUTION) ×2 IMPLANT
KIT BLADEGUARD II DBL (SET/KITS/TRAYS/PACK) ×2 IMPLANT
KIT ROOM TURNOVER APOR (KITS) ×2 IMPLANT
MANIFOLD NEPTUNE II (INSTRUMENTS) ×2 IMPLANT
MARKER SKIN DUAL TIP RULER LAB (MISCELLANEOUS) ×2 IMPLANT
NEEDLE HYPO 21X1.5 SAFETY (NEEDLE) ×2 IMPLANT
NS IRRIG 1000ML POUR BTL (IV SOLUTION) ×2 IMPLANT
PACK TOTAL JOINT (CUSTOM PROCEDURE TRAY) ×2 IMPLANT
PAD ARMBOARD 7.5X6 YLW CONV (MISCELLANEOUS) ×2 IMPLANT
PAD DANNIFLEX CPM (ORTHOPEDIC SUPPLIES) ×2 IMPLANT
PIN TROCAR 3 INCH (PIN) ×2 IMPLANT
SET BASIN LINEN APH (SET/KITS/TRAYS/PACK) ×2 IMPLANT
SET HNDPC FAN SPRY TIP SCT (DISPOSABLE) ×1 IMPLANT
SPONGE GAUZE 4X4 12PLY (GAUZE/BANDAGES/DRESSINGS) IMPLANT
STAPLER VISISTAT 35W (STAPLE) ×2 IMPLANT
SUT BRALON NAB BRD #1 30IN (SUTURE) ×2 IMPLANT
SUT MNCRL 0 VIOLET CTX 36 (SUTURE) ×1 IMPLANT
SUT MON AB 2-0 CT1 36 (SUTURE) IMPLANT
SUT MONOCRYL 0 CTX 36 (SUTURE) ×1
SYR 20CC LL (SYRINGE) IMPLANT
SYR 30ML LL (SYRINGE) ×2 IMPLANT
SYR BULB IRRIGATION 50ML (SYRINGE) ×2 IMPLANT
TOWEL OR 17X26 4PK STRL BLUE (TOWEL DISPOSABLE) ×2 IMPLANT
TOWER CARTRIDGE SMART MIX (DISPOSABLE) ×2 IMPLANT
TRAY FOLEY CATH 14FR (SET/KITS/TRAYS/PACK) ×2 IMPLANT
WATER STERILE IRR 1000ML POUR (IV SOLUTION) ×8 IMPLANT
YANKAUER SUCT 12FT TUBE ARGYLE (SUCTIONS) ×4 IMPLANT
YANKAUER SUCT BULB TIP NO VENT (SUCTIONS) ×2 IMPLANT

## 2013-02-08 NOTE — Op Note (Signed)
Details of procedure:   Indications for procedure disabling knee pain, failure to control pain with nonoperative measures  Details of procedure:  In the preop area the patient's right  knee was marked and countersigned by the surgeon, the chart was updated, consent was signed  The patient was taken to the operating room for spinal anesthetic followed by administering 2 g of Ancef based on weight of <100 kg  A Foley catheter was inserted sterilely, then the operative right lower extremity was prepped and draped sterilely  The timeout was completed  The limb was then exsanguinated with a six-inch Esmarch with the knee in flexion and the tourniquet was elevated to 300 mm of mercury. A midline incision was made, the subcutaneous tissues were divided down to the extensor mechanism. A medial arthrotomy was performed, the patella was everted,  the fat pad was resected. The medial and  lateral menisci were resected. The medial soft tissue sleeve was elevated to the mid coronal plane. The anterior cruciate ligament and PCL were resected. Osteophytes were removed. Multiple loose bodies were removed at this point. The distal femur anterior surface was skeletonized with sharp dissection.  A three-eighths inch drill bit was used to enter the femoral canal which was suctioned and irrigated until the fluid was clear;  an intramedullary rod was placed in the femur with a 5 , right, 12 mm resection setting, the block was pinned in place; then an 12mm distal femoral resection was performed. The cut was checked for flatness.  The sizing guide was then placed on the femur, the femur measured a size 4-5; the size 4 block was pinned in external rotation 3 using the epicondyles as reference; a  4-in-1 cutting block was placed and the 4 cuts were made with retractors protecting the collateral ligaments. The posterior osteophytes were removed with a curved osteotome. Residual PCL tissue was resected; residual meniscal  tissue was resected.  The external tibial alignment instrument was set for tibial resection. The guide was placed referencing the lateral side and set for 8 mm resection. Anterior slope was built in to match the patients anatomy; a neutral varus valgus cut was set using the medial third of the tibial tubercle as reference along with the medial portion of the lateral tibial spine. The guide was stabilized with pins.  A saw was used to resect the anterior tibia. The tibia was sized to a size 4 base plate.  We then placed spacer blocks using a  10 and a 12.5 spacer block;  the knee was balanced  in extension and was balanced in flexion with the 12 spacer block   The box cut was then done using the box cutting guide. We then turned our attention to the patella.  The patella measured 25 mm we set the guide to leave 16 mm of patella.  After resection of the patella,  It was remeasured, and measured 14 mm. The size was 38. We then drilled the 3 peg holes.  We then did a trial reduction. The trial reduction was excellent with full extension, balanced in extension, balance in flexion. Passive flexion equalled 120, patella normal tracking.   We then punched the tibia per technique. We also placed extra drill holes in the medial tibial plateau secondary sclerotic bone, a small defect less than 5 mm was noted and plan was to fill that with cement  The bone was then irrigated and dried while cement was mixed on the back table. The implants were checked  for accuracy and then cemented in place; excess cement was removed; the cement was allowed to cure. The wound was then irrigated with copious amounts of saline, the posterior capsule was injected with 30 cc of Marcaine with epinephrine followed by 30 cc in the soft tissue. The 12.5 insert was placed. Range of motion matched trial reduction  The capsule was closed with #1 Bralon in interrupted and running fashion and then the joint was injected with 30 cc of Marcaine  with epinephrine.    The subcutaneous tissues were closed with  0 Monocryl and Monocryl in running fashion  Staples were used to reapproximate the skin edges.  Sterile dressings were applied.   The patient was then taken to the recovery room in stable condition.  Routine postop plan for knee replacement.    SURGEON:  Surgeon(s) and Role:    * Vickki Hearing, MD - Primary  PHYSICIAN ASSISTANT:   ASSISTANTS: Valetta Close, Wayne McFatter, Holly Proztec   ANESTHESIA:   spinal  EBL:  Total I/O In: 1300 [I.V.:1300] Out: 110 [Urine:100; Blood:10]  BLOOD ADMINISTERED:none  DRAINS: hemovac    LOCAL MEDICATIONS USED:  MARCAINE   , Amount: 60; .5%  ml and OTHER epi  SPECIMEN:  No Specimen  DISPOSITION OF SPECIMEN:  N/A  COUNTS:  YES  TOURNIQUET:   Total Tourniquet Time Documented: Thigh (Right) - 81 minutes Total: Thigh (Right) - 81 minutes   DICTATION: .Reubin Milan Dictation  PLAN OF CARE: Admit to inpatient   PATIENT DISPOSITION:  PACU - hemodynamically stable.   Delay start of Pharmacological VTE agent (>24hrs) due to surgical blood loss or risk of bleeding: yes

## 2013-02-08 NOTE — Transfer of Care (Signed)
Immediate Anesthesia Transfer of Care Note  Patient: Duane Castaneda  Procedure(s) Performed: Procedure(s): TOTAL KNEE ARTHROPLASTY (Right)  Patient Location: PACU  Anesthesia Type:Spinal  Level of Consciousness: awake, alert , oriented and patient cooperative  Airway & Oxygen Therapy: Patient Spontanous Breathing  Post-op Assessment: Report given to PACU RN and Post -op Vital signs reviewed and stable  Post vital signs: Reviewed and stable  Complications: No apparent anesthesia complications

## 2013-02-08 NOTE — Evaluation (Signed)
Physical Therapy Evaluation Patient Details Name: Duane Castaneda MRN: [id number] DOB: 28-Oct-1923 Today's Date: 02/08/2013 Time: 7846-9629 PT Time Calculation (min): 35 min  PT Assessment / Plan / Recommendation Clinical Impression  Pt is an 79 year old male admitted to [facility] for R TKR and referred to PT for general mobility.  At this time AROM in supine: 0-80 degrees, seated 10-90 (due to decreased strength).  Requires min-max A for general mobility.  Able to tolerate all activities with decreased pain at end of session.     PT Assessment  Patient needs continued PT services    Follow Up Recommendations  Home health PT    Does the patient have the potential to tolerate intense rehabilitation      Barriers to Discharge        Equipment Recommendations  Rolling walker with 5" wheels    Recommendations for Other Services     Frequency 7X/week    Precautions / Restrictions     Pertinent Vitals/Pain 5/10 pain, nursing in room and made aware      Mobility  Bed Mobility Bed Mobility: Supine to Sit;Sit to Supine;Scooting to HOB Supine to Sit: 4: Min assist;HOB elevated (30 degrees) Sit to Supine: 4: Min assist;HOB flat Scooting to HOB: 5: Supervision (cueing for sequeincing) Details for Bed Mobility Assistance: assistance for L LE Transfers Transfers: Sit to Stand;Stand to Sit Sit to Stand: 2: Max assist;With upper extremity assist;From bed Stand to Sit: 3: Mod assist;Without upper extremity assist;To bed Details for Transfer Assistance: bed lowered completly ground.  standing to RW.  cueing for hand placement for RW.  Ambulation/Gait Ambulation/Gait Assistance: Not tested (comment)    Exercises Total Joint Exercises Quad Sets: Right;10 reps;Supine (5 sec holds) Heel Slides: AROM;Left;20 reps;Supine Straight Leg Raises: AROM;Both;10 reps;Supine Goniometric ROM: Supine: AROM 0-80; Seated AROM: 10-90   PT Diagnosis: Difficulty walking;Generalized weakness;Acute pain  PT  Problem List: Decreased strength;Decreased range of motion;Decreased activity tolerance;Decreased mobility;Pain PT Treatment Interventions: DME instruction;Gait training;Stair training;Functional mobility training;Therapeutic activities;Therapeutic exercise;Patient/family education   PT Goals Acute Rehab PT Goals PT Goal Formulation: With patient Time For Goal Achievement: 02/15/13 Potential to Achieve Goals: Good Pt will go Supine/Side to Sit: with supervision;with HOB 0 degrees PT Goal: Supine/Side to Sit - Progress: Goal set today Pt will go Sit to Supine/Side: with modified independence;with HOB 0 degrees PT Goal: Sit to Supine/Side - Progress: Goal set today Pt will go Sit to Stand: with min assist;with upper extremity assist PT Goal: Sit to Stand - Progress: Goal set today Pt will go Stand to Sit: with min assist;with upper extremity assist PT Goal: Stand to Sit - Progress: Goal set today Pt will Transfer Bed to Chair/Chair to Bed: with min assist PT Transfer Goal: Bed to Chair/Chair to Bed - Progress: Goal set today Pt will Ambulate: 51 - 150 feet;with min assist;with least restrictive assistive device PT Goal: Ambulate - Progress: Goal set today Pt will Go Up / Down Stairs: 1-2 stairs;with min assist;with least restrictive assistive device PT Goal: Up/Down Stairs - Progress: Goal set today Pt will Perform Home Exercise Program: with supervision, verbal cues required/provided PT Goal: Perform Home Exercise Program - Progress: Goal set today  Visit Information  Last PT Received On: 02/08/13 Assistance Needed: +1    Subjective Data      Prior Functioning  Home Living Lives With: Family Available Help at Discharge: Family Type of Home: House Home Access: Stairs to enter Secretary/administrator of Steps: 1 Home Layout:  One level Bathroom Toilet: Standard Home Adaptive Equipment: Walker - four wheeled;Straight cane Prior Function Level of Independence: Independent with  assistive device(s) Able to Take Stairs?: Yes Communication Communication: No difficulties    Cognition       Extremity/Trunk Assessment Right Lower Extremity Assessment RLE ROM/Strength/Tone: [facility] for tasks assessed Left Lower Extremity Assessment LLE ROM/Strength/Tone: Deficits LLE ROM/Strength/Tone Deficits: hip flexion: 3/5, hip extension 3/5,  hip abduction 3/5, knee flexion 3/5, knee extension 2/5   Balance Balance Balance Assessed: Yes Static Sitting Balance Static Sitting - Balance Support: Feet supported;No upper extremity supported Static Sitting - Level of Assistance: 7: Independent Static Standing Balance Static Standing - Balance Support: Bilateral upper extremity supported Static Standing - Level of Assistance: 5: Stand by assistance Static Standing - Comment/# of Minutes: 3 minutes   End of Session PT - End of Session Equipment Utilized During Treatment: Gait belt Patient left: in bed;with family/visitor present Nurse Communication: Mobility status  GP     Duane Castaneda 02/08/2013, 3:44 PM

## 2013-02-08 NOTE — Anesthesia Procedure Notes (Addendum)
Date/Time: 02/08/2013 7:25 AM Performed by: Carolyne Littles, AMY L Pre-anesthesia Checklist: Patient identified, Patient being monitored, Emergency Drugs available, Timeout performed and Suction available Oxygen Delivery Method: Non-rebreather mask   Spinal  Patient location during procedure: OR Start time: 02/08/2013 7:38 AM Staffing CRNA/Resident: ANDRAZA, AMY L Preanesthetic Checklist Completed: patient identified, site marked, surgical consent, pre-op evaluation, timeout performed, IV checked, risks and benefits discussed and monitors and equipment checked Spinal Block Patient position: right lateral decubitus Prep: Betadine Patient monitoring: heart rate, cardiac monitor, continuous pulse ox and blood pressure Approach: right paramedian Location: L3-4 Injection technique: single-shot Needle Needle type: Spinocan  Needle gauge: 22 G Needle length: 9 cm Assessment Sensory level: T8 Additional Notes ATTEMPTS:1 TRAY MV:[id number] TRAY EXPIRATION DATE:11/2013 Marcaine 15mg , fentanyl 20 mcg,epi.1 injected intrathecally at 0738.  Patient tolerated well.

## 2013-02-08 NOTE — Progress Notes (Signed)
Awake. Rt pedal pulse palpable. Rt toes pink in color/warm to touch. Heels off bed. Denies pain.

## 2013-02-08 NOTE — Anesthesia Postprocedure Evaluation (Addendum)
  Anesthesia Post-op Note  Patient: Duane Castaneda  Procedure(s) Performed: Procedure(s): TOTAL KNEE ARTHROPLASTY (Right)  Patient Location: PACU  Anesthesia Type:Spinal  Level of Consciousness: awake, alert , oriented and patient cooperative  Airway and Oxygen Therapy: Patient Spontanous Breathing and Patient connected to nasal cannula oxygen  Post-op Pain: none  Post-op Assessment: Post-op Vital signs reviewed, Patient's Cardiovascular Status Stable, Respiratory Function Stable, Patent Airway, No signs of Nausea or vomiting and Pain level controlled  Post-op Vital Signs: Reviewed and stable  Complications: No apparent anesthesia complications 02/09/13 Patient doing well.  No apparent anesthesia complications.

## 2013-02-08 NOTE — Brief Op Note (Signed)
02/08/2013  9:32 AM  PATIENT:  Duane Castaneda  79 y.o. male  PRE-OPERATIVE DIAGNOSIS:  osteoarthritis right knee  POST-OPERATIVE DIAGNOSIS:  osteoarthritis right knee  FINDINGS: Severe varus deformity 15 flexion contracture, multiple loose bodies, posterior compartment osteophytes, severe osteoarthritis medial compartment. Peripheral osteophytes throughout the joint.  Implant Depew fixed-bearing posterior stabilized Sigma total knee arthroplasty 12.5 polyethylene insert size 4 femur size 4 tibia size 38 patella  PROCEDURE:  Procedure(s): TOTAL KNEE ARTHROPLASTY (Right)   Details of procedure:   Indications for procedure disabling knee pain, failure to control pain with nonoperative measures  Details of procedure:  In the preop area the patient's right  knee was marked and countersigned by the surgeon, the chart was updated, consent was signed  The patient was taken to the operating room for spinal anesthetic followed by administering 2 g of Ancef based on weight of <100 kg  A Foley catheter was inserted sterilely, then the operative right lower extremity was prepped and draped sterilely  The timeout was completed  The limb was then exsanguinated with a six-inch Esmarch with the knee in flexion and the tourniquet was elevated to 300 mm of mercury. A midline incision was made, the subcutaneous tissues were divided down to the extensor mechanism. A medial arthrotomy was performed, the patella was everted,  the fat pad was resected. The medial and  lateral menisci were resected. The medial soft tissue sleeve was elevated to the mid coronal plane. The anterior cruciate ligament and PCL were resected. Osteophytes were removed. Multiple loose bodies were removed at this point. The distal femur anterior surface was skeletonized with sharp dissection.  A three-eighths inch drill bit was used to enter the femoral canal which was suctioned and irrigated until the fluid was clear;  an  intramedullary rod was placed in the femur with a 5 , right, 12 mm resection setting, the block was pinned in place; then an 12mm distal femoral resection was performed. The cut was checked for flatness.  The sizing guide was then placed on the femur, the femur measured a size 4-5; the size 4 block was pinned in external rotation 3 using the epicondyles as reference; a  4-in-1 cutting block was placed and the 4 cuts were made with retractors protecting the collateral ligaments. The posterior osteophytes were removed with a curved osteotome. Residual PCL tissue was resected; residual meniscal tissue was resected.  The external tibial alignment instrument was set for tibial resection. The guide was placed referencing the lateral side and set for 8 mm resection. Anterior slope was built in to match the patients anatomy; a neutral varus valgus cut was set using the medial third of the tibial tubercle as reference along with the medial portion of the lateral tibial spine. The guide was stabilized with pins.  A saw was used to resect the anterior tibia. The tibia was sized to a size 4 base plate.  We then placed spacer blocks using a  10 and a 12.5 spacer block;  the knee was balanced  in extension and was balanced in flexion with the 12 spacer block   The box cut was then done using the box cutting guide. We then turned our attention to the patella.  The patella measured 25 mm we set the guide to leave 16 mm of patella.  After resection of the patella,  It was remeasured, and measured 14 mm. The size was 38. We then drilled the 3 peg holes.  We then did a  trial reduction. The trial reduction was excellent with full extension, balanced in extension, balance in flexion. Passive flexion equalled 120, patella normal tracking.   We then punched the tibia per technique. We also placed extra drill holes in the medial tibial plateau secondary sclerotic bone, a small defect less than 5 mm was noted and plan was to  fill that with cement  The bone was then irrigated and dried while cement was mixed on the back table. The implants were checked for accuracy and then cemented in place; excess cement was removed; the cement was allowed to cure. The wound was then irrigated with copious amounts of saline, the posterior capsule was injected with 30 cc of Marcaine with epinephrine followed by 30 cc in the soft tissue. The 12.5 insert was placed. Range of motion matched trial reduction  The capsule was closed with #1 Bralon in interrupted and running fashion and then the joint was injected with 30 cc of Marcaine with epinephrine.    The subcutaneous tissues were closed with  0 Monocryl and Monocryl in running fashion  Staples were used to reapproximate the skin edges.  Sterile dressings were applied.   The patient was then taken to the recovery room in stable condition.  Routine postop plan for knee replacement.    SURGEON:  Surgeon(s) and Role:    * Vickki Hearing, MD - Primary  PHYSICIAN ASSISTANT:   ASSISTANTS: Valetta Close, Wayne McFatter, Holly Proztec   ANESTHESIA:   spinal  EBL:  Total I/O In: 1300 [I.V.:1300] Out: 110 [Urine:100; Blood:10]  BLOOD ADMINISTERED:none  DRAINS: hemovac    LOCAL MEDICATIONS USED:  MARCAINE   , Amount: 60; .5%  ml and OTHER epi  SPECIMEN:  No Specimen  DISPOSITION OF SPECIMEN:  N/A  COUNTS:  YES  TOURNIQUET:   Total Tourniquet Time Documented: Thigh (Right) - 81 minutes Total: Thigh (Right) - 81 minutes   DICTATION: .Reubin Milan Dictation  PLAN OF CARE: Admit to inpatient   PATIENT DISPOSITION:  PACU - hemodynamically stable.   Delay start of Pharmacological VTE agent (>24hrs) due to surgical blood loss or risk of bleeding: yes

## 2013-02-08 NOTE — Anesthesia Preprocedure Evaluation (Addendum)
Anesthesia Evaluation  Patient identified by MRN, date of birth, ID band Patient awake    Reviewed: Allergy & Precautions, H&P , NPO status , Patient's Chart, lab work & pertinent test results  Airway Mallampati: II TM Distance: >3 FB     Dental  (+) Edentulous Upper and Edentulous Lower   Pulmonary former smoker,  breath sounds clear to auscultation        Cardiovascular hypertension, Pt. on medications Rhythm:Regular Rate:Normal     Neuro/Psych    GI/Hepatic GERD-  Medicated and Controlled,  Endo/Other  Hypothyroidism   Renal/GU      Musculoskeletal   Abdominal   Peds  Hematology   Anesthesia Other Findings Colon Ca hx Lymphoma hx   Reproductive/Obstetrics                           Anesthesia Physical Anesthesia Plan  ASA: III  Anesthesia Plan: Spinal   Post-op Pain Management:    Induction:   Airway Management Planned: Nasal Cannula  Additional Equipment:   Intra-op Plan:   Post-operative Plan:   Informed Consent: I have reviewed the patients History and Physical, chart, labs and discussed the procedure including the risks, benefits and alternatives for the proposed anesthesia with the patient or authorized representative who has indicated his/her understanding and acceptance.     Plan Discussed with:   Anesthesia Plan Comments:         Anesthesia Quick Evaluation

## 2013-02-08 NOTE — Interval H&P Note (Signed)
History and Physical Interval Note:  02/08/2013 7:16 AM  Duane Castaneda  has presented today for surgery, with the diagnosis of osteoarthritis right knee  The various methods of treatment have been discussed with the patient and family. After consideration of risks, benefits and other options for treatment, the patient has consented to  Procedure(s): TOTAL KNEE ARTHROPLASTY (Right) as a surgical intervention .  The patient's history has been reviewed, patient examined, no change in status, stable for surgery.  I have reviewed the patient's chart and labs.  Questions were answered to the patient's satisfaction.     RIGHT TOTAL KNEE   [name]

## 2013-02-08 NOTE — Preoperative (Signed)
Beta Blockers   Reason not to administer Beta Blockers:Not Applicable 

## 2013-02-08 NOTE — Progress Notes (Signed)
hemovac to rt knee in place and uncharged. Draining red drainage.

## 2013-02-08 NOTE — H&P (Signed)
Dare, Sanger Male, 79 y.o., 06/03/1923  Location: P 4  Bed: NONE  MRN: [id number]  CSN: [id number]  Patient is being admitted for right total knee arthroplasty.  Subjective:  Chief Complaint:right knee pain.  HPI: ERMA JOUBERT, 79 y.o. male, has a history of pain and functional disability in the right knee due to arthritis and has failed non-surgical conservative treatments for greater than 12 weeks to includeNSAID's and/or analgesics, corticosteriod injections, use of assistive devices and activity modification. Onset of symptoms was gradual, starting >10 years ago with gradually worsening course since that time. The patient noted no past surgery on the right knee(s). Patient currently rates pain in the right knee(s) at 7 out of 10 with activity. Patient has night pain, worsening of pain with activity and weight bearing, pain that interferes with activities of daily living, pain with passive range of motion, crepitus and joint swelling. Patient has evidence of subchondral cysts, subchondral sclerosis, periarticular osteophytes, joint subluxation and joint space narrowing by imaging studies. There is no active infection.  This patient has had cardiac workup echocardiogra  oncology consult and medical consult and has been cleared for surgery .  He and his daughter understand the risk of this procedure and the possible complications and they're willing to take those risks. Risks that were discussed include but are not limited to bleeding infection heart attack heart disease and death.  Review of systems currently normal  Patient Active Problem List    Diagnosis  Date Noted   .  Preoperative evaluation to rule out surgical contraindication  12/30/2012   .  Essential hypertension, benign  12/30/2012   .  Port catheter in place  08/21/2012   .  OA (osteoarthritis) of knee  09/03/2011    Past Medical History   Diagnosis  Date   .  Lymphoma      Diffuse large B cell status post R [facility] - Dr.  Mariel Sleet   .  Follicular thyroid carcinoma      Dr. Gerrit Friends   .  History of colon cancer      Status post resection in 1993   .  Essential hypertension, benign    .  Degenerative joint disease    .  Port catheter in place  08/21/2012   .  Hypothyroidism    .  Cataracts, bilateral    .  Detached retina    .  GERD (gastroesophageal reflux disease)     Past Surgical History   Procedure  Date   .  Detached retina left eye    .  Bilateral cataract surgery    .  Colon cancer resection    .  Hernia repair    .  Lymphoma surgery    .  Thyroid cancer surgery    .  Transurethral resection of prostate    .  Left knee replacement      Dr. Arletha Grippe   (Not in a hospital admission)  No Known Allergies  History   Substance Use Topics   .  Smoking status:  Never Smoker   .  Smokeless tobacco:  Never Used   .  Alcohol Use:  No    Family History   Problem  Relation  Age of Onset   .  Arthritis     .  Cancer     .  Lung disease     ROS  Objective:  Physical Exam  Vital signs in last 24 hours:  BP 142/70  Ht 5\' 9"  (1.753 m)  Wt 200 lb (90.719 kg)  BMI 29.53 kg/m2  Overall the patient looks to be in good condition. He has a reasonable weight is oriented x3 his mood and affect are normal he is ambulating with his assisted devices as severe varus thrust and he uses a cane  The right knee is in severe varus although he is maintained 120 of knee flexion he has a less than 5 flexion contracture excellent extension power in his knee remained stable. His skin shows discoloration from venous stasis disease which is mild and not active at this time. Has good distal pulse no peripheral edema or swelling no lymphadenopathy normal sensation good reflexes and he has good balance  His left knee had knee replacement it has 110 of knee flexion less than 5 flexion contracture well-healed incision good strength normal stability and no malalignment  Upper extremity exam  The right and left upper  extremity:  Inspection and palpation revealed no abnormalities in the upper extremities.  Range of motion is full without contracture.  Motor exam is normal with grade 5 strength.  The joints are fully reduced without subluxation.  There is no atrophy or tremor and muscle tone is normal. All joints are stable. Labs:  Estimated Body mass index is 29.53 kg/(m^2) as calculated from the following:  Height as of this encounter: 5\' 9" (1.753 m).  Weight as of this encounter: 200 lb(90.719 kg).  Imaging Review  Plain radiographs demonstrate severe degenerative joint disease of the right knee(s). The overall alignment issignificant varus. The bone quality appears to be good for age and reported activity level.  Assessment/Plan:  End stage arthritis, right knee  The patient history, physical examination, clinical judgment of the Alexei Ey and imaging studies are consistent with end stage degenerative joint disease of the right knee(s) and total knee arthroplasty is deemed medically necessary. The treatment options including medical management, injection therapy arthroscopy and arthroplasty were discussed at length. The risks and benefits of total knee arthroplasty were presented and reviewed. The risks due to aseptic loosening, infection, stiffness, patella tracking problems, thromboembolic complications and other imponderables were discussed. The patient acknowledged the explanation, agreed to proceed with the plan and consent was signed. Patient is being admitted for inpatient treatment for surgery, pain control, PT, OT, prophylactic antibiotics, VTE prophylaxis, progressive ambulation and ADL's and discharge planning. The patient is planning to be discharged to skilled nursing facility

## 2013-02-09 LAB — BASIC METABOLIC PANEL
BUN: 31 mg/dL — ABNORMAL HIGH (ref 6–23)
CO2: 27 mEq/L (ref 19–32)
Chloride: 103 mEq/L (ref 96–112)
GFR calc Af Amer: 39 mL/min — ABNORMAL LOW (ref >90)
Potassium: 4 mEq/L (ref 3.5–5.1)

## 2013-02-09 LAB — CBC
HCT: 26.5 % — ABNORMAL LOW (ref 39.0–52.0)
MCV: 89.5 fL (ref 78.0–100.0)
Platelets: 108 10*3/uL — ABNORMAL LOW (ref 150–400)
RBC: 2.96 MIL/uL — ABNORMAL LOW (ref 4.22–5.81)
RDW: 14.9 % (ref 11.5–15.5)
WBC: 9.4 10*3/uL (ref 4.0–10.5)

## 2013-02-09 NOTE — Progress Notes (Signed)
Patient has been unable to void after removal of catheter. Bladder scan obtained and 241 ml present in bladder. Patient has no complaints of pain or fullness.  Dr. Romeo Apple notified. Following protocol for catheter removal.

## 2013-02-09 NOTE — Evaluation (Signed)
Occupational Therapy Evaluation Patient Details Name: Duane Castaneda MRN: [id number] DOB: 1923/08/22 Today's Date: 02/09/2013 Time: 4098-1191 OT Time Calculation (min): 23 min  OT Assessment / Plan / Recommendation Clinical Impression  Patient is a 79 y/o male s/p Left TKA presenting to acute OT with deficits below. Patient will benefit from OT services to increase ADL performance and functional transfers. Recommend SNF at D/C secondary to increase assistance required at eval for stand pivot transfer.    OT Assessment  Patient needs continued OT Services    Follow Up Recommendations  SNF    Barriers to Discharge None Patient requires increased assistance with transfers at this time.  Equipment Recommendations  3 in 1 bedside comode       Frequency  Min 2X/week    Precautions / Restrictions Precautions Precautions: Fall Restrictions Weight Bearing Restrictions: Yes LLE Weight Bearing: Weight bearing as tolerated   Pertinent Vitals/Pain Faces 8    ADL  Toilet Transfer: Performed;+2 Total assistance Toilet Transfer: Patient Percentage: 10% Toilet Transfer Method: Stand pivot Acupuncturist:  (to recliner) Equipment Used: Gait belt;Rolling walker Transfers/Ambulation Related to ADLs: Patient transfers at a Total A x2 stand pivot transfer to recliner.    OT Diagnosis: Generalized weakness  OT Problem List: Decreased activity tolerance;Impaired balance (sitting and/or standing);Decreased knowledge of use of DME or AE;Decreased safety awareness;Decreased strength;Pain OT Treatment Interventions: Self-care/ADL training;Therapeutic activities;Therapeutic exercise;DME and/or AE instruction;Manual therapy;Balance training;Patient/family education   OT Goals Acute Rehab OT Goals OT Goal Formulation: With patient Time For Goal Achievement: 02/23/13 Potential to Achieve Goals: Good ADL Goals Pt Will Perform Lower Body Bathing: with min assist ADL Goal: Lower Body  Bathing - Progress: Goal set today Pt Will Perform Lower Body Dressing: with min assist ADL Goal: Lower Body Dressing - Progress: Goal set today Pt Will Transfer to Toilet: with mod assist;Stand pivot transfer;3-in-1 ADL Goal: Toilet Transfer - Progress: Goal set today Pt Will Perform Toileting - Clothing Manipulation: with supervision ADL Goal: Toileting - Clothing Manipulation - Progress: Goal set today Pt Will Perform Toileting - Hygiene: with supervision ADL Goal: Toileting - Hygiene - Progress: Goal set today Arm Goals Pt Will Complete Theraband Exer: with supervision, verbal cues required/provided;2 sets;15 reps;Level 2 Theraband;Bilateral upper extremities;to maintain strength Arm Goal: Theraband Exercises - Progress: Goal set today Miscellaneous OT Goals Miscellaneous OT Goal #1: Patient will increase static standing balance to Min Assist to increase functional performance during transfers to bedside commode. OT Goal: Miscellaneous Goal #1 - Progress: Goal set today  Visit Information  Last OT Received On: 02/09/13 PT/OT Co-Evaluation/Treatment: Yes    Subjective Data  Subjective: "I do my own bathing and dressing at home." Patient Stated Goal: To go home.   Prior Functioning     Home Living Lives With: Family Available Help at Discharge: Family;Available 24 hours/day Type of Home: House Home Access: Stairs to enter [employer] of Steps: 1 Home Layout: One level Bathroom Shower/Tub: Engineer, manufacturing systems: Standard Home Adaptive Equipment: Dan Humphreys - four wheeled;Straight cane;Shower chair with back Prior Function Level of Independence: Needs assistance Needs Assistance: Meal Prep;Light Housekeeping Meal Prep: Total Light Housekeeping: Total Able to Take Stairs?: Yes Driving: No Vocation: Retired Musician: No difficulties Dominant Hand: Right         Vision/Perception Vision - History Baseline Vision: Wears glasses  all the time Patient Visual Report: No change from baseline Vision - Assessment Eye Alignment: Within Functional Limits   Cognition  Cognition Overall Cognitive Status: Impaired  Area of Impairment: Attention;Following commands;Problem solving Arousal/Alertness: Lethargic (alert at first then became lethargic) Orientation Level: Appears intact for tasks assessed Behavior During Session: [facility] for tasks performed Following Commands: Follows one step commands consistently    Extremity/Trunk Assessment Right Upper Extremity Assessment RUE ROM/Strength/Tone: Within functional levels Left Upper Extremity Assessment LUE ROM/Strength/Tone: Within functional levels     Mobility Bed Mobility Bed Mobility: Supine to Sit;Sit to Supine;Scooting to HOB Supine to Sit: 5: Supervision;HOB flat (30 degrees) Sit to Supine: 4: Min assist;HOB flat Scooting to HOB: 5: Supervision (cueing for sequeincing) Details for Bed Mobility Assistance: assistance for L LE Transfers Sit to Stand: 2: Max assist;With upper extremity assist;From bed Stand to Sit: 3: Mod assist;Without upper extremity assist;To bed;2: Max assist Details for Transfer Assistance: Bed elevated today, pt requires A to move LLE due to pain to RLE>      Exercise Total Joint Exercises Quad Sets: Right;10 reps;Supine (5 sec holds) Short Arc Quad: AAROM;10 reps;Supine;AROM;Left Heel Slides: AAROM;20 reps;Supine Hip ABduction/ADduction: AROM;10 reps;Left Straight Leg Raises: AROM;10 reps;Left;Supine Goniometric ROM: 8-70      End of Session OT - End of Session Equipment Utilized During Treatment: Gait belt (rolling walker) Activity Tolerance: Patient tolerated treatment well Patient left: in chair;with call bell/phone within reach Nurse Communication: Mobility status;Other (comment) (no chair alarm)    Limmie Patricia, OTR/L  02/09/2013, 10:37 AM

## 2013-02-09 NOTE — Progress Notes (Signed)
Repeated bladder scan on patient and scanner showed 274 ml. Patient was straight cathed and  275 ml was drained from bladder.

## 2013-02-09 NOTE — Clinical Social Work Psychosocial (Signed)
Clinical Social Work Department BRIEF PSYCHOSOCIAL ASSESSMENT 02/09/2013  Patient:  Duane Castaneda, Duane Castaneda     Account Number:  [id number]     Admit date:  02/08/2013  Clinical Social Worker:  Nancie Neas  Date/Time:  02/09/2013 11:50 AM  Referred by:  Physician  Date Referred:  02/09/2013 Referred for  SNF Placement   Other Referral:   Interview type:  Patient Other interview type:   and daughter, Beth    PSYCHOSOCIAL DATA Living Status:  FAMILY Admitted from facility:   Level of care:   Primary support name:  Beth Primary support relationship to patient:  CHILD, ADULT Degree of support available:   very supportive    CURRENT CONCERNS Current Concerns  Post-Acute Placement   Other Concerns:    SOCIAL WORK ASSESSMENT / PLAN CSW met with pt and pt's daughter at bedside. Pt alert but did not participate much in conversation due to pain medication. He lives with his daughter [name] who appears to be very involved and supportive. Pt was independent in personal care prior to surgery. His daughter did the cooking, cleaning, and other household duties. Pt was using a cane. He is still driving, and met with his friends daily with breakfast. Beth reports they were open to either home health or SNF based on pt's response to therapy. Original recommendation was for home health, but pt did not do as well today and PT is recommending SNF. CSW discussed placement process and they are agreeable. Requesting [facility] if possible. CSW will fax out FL2 and follow up with bed offers when available. They are aware of Medicare coverage/criteria.   Assessment/plan status:  Psychosocial Support/Ongoing Assessment of Needs Other assessment/ plan:   Information/referral to community resources:   SNF list    PATIENT'S/FAMILY'S RESPONSE TO PLAN OF CARE: Pt and daughter agreeable to [facility] for rehab.        Derenda Fennel, [place] [phone]

## 2013-02-09 NOTE — Care Management Note (Signed)
    Page 1 of 1   02/11/2013     10:35:28 AM   CARE MANAGEMENT NOTE 02/11/2013  Patient:  Duane Castaneda, Duane Castaneda   Account Number:  [id number]  Date Initiated:  02/09/2013  Documentation initiated by:  Sharrie Rothman  Subjective/Objective Assessment:   Pt admitted from home s/p right total knee. Pt lives with his daughter but is needing SNF at discharge. Pt was fairly independent with ADL's prior to surgery.     Action/Plan:   CSW is aware of placement and will start bed search.   Anticipated DC Date:  02/11/2013   Anticipated DC Plan:  SKILLED NURSING FACILITY  In-house referral  Clinical Social Worker      DC Planning Services  CM consult      Choice offered to / List presented to:             Status of service:  Completed, signed off Medicare Important Message given?  YES (If response is "NO", the following Medicare IM given date fields will be blank) Date Medicare IM given:  02/11/2013 Date Additional Medicare IM given:    Discharge Disposition:  SKILLED NURSING FACILITY  Per UR Regulation:    If discussed at Long Length of Stay Meetings, dates discussed:    Comments:  02/11/13 1033 Arlyss Queen, RN BSN CM Pt discharged to [facility] today. CSW will arrange discharge to facility.  02/09/13 1345 Arlyss Queen, RN BSN CM

## 2013-02-09 NOTE — Progress Notes (Signed)
Physical Therapy Treatment Patient Details Name: Duane Castaneda MRN: [id number] DOB: Dec 10, 1922 Today's Date: 02/09/2013 Time: 4098-1191 PT Time Calculation (min): 33 min  PT Assessment / Plan / Recommendation Comments on Treatment Session  Pt has a dramatic change from yesterday and today requires max A-+2 total A for standing transfers due to inability to lift L LE off floor.  Pt reports during session he felt dizzy and was slow to respond to questions today.  Due to this SNF is a better fit for him at this point, if patient has improved mobility later today and tomorrow, may revise to HHPT.     Follow Up Recommendations  SNF     Does the patient have the potential to tolerate intense rehabilitation     Barriers to Discharge        Equipment Recommendations       Recommendations for Other Services    Frequency     Plan Discharge plan needs to be updated    Precautions / Restrictions Precautions Precautions: Fall Restrictions Weight Bearing Restrictions: Yes LLE Weight Bearing: Weight bearing as tolerated   Pertinent Vitals/Pain FACES: 6/10 to L knee    Mobility  Bed Mobility Bed Mobility: Supine to Sit;Sit to Supine;Scooting to HOB Supine to Sit: 5: Supervision;HOB flat (30 degrees) Sit to Supine: 4: Min assist;HOB flat Scooting to HOB: 5: Supervision (cueing for sequeincing) Details for Bed Mobility Assistance: assistance for L LE Transfers Transfers: Sit to Stand;Stand to Sit;Stand Pivot Transfers Sit to Stand: 2: Max assist;With upper extremity assist;From bed Stand to Sit: 3: Mod assist;Without upper extremity assist;To bed;2: Max assist Stand Pivot Transfers: 1: +2 Total assist Stand Pivot Transfers: Patient Percentage: 10% Details for Transfer Assistance: Bed elevated today, pt requires A to move LLE due to pain to RLE>  Ambulation/Gait Ambulation/Gait Assistance: Not tested (comment)    Exercises Total Joint Exercises Quad Sets: Right;10 reps;Supine (5 sec  holds) Short Arc Quad: AAROM;10 reps;Supine;AROM;Left Heel Slides: AAROM;20 reps;Supine Hip ABduction/ADduction: AROM;10 reps;Left Straight Leg Raises: AROM;10 reps;Left;Supine Goniometric ROM: 8-70   PT Diagnosis:    PT Problem List:   PT Treatment Interventions:     PT Goals Acute Rehab PT Goals PT Goal Formulation: With patient Time For Goal Achievement: 02/15/13 Potential to Achieve Goals: Good Pt will go Supine/Side to Sit: with supervision;with HOB 0 degrees PT Goal: Supine/Side to Sit - Progress: Progressing toward goal Pt will go Sit to Supine/Side: with HOB 0 degrees;with min assist PT Goal: Sit to Supine/Side - Progress: Progressing toward goal Pt will go Sit to Stand: with min assist;with upper extremity assist PT Goal: Sit to Stand - Progress: Not progressing Pt will go Stand to Sit: with min assist;with upper extremity assist PT Goal: Stand to Sit - Progress: Not progressing Pt will Transfer Bed to Chair/Chair to Bed: with min assist PT Transfer Goal: Bed to Chair/Chair to Bed - Progress: Not progressing Pt will Ambulate: 51 - 150 feet;with min assist;with least restrictive assistive device Pt will Go Up / Down Stairs: 1-2 stairs;with min assist;with least restrictive assistive device Pt will Perform Home Exercise Program: with supervision, verbal cues required/provided  Visit Information  Last PT Received On: 02/09/13 PT/OT Co-Evaluation/Treatment: Yes    Subjective Data  Subjective: Pt was in pain before treatment.  Pt has been given pain medication.    Cognition       Balance     End of Session PT - End of Session Equipment Utilized During Treatment: Gait belt  Nurse Communication: Mobility status   GP     LISA MASSIE 02/09/2013, 10:31 AM

## 2013-02-09 NOTE — Progress Notes (Signed)
Physical Therapy Treatment Patient Details Name: Duane Castaneda MRN: [id number] DOB: 12-Jan-1923 Today's Date: 02/09/2013 Time: 1455-1540 PT Time Calculation (min): 45 min  PT Assessment / Plan / Recommendation Comments on Treatment Session  Pt continues to require max A for transfers from chair to bed.  has improved sequencing w/STS activities w/UE A.  Continues to be unable to ambulate.    Follow Up Recommendations        Does the patient have the potential to tolerate intense rehabilitation     Barriers to Discharge        Equipment Recommendations       Recommendations for Other Services    Frequency     Plan Discharge plan remains appropriate    Precautions / Restrictions     Pertinent Vitals/Pain 6/10 after treatment.     Mobility  Transfers Transfers: Sit to Stand;Stand to Sit Sit to Stand: 2: Max assist;With upper extremity assist;From bed Stand to Sit: With upper extremity assist;To chair/3-in-1 Stand Pivot Transfers: 2: Max assist Details for Transfer Assistance: STS repeated 6x for instruction and activity tolerance.     Exercises Total Joint Exercises Long Arc Quad: AROM;Strengthening;Left;10 reps;Seated Marching in Standing: Seated;20 reps;Both General Exercises - Lower Extremity Hip Flexion/Marching: AROM;Strengthening;20 reps;Both Heel Raises: AROM;Both;20 reps;Seated   PT Diagnosis:    PT Problem List:   PT Treatment Interventions:     PT Goals    Visit Information  Last PT Received On: 02/09/13    Subjective Data      Cognition       Balance     End of Session PT - End of Session Equipment Utilized During Treatment: Gait belt Activity Tolerance: Patient limited by fatigue Patient left: in bed;in CPM Nurse Communication: Mobility status;Weight bearing status CPM Right Knee Right Knee Flexion (Degrees): 75 Right Knee Extension (Degrees): 0     Deneene Tarver, PT 02/09/2013, 3:45 PM

## 2013-02-09 NOTE — Clinical Social Work Placement (Signed)
Clinical Social Work Department CLINICAL SOCIAL WORK PLACEMENT NOTE 02/09/2013  Patient:  Duane Castaneda, Duane Castaneda  Account Number:  [id number] Admit date:  02/08/2013  Clinical Social Worker:  Derenda Fennel, LCSW  Date/time:  02/09/2013 11:45 AM  Clinical Social Work is seeking post-discharge placement for this patient at the following level of care:   SKILLED NURSING   (*CSW will update this form in Epic as items are completed)   02/09/2013  Patient/family provided with [facility] Department of Clinical Social Work's list of facilities offering this level of care within the geographic area requested by the patient (or if unable, by the patient's family).  02/09/2013  Patient/family informed of their freedom to choose among providers that offer the needed level of care, that participate in Medicare, Medicaid or managed care program needed by the patient, have an available bed and are willing to accept the patient.  02/09/2013  Patient/family informed of [facility]' ownership interest in [facility], as well as of the fact that they are under no obligation to receive care at this facility.  PASARR submitted to [employer] on 02/09/2013 PASARR number received from [employer] on 02/09/2013  FL2 transmitted to all facilities in geographic area requested by pt/family on  02/09/2013 FL2 transmitted to all facilities within larger geographic area on   Patient informed that his/her managed care company has contracts with or will negotiate with  certain facilities, including the following:     Patient/family informed of bed offers received:   Patient chooses bed at  Physician recommends and patient chooses bed at    Patient to be transferred to  on   Patient to be transferred to facility by   The following physician request were entered in Epic:   Additional Comments:  Derenda Fennel, LCSW [phone]

## 2013-02-09 NOTE — Progress Notes (Signed)
UR Chart Review Completed  

## 2013-02-10 LAB — CBC
HCT: 24.6 % — ABNORMAL LOW (ref 39.0–52.0)
Hemoglobin: 8.2 g/dL — ABNORMAL LOW (ref 13.0–17.0)
MCV: 89.1 fL (ref 78.0–100.0)
WBC: 10.1 10*3/uL (ref 4.0–10.5)

## 2013-02-10 LAB — GLUCOSE, CAPILLARY: Glucose-Capillary: 146 mg/dL — ABNORMAL HIGH (ref 70–99)

## 2013-02-10 LAB — PREPARE RBC (CROSSMATCH)

## 2013-02-10 MED ORDER — FERROUS SULFATE 325 (65 FE) MG PO TABS
325.0000 mg | ORAL_TABLET | Freq: Three times a day (TID) | ORAL | Status: DC
  Administered 2013-02-10 – 2013-02-11 (×5): 325 mg via ORAL
  Filled 2013-02-10 (×5): qty 1

## 2013-02-10 MED ORDER — METHOCARBAMOL 500 MG PO TABS: 500.0000 mg | ORAL_TABLET | Freq: Four times a day (QID) | ORAL | Status: DC | PRN

## 2013-02-10 MED ORDER — TRAMADOL HCL 50 MG PO TABS: 50.0000 mg | ORAL_TABLET | Freq: Four times a day (QID) | ORAL | Status: DC | PRN

## 2013-02-10 MED ORDER — FUROSEMIDE 20 MG PO TABS
20.0000 mg | ORAL_TABLET | Freq: Two times a day (BID) | ORAL | Status: AC
  Administered 2013-02-10 (×2): 20 mg via ORAL
  Filled 2013-02-10 (×2): qty 1

## 2013-02-10 NOTE — Clinical Social Work Note (Signed)
CSW presented bed offers to pt and pt's daughter who choose [facility]. Facility notified. Awaiting stability for d/c. CSW to continue to follow.  Derenda Fennel, [place] [phone]

## 2013-02-10 NOTE — Progress Notes (Signed)
Patient incontinent of urine

## 2013-02-10 NOTE — Progress Notes (Signed)
Patient ID: Duane Castaneda, male   DOB: 1923-02-04, 79 y.o.   MRN: [id number] This progress note is for Tuesday, March 4. The patient was seen in note was omitted  Yesterday was postoperative day #1 the patient was in stable condition and adequate pain control blood pressure was stable.  Neurovascular exam was intact  Assessment stable postoperative day #1 after knee replacement  Start physical therapy, CPM machine under blood pressure and laboratory studies.

## 2013-02-10 NOTE — Progress Notes (Signed)
Patient was able to void in the urinal.

## 2013-02-10 NOTE — Progress Notes (Signed)
Physical Therapy Treatment Patient Details Name: Duane Castaneda MRN: [id number] DOB: [birth year] Today's Date: 02/10/2013 Time: 7829-5621 PT Time Calculation (min): 39 min  PT Assessment / Plan / Recommendation Comments on Treatment Session  Pt will be getting blood today; pt remains very lethargic.  Pt did not transfer to chair due to lethargic state and pt stating he was seeing double.  Pt will be transferred to chair after blood transfusion.  PT needed max assist to come sit to stand.  Pt would not push down on the walker he continued to lift up despite verbal cuing.    Follow Up Recommendations  SNF     Does the patient have the potential to tolerate intense rehabilitation   no  Barriers to Discharge  none      Equipment Recommendations    at next venue   Recommendations for Other Services    Frequency 7X/week   Plan Discharge plan remains appropriate    Precautions / Restrictions Restrictions Weight Bearing Restrictions: No LLE Weight Bearing: Weight bearing as tolerated   Pertinent Vitals/Pain 0/10    Mobility  Bed Mobility Supine to Sit: 4: Min assist;HOB flat Sit to Supine: 4: Min guard;HOB flat (Pt able to scoot self to [facility] while sitting prior to lying do) Transfers Transfers: Sit to Stand Sit to Stand: 2: Max assist (did this activity 5 times to improve I ) Stand Pivot Transfers:  (Pt not transferrd due to lethargic state; pt seeing double) Ambulation/Gait Ambulation/Gait Assistance: Not tested (comment) Assistive device: Rolling walker    Exercises Total Joint Exercises Ankle Circles/Pumps: AAROM;10 reps Quad Sets: AROM Gluteal Sets: 10 reps Short Arc Quad: AAROM;10 reps Heel Slides: AAROM;10 reps Hip ABduction/ADduction: AROM Straight Leg Raises: 10 reps Goniometric ROM: 5-60 active to 65 PROM   PT Diagnosis:    PT Problem List:   PT Treatment Interventions:     PT Goals Acute Rehab PT Goals PT Goal Formulation: With patient Time For Goal  Achievement: 02/15/13 Potential to Achieve Goals: Fair PT Goal: Supine/Side to Sit - Progress: Progressing toward goal PT Goal: Sit to Supine/Side - Progress: Met PT Goal: Sit to Stand - Progress: Progressing toward goal (each attempt at sit to stand was easier) Pt will go Stand to Sit: with min assist;with upper extremity assist PT Goal: Stand to Sit - Progress: Progressing toward goal PT Transfer Goal: Bed to Chair/Chair to Bed - Progress: Not progressing PT Goal: Ambulate - Progress: Not progressing PT Goal: Up/Down Stairs - Progress: Not progressing PT Goal: Perform Home Exercise Program - Progress: Progressing toward goal  Visit Information  Last PT Received On: 02/10/13 Assistance Needed: +1    Subjective Data  Subjective: Pt states no pain   Cognition  Cognition Overall Cognitive Status: Appears within functional limits for tasks assessed/performed Area of Impairment: Attention Arousal/Alertness: Lethargic Orientation Level: Appears intact for tasks assessed Behavior During Session: [facility] for tasks performed Following Commands: Follows one step commands consistently       End of Session PT - End of Session Equipment Utilized During Treatment: Gait belt Activity Tolerance: Patient limited by fatigue Patient left: in bed   GP     RUSSELL,CINDY 02/10/2013, 9:54 AM

## 2013-02-10 NOTE — Progress Notes (Signed)
Physical Therapy Treatment Patient Details Name: Duane Castaneda MRN: [id number] DOB: 1923-04-30 Today's Date: 02/10/2013 Time: 2841-3244 PT Time Calculation (min): 30 min  PT Assessment / Plan / Recommendation Comments on Treatment Session  Pt mobility is better this PM but still having difficulty with balance when standing.      Follow Up Recommendations  SNF     Does the patient have the potential to tolerate intense rehabilitation   no     Equipment Recommendations  Rolling walker with 5" wheels                  Pertinent Vitals/Pain 5/10    Mobility  Bed Mobility Supine to Sit: 3: Mod assist Sit to Supine: 3: Mod assist Scooting to HOB: 4: Min assist Transfers Transfers: Sit to Stand Sit to Stand: 2: Max assist Stand to Sit: 3: Mod assist;4: Min assist Details for Transfer Assistance: STS repeated 6x for instruction and activity tolerance.  Ambulation/Gait Ambulation/Gait Assistance: Not tested (comment)    Exercises Total Joint Exercises Ankle Circles/Pumps: AROM;10 reps Quad Sets: AROM;10 reps Short Arc Quad: AAROM;10 reps Heel Slides: AAROM;10 reps Hip ABduction/ADduction: AAROM;10 reps   PT Diagnosis:   difficulty walking PT Problem List:  stiffness, decreased strength PT Treatment Interventions:   ex, there activityl  PT Goals  progressing  Visit Information  Last PT Received On: 02/10/13 Assistance Needed: +1    Subjective Data   I'm still so sleepy   Cognition    wnl   Balance   decreased  End of Session PT - End of Session Activity Tolerance: Patient limited by fatigue Patient left: in bed;in CPM CPM Left Knee CPM Left Knee: On CPM Right Knee Right Knee Flexion (Degrees): 75 Right Knee Extension (Degrees): 0   GP     RUSSELL,CINDY 02/10/2013, 5:29 PM

## 2013-02-10 NOTE — Clinical Social Work Placement (Signed)
Clinical Social Work Department CLINICAL SOCIAL WORK PLACEMENT NOTE 02/10/2013  Patient:  Duane Castaneda, Duane Castaneda  Account Number:  [id number] Admit date:  02/08/2013  Clinical Social Worker:  Derenda Fennel, LCSW  Date/time:  02/09/2013 11:45 AM  Clinical Social Work is seeking post-discharge placement for this patient at the following level of care:   SKILLED NURSING   (*CSW will update this form in Epic as items are completed)   02/09/2013  Patient/family provided with [facility] Department of Clinical Social Work's list of facilities offering this level of care within the geographic area requested by the patient (or if unable, by the patient's family).  02/09/2013  Patient/family informed of their freedom to choose among providers that offer the needed level of care, that participate in Medicare, Medicaid or managed care program needed by the patient, have an available bed and are willing to accept the patient.  02/09/2013  Patient/family informed of [facility]' ownership interest in [facility], as well as of the fact that they are under no obligation to receive care at this facility.  PASARR submitted to EDS on 02/09/2013 PASARR number received from EDS on 02/09/2013  FL2 transmitted to all facilities in geographic area requested by pt/family on  02/09/2013 FL2 transmitted to all facilities within larger geographic area on   Patient informed that his/her managed care company has contracts with or will negotiate with  certain facilities, including the following:     Patient/family informed of bed offers received:  02/10/2013 Patient chooses bed at [facility] Physician recommends and patient chooses bed at  [facility]  Patient to be transferred to  on   Patient to be transferred to facility by   The following physician request were entered in Epic:   Additional Comments:  Derenda Fennel, LCSW [phone]

## 2013-02-10 NOTE — Progress Notes (Signed)
Subjective: 2 Days Post-Op Procedure(s) (LRB): TOTAL KNEE ARTHROPLASTY (Right) Patient reports pain as intermittently sedate and orthostaic .    Objective: Vital signs in last 24 hours: Temp:  [97.6 F (36.4 C)-98.5 F (36.9 C)] 97.6 F (36.4 C) (03/05 0621) Pulse Rate:  [71-84] 84 (03/05 0621) Resp:  [14-18] 17 (03/05 0621) BP: (114-131)/(43-70) 131/70 mmHg (03/05 0621) SpO2:  [94 %-100 %] 99 % (03/05 0621)  Intake/Output from previous day: 03/04 0701 - 03/05 0700 In: 4035 [P.O.:480; I.V.:3555] Out: 800 [Urine:700; Drains:100] Intake/Output this shift:     Recent Labs  02/09/13 0445 02/10/13 0437  HGB 8.8* 8.2*    Recent Labs  02/09/13 0445 02/10/13 0437  WBC 9.4 10.1  RBC 2.96* 2.76*  HCT 26.5* 24.6*  PLT 108* 104*    Recent Labs  02/09/13 0445  NA 139  K 4.0  CL 103  CO2 27  BUN 31*  CREATININE 1.71*  GLUCOSE 138*  CALCIUM 8.1*   No results found for this basename: LABPT, INR,  in the last 72 hours  Neurologically intact Neurovascular intact Sensation intact distally Intact pulses distally Incision: scant drainage  Assessment/Plan: 2 Days Post-Op Procedure(s) (LRB): TOTAL KNEE ARTHROPLASTY (Right) D/C IV fluids Transfuse  Decrease bp meds  Iron  Acute blood loss anemia  [name] 02/10/2013, 7:15 AM

## 2013-02-10 NOTE — Progress Notes (Signed)
Patient does not feel the urge to void. Bladder scan showed 56ml. Will repeat scan at 0300.

## 2013-02-11 ENCOUNTER — Inpatient Hospital Stay
Admission: RE | Admit: 2013-02-11 | Discharge: 2013-03-24 | Disposition: A | Discharge status: Home / Self Care | Payer: Self-pay | Source: Ambulatory Visit | Attending: Internal Medicine | Admitting: Internal Medicine

## 2013-02-11 DIAGNOSIS — M7989 Other specified soft tissue disorders: Secondary | ICD-10-CM

## 2013-02-11 DIAGNOSIS — Z452 Encounter for adjustment and management of vascular access device: Secondary | ICD-10-CM | POA: Diagnosis not present

## 2013-02-11 DIAGNOSIS — R262 Difficulty in walking, not elsewhere classified: Secondary | ICD-10-CM | POA: Diagnosis not present

## 2013-02-11 DIAGNOSIS — M79604 Pain in right leg: Secondary | ICD-10-CM

## 2013-02-11 DIAGNOSIS — M7730 Calcaneal spur, unspecified foot: Secondary | ICD-10-CM | POA: Diagnosis not present

## 2013-02-11 DIAGNOSIS — Z471 Aftercare following joint replacement surgery: Secondary | ICD-10-CM | POA: Diagnosis not present

## 2013-02-11 DIAGNOSIS — C8589 Other specified types of non-Hodgkin lymphoma, extranodal and solid organ sites: Secondary | ICD-10-CM | POA: Diagnosis not present

## 2013-02-11 DIAGNOSIS — M109 Gout, unspecified: Secondary | ICD-10-CM | POA: Diagnosis not present

## 2013-02-11 DIAGNOSIS — R52 Pain, unspecified: Principal | ICD-10-CM

## 2013-02-11 DIAGNOSIS — I10 Essential (primary) hypertension: Secondary | ICD-10-CM | POA: Diagnosis not present

## 2013-02-11 DIAGNOSIS — M25473 Effusion, unspecified ankle: Secondary | ICD-10-CM | POA: Diagnosis not present

## 2013-02-11 DIAGNOSIS — M25579 Pain in unspecified ankle and joints of unspecified foot: Secondary | ICD-10-CM | POA: Diagnosis not present

## 2013-02-11 DIAGNOSIS — Z96659 Presence of unspecified artificial knee joint: Secondary | ICD-10-CM | POA: Diagnosis not present

## 2013-02-11 DIAGNOSIS — M1990 Unspecified osteoarthritis, unspecified site: Secondary | ICD-10-CM | POA: Diagnosis not present

## 2013-02-11 DIAGNOSIS — E039 Hypothyroidism, unspecified: Secondary | ICD-10-CM | POA: Diagnosis not present

## 2013-02-11 DIAGNOSIS — IMO0002 Reserved for concepts with insufficient information to code with codable children: Secondary | ICD-10-CM | POA: Diagnosis not present

## 2013-02-11 DIAGNOSIS — M79609 Pain in unspecified limb: Secondary | ICD-10-CM | POA: Diagnosis not present

## 2013-02-11 DIAGNOSIS — R279 Unspecified lack of coordination: Secondary | ICD-10-CM | POA: Diagnosis not present

## 2013-02-11 DIAGNOSIS — R41841 Cognitive communication deficit: Secondary | ICD-10-CM | POA: Diagnosis not present

## 2013-02-11 DIAGNOSIS — M810 Age-related osteoporosis without current pathological fracture: Secondary | ICD-10-CM | POA: Diagnosis not present

## 2013-02-11 DIAGNOSIS — M19079 Primary osteoarthritis, unspecified ankle and foot: Secondary | ICD-10-CM | POA: Diagnosis not present

## 2013-02-11 DIAGNOSIS — R609 Edema, unspecified: Secondary | ICD-10-CM | POA: Diagnosis not present

## 2013-02-11 DIAGNOSIS — M1710 Unilateral primary osteoarthritis, unspecified knee: Secondary | ICD-10-CM | POA: Diagnosis not present

## 2013-02-11 DIAGNOSIS — M6281 Muscle weakness (generalized): Secondary | ICD-10-CM | POA: Diagnosis not present

## 2013-02-11 LAB — CBC
HCT: 27.6 % — ABNORMAL LOW (ref 39.0–52.0)
Hemoglobin: 9.6 g/dL — ABNORMAL LOW (ref 13.0–17.0)
WBC: 10.4 10*3/uL (ref 4.0–10.5)

## 2013-02-11 LAB — TYPE AND SCREEN

## 2013-02-11 MED ORDER — BISACODYL 10 MG RE SUPP: 10.0000 mg | Freq: Every day | RECTAL | Status: DC | PRN

## 2013-02-11 MED ORDER — NALOXONE HCL 0.4 MG/ML IJ SOLN
0.2000 mg | Freq: Once | INTRAMUSCULAR | Status: AC
  Administered 2013-02-11: 0.2 mg via INTRAVENOUS
  Filled 2013-02-11: qty 1

## 2013-02-11 MED ORDER — FERROUS SULFATE 325 (65 FE) MG PO TABS: 325.0000 mg | ORAL_TABLET | Freq: Three times a day (TID) | ORAL | Status: DC

## 2013-02-11 MED ORDER — ASPIRIN 325 MG PO TBEC: 325.0000 mg | DELAYED_RELEASE_TABLET | Freq: Every day | ORAL | Status: DC

## 2013-02-11 MED ORDER — TRAMADOL HCL 50 MG PO TABS: 50.0000 mg | ORAL_TABLET | Freq: Four times a day (QID) | ORAL | Status: DC | PRN

## 2013-02-11 NOTE — Clinical Social Work Note (Signed)
Pt d/c today to [facility]. Pt, pt's daughter, and facility aware and agreeable. Pt to transfer with RN. D/C summary faxed. Daughter to complete admission paperwork at facility.  Derenda Fennel, [place] [phone]

## 2013-02-11 NOTE — Progress Notes (Signed)
Patient sent to [facility].  Report called and given to [facility].  IV removed - WNL.  DDI, TEDs in place.  Stable for discharge. Packet sent with patient

## 2013-02-11 NOTE — Clinical Social Work Placement (Signed)
Clinical Social Work Department CLINICAL SOCIAL WORK PLACEMENT NOTE 02/11/2013  Patient:  Duane Castaneda, Duane Castaneda  Account Number:  [id number] Admit date:  02/08/2013  Clinical Social Worker:  Derenda Fennel, LCSW  Date/time:  02/09/2013 11:45 AM  Clinical Social Work is seeking post-discharge placement for this patient at the following level of care:   SKILLED NURSING   (*CSW will update this form in Epic as items are completed)   02/09/2013  Patient/family provided with [facility] Department of Clinical Social Work's list of facilities offering this level of care within the geographic area requested by the patient (or if unable, by the patient's family).  02/09/2013  Patient/family informed of their freedom to choose among providers that offer the needed level of care, that participate in Medicare, Medicaid or managed care program needed by the patient, have an available bed and are willing to accept the patient.  02/09/2013  Patient/family informed of [facility]' ownership interest in [facility], as well as of the fact that they are under no obligation to receive care at this facility.  PASARR submitted to EDS on 02/09/2013 PASARR number received from EDS on 02/09/2013  FL2 transmitted to all facilities in geographic area requested by pt/family on  02/09/2013 FL2 transmitted to all facilities within larger geographic area on   Patient informed that his/her managed care company has contracts with or will negotiate with  certain facilities, including the following:     Patient/family informed of bed offers received:  02/10/2013 Patient chooses bed at [facility] Physician recommends and patient chooses bed at  [facility]  Patient to be transferred to [facility] on  02/11/2013 Patient to be transferred to facility by RN  The following physician request were entered in Epic:   Additional Comments:  Derenda Fennel, LCSW [phone]

## 2013-02-11 NOTE — Discharge Summary (Signed)
Physician Discharge Summary  Patient ID: Duane Castaneda MRN: [id number] DOB/AGE: [birth year] 79 y.o.  Admit date: 02/08/2013 Discharge date: 02/11/2013  Admission Diagnoses: OA RIGHT KNEE   Discharge Diagnoses: OA RIGHT KNEE WITH ACUTE BLOOD LOSS ANEMIA  Active Problems:   * No active hospital problems. *   Discharged Condition: good  Hospital Course:  The patient was admitted for an uncomplicated right total knee arthroplasty which did well. He did have acute blood loss anemia with a hemoglobin of 8 he was transfused 2 units 2 hemoglobin of 9.6. Postoperatively he was managed with tramadol and this still made him somewhat somnolent. But he was easily arousable, he was able to participate in physical therapy. Apparently had some balance issues. He discharges with a passive range of motion is 65.  Consults: None  Significant Diagnostic Studies: labs:  Hemoglobin & Hematocrit     Component Value Date/Time   HGB 9.6* 02/11/2013 0443   HCT 27.6* 02/11/2013 0443     Treatments: surgery: right tka and 2units PRBC's   Discharge Exam: Blood pressure 132/40, pulse 86, temperature 97.5 F (36.4 C), temperature source Oral, resp. rate 16, height 5\' 10"  (1.778 m), weight 210 lb 12.2 oz (95.6 kg), SpO2 100.00%. It seems in the mornings he is able to wake up easily. He is awake alert oriented to person place. His incision is clean dry and intact he has no signs of DVT with a negative Homans sign and a soft catheter  Disposition:   Discharge Orders   Future Appointments [facility] Department Dept Phone   02/22/2013 2:00 PM Vickki Hearing, MD [facility] [phone]   03/19/2013 2:30 PM [facility] Chair 7 [facility] [phone]   06/14/2013 2:00 PM Randall An, MD [facility] [phone]   Future Orders Complete By Expires     Call MD / Call 911  As directed     Comments:      If you experience chest pain or shortness of breath,  CALL 911 and be transported to the hospital emergency room.  If you develope a fever above 101 F, pus (white drainage) or increased drainage or redness at the wound, or calf pain, call your surgeon's office.    Constipation Prevention  As directed     Comments:      Drink plenty of fluids.  Prune juice may be helpful.  You may use a stool softener, such as Colace (over the counter) 100 mg twice a day.  Use MiraLax (over the counter) for constipation as needed.    Diet - low sodium heart healthy  As directed     Discharge instructions  As directed     Comments:      No lifting x 12 weeks No driving 12 weeks No bath x 2 weeks  DVT prevention TED HOSE AND ECOTRIN 325 MG DAILY X 6 WEEKS  STAPLES OUT POD 12  F/U POD 14  CPM 0-65 INCREASE 10 DEGREES PER DAY AS TOLERATED [X 3 WEEKS], USE 6 HRS PER DAY  WBAT    Do not put a pillow under the knee. Place it under the heel.  As directed     Increase activity slowly as tolerated  As directed         Medication List    STOP taking these medications       aspirin 81 MG tablet     multivitamin-iron-minerals-folic acid chewable tablet     naproxen  sodium 220 MG tablet  Commonly known as:  ANAPROX     OSTEO BI-FLEX ADV DOUBLE ST PO     OSTEO BI-FLEX REGULAR STRENGTH PO      TAKE these medications       allopurinol 300 MG tablet  Commonly known as:  ZYLOPRIM  Take 300 mg by mouth daily.     aspirin 325 MG EC tablet  Take 1 tablet (325 mg total) by mouth daily with breakfast.     bisacodyl 10 MG suppository  Commonly known as:  DULCOLAX  Place 1 suppository (10 mg total) rectally daily as needed.     bisoprolol-hydrochlorothiazide 2.5-6.25 MG per tablet  Commonly known as:  ZIAC  Take 1 tablet by mouth daily.     ferrous sulfate 325 (65 FE) MG tablet  Take 1 tablet (325 mg total) by mouth 3 (three) times daily with meals.     levothyroxine 150 MCG tablet  Commonly known as:  SYNTHROID, LEVOTHROID  Take 150 mcg by mouth daily.      lisinopril 10 MG tablet  Commonly known as:  PRINIVIL,ZESTRIL  Take 10 mg by mouth daily.     multivitamin capsule  Take 1 capsule by mouth daily.     omeprazole 20 MG capsule  Commonly known as:  PRILOSEC  Take 20 mg by mouth daily.     terazosin 10 MG capsule  Commonly known as:  HYTRIN  Take 10 mg by mouth at bedtime.     traMADol 50 MG tablet  Commonly known as:  ULTRAM  Take 1 tablet (50 mg total) by mouth every 6 (six) hours as needed.           Follow-up Information   Follow up with [name], MD On 02/22/2013.   Contact information:   [address], [address] [place] [address] [phone]       Signed: [name] 02/11/2013, 7:38 AM

## 2013-02-12 LAB — GLUCOSE, CAPILLARY

## 2013-02-15 ENCOUNTER — Encounter (HOSPITAL_COMMUNITY): Payer: Self-pay | Admitting: Orthopedic Surgery

## 2013-02-22 ENCOUNTER — Ambulatory Visit: Payer: Medicare Other | Admitting: Orthopedic Surgery

## 2013-02-23 ENCOUNTER — Ambulatory Visit (INDEPENDENT_AMBULATORY_CARE_PROVIDER_SITE_OTHER): Payer: Medicare Other | Admitting: Orthopedic Surgery

## 2013-02-23 VITALS — BP 112/54 | Ht 69.0 in | Wt 200.0 lb

## 2013-02-23 DIAGNOSIS — Z96659 Presence of unspecified artificial knee joint: Secondary | ICD-10-CM

## 2013-02-23 DIAGNOSIS — Z96651 Presence of right artificial knee joint: Secondary | ICD-10-CM

## 2013-02-23 NOTE — Patient Instructions (Addendum)
Continue therapy

## 2013-02-23 NOTE — Progress Notes (Signed)
Patient ID: Duane Castaneda, male   DOB: 07-09-1923, 79 y.o.   MRN: [id number] Chief Complaint  Patient presents with  . Follow-up    Post op 1 Righr TKA DOS 02/08/13    BP 112/54  Ht 5\' 9"  (1.753 m)  Wt 200 lb (90.719 kg)  BMI 29.52 kg/m2  Postop right total knee doing well patient is at the [facility]. He is ambulatory with assistance standby assist. He is passive extension -5 flexion approximately 85.  His pain is controlled his wound is clean is minimal swelling in his leg and knee.  He is progressing well followup in 4 weeks.

## 2013-02-27 ENCOUNTER — Non-Acute Institutional Stay (SKILLED_NURSING_FACILITY): Payer: Medicare Other | Admitting: Internal Medicine

## 2013-02-27 DIAGNOSIS — E039 Hypothyroidism, unspecified: Secondary | ICD-10-CM | POA: Diagnosis not present

## 2013-02-27 DIAGNOSIS — Z96651 Presence of right artificial knee joint: Secondary | ICD-10-CM

## 2013-02-27 DIAGNOSIS — Z96659 Presence of unspecified artificial knee joint: Secondary | ICD-10-CM | POA: Diagnosis not present

## 2013-02-27 DIAGNOSIS — I10 Essential (primary) hypertension: Secondary | ICD-10-CM | POA: Diagnosis not present

## 2013-02-27 DIAGNOSIS — M109 Gout, unspecified: Secondary | ICD-10-CM | POA: Diagnosis not present

## 2013-03-09 ENCOUNTER — Ambulatory Visit (HOSPITAL_COMMUNITY): Payer: Medicare Other | Attending: Internal Medicine

## 2013-03-09 DIAGNOSIS — M25473 Effusion, unspecified ankle: Secondary | ICD-10-CM | POA: Insufficient documentation

## 2013-03-09 DIAGNOSIS — M25579 Pain in unspecified ankle and joints of unspecified foot: Secondary | ICD-10-CM | POA: Insufficient documentation

## 2013-03-09 DIAGNOSIS — M25476 Effusion, unspecified foot: Secondary | ICD-10-CM | POA: Insufficient documentation

## 2013-03-09 DIAGNOSIS — M7730 Calcaneal spur, unspecified foot: Secondary | ICD-10-CM | POA: Insufficient documentation

## 2013-03-09 DIAGNOSIS — M19079 Primary osteoarthritis, unspecified ankle and foot: Secondary | ICD-10-CM | POA: Diagnosis not present

## 2013-03-10 ENCOUNTER — Non-Acute Institutional Stay (SKILLED_NURSING_FACILITY): Payer: Medicare Other | Admitting: Internal Medicine

## 2013-03-10 ENCOUNTER — Ambulatory Visit (HOSPITAL_COMMUNITY)
Admit: 2013-03-10 | Discharge: 2013-03-10 | Disposition: A | Discharge status: Home / Self Care | Payer: Medicare Other | Attending: Internal Medicine | Admitting: Internal Medicine

## 2013-03-10 DIAGNOSIS — M79609 Pain in unspecified limb: Secondary | ICD-10-CM | POA: Diagnosis not present

## 2013-03-10 DIAGNOSIS — M7989 Other specified soft tissue disorders: Secondary | ICD-10-CM | POA: Diagnosis not present

## 2013-03-10 DIAGNOSIS — R609 Edema, unspecified: Secondary | ICD-10-CM

## 2013-03-11 NOTE — Progress Notes (Shared)
Patient ID: Duane Castaneda, male   DOB: 1922/12/19, 79 y.o.   MRN: [id number]          HISTORY & PHYSICAL  DATE:   02/26/2013  FACILITY:  [facility]   LEVEL OF CARE:  SNF   CHIEF COMPLAINT:  Status post admission to [facility], 02/08/2013 through 02/11/2013.   HISTORY OF PRESENT ILLNESS:  This man is a reasonably independent 79, year-old, living at home in [place] with his daughter.  He had disabling osteoarthritis in the right knee and was admitted electively for a right total knee arthroplasty.  He tolerated the surgery well.    He required 2 U of transfusion when his hemoglobin dropped to 8.  His discharge hemoglobin was 9.6 and this has been repeated at 9.4 on 02/12/2013.      He has been here for rehabilitation for the past two weeks.  In my absence, he seems to have done well and really is doing very well in rehabilitation, using a walker.    PAST MEDICAL HISTORY/PROBLEM LIST:  Osteoarthritis of the right knee, now status post right total knee.  Prior left total knee arthroplasty.    Hypertension.    Remote history of gout.  However, he has been on allopurinol for many years.    Postop surgical blood loss, on iron.    Status post thyroidectomy, on Synthroid replacement.    He tells me he has a remote history of colon cancer, status post resection.    He tells me that eight years ago he had non-Hodgkin's lymphoma, although I do not have any current information on this, followed by Dr. Mariel Sleet.    SOCIAL HISTORY: HOUSING:  As mentioned, he lives with his daughter.   FUNCTIONAL STATUS:  He was using a walker prior to admission.   REVIEW OF SYSTEMS:   CHEST/RESPIRATORY:  No shortness of breath.   CARDIAC:   No chest pain.   GI: No nausea or vomiting.   MUSCULOSKELETAL:  Still does not have full range of flexion or extension of the right knee, although he is doing well.    PHYSICAL EXAMINATION:   GENERAL APPEARANCE:  The patient is not in any  distress.   CHEST/RESPIRATORY:  Breathing is even and unlabored.  Breath sounds are clear to auscultation bilaterally.  Chest is nontender and normal in contour.   CARDIOVASCULAR:  CARDIAC:   Rhythm regular.  No murmur.  No extra heart sounds.  Note a Port-A-Cath.   GASTROINTESTINAL:  ABDOMEN:  No tenderness.  No masses.   MUSCULOSKELETAL:   EXTREMITIES:   RIGHT LOWER EXTREMITY:  Right knee incision looks fine.  Does have some swelling here, but certainly no evidence of infection.  No evidence of a DVT in either leg.    ASSESSMENT/PLAN:  Status post right total knee arthroplasty.  He is doing well here.   Hypertension.  We will monitor.  On lisinopril.   Hypothyroidism.  Status post thyroidectomy.  His TSH is within the normal range.    History of gout.  On allopurinol.  He  apparently has not had a problem in many years.    BPH.  On terazosin.    I will update his lab work, especially his hemoglobin.  Otherwise, he appears to be stable.    He was not sent out on DVT prophylaxis other than aspirin.  Nevertheless, there seems to be no problem here and no reason to start this now.    CPT CODE:  99305.   °

## 2013-03-19 ENCOUNTER — Encounter (HOSPITAL_COMMUNITY): Payer: Medicare Other | Attending: Oncology

## 2013-03-19 DIAGNOSIS — Z452 Encounter for adjustment and management of vascular access device: Secondary | ICD-10-CM

## 2013-03-19 DIAGNOSIS — Z95828 Presence of other vascular implants and grafts: Secondary | ICD-10-CM

## 2013-03-19 DIAGNOSIS — Z9889 Other specified postprocedural states: Secondary | ICD-10-CM | POA: Insufficient documentation

## 2013-03-19 DIAGNOSIS — C8589 Other specified types of non-Hodgkin lymphoma, extranodal and solid organ sites: Secondary | ICD-10-CM

## 2013-03-19 MED ORDER — SODIUM CHLORIDE 0.9 % IJ SOLN
10.0000 mL | INTRAMUSCULAR | Status: DC | PRN
  Administered 2013-03-19: 10 mL via INTRAVENOUS
  Filled 2013-03-19: qty 10

## 2013-03-19 MED ORDER — HEPARIN SOD (PORK) LOCK FLUSH 100 UNIT/ML IV SOLN
500.0000 [IU] | Freq: Once | INTRAVENOUS | Status: AC
  Administered 2013-03-19: 500 [IU] via INTRAVENOUS
  Filled 2013-03-19: qty 5

## 2013-03-19 MED ORDER — HEPARIN SOD (PORK) LOCK FLUSH 100 UNIT/ML IV SOLN
INTRAVENOUS | Status: AC
  Filled 2013-03-19: qty 5

## 2013-03-19 NOTE — Progress Notes (Signed)
Duane Castaneda presented for Portacath access and flush. Proper placement of portacath confirmed by CXR. Portacath located left chest wall accessed with  H 20 needle. No blood return but flushes easily. Portacath flushed with 20ml NS and 500U/90ml Heparin and needle removed intact. Procedure without incident. Patient tolerated procedure well.

## 2013-03-22 NOTE — Progress Notes (Shared)
Patient ID: Duane Castaneda, male   DOB: 1923-11-15, 79 y.o.   MRN: [id number]          PROGRESS NOTE  DATE:   03/10/2013   FACILITY:  [facility]   LEVEL OF CARE:   SNF   Acute Visit   CHIEF COMPLAINT:  Review of right leg swelling, edema.    HISTORY OF PRESENT ILLNESS:  This is a patient who came to [place] after undergoing an uncomplicated elective right total knee arthroplasty.  He did have postoperative anemia and received 2 U.    Staff have noticed increasing edema and erythema of the right leg and asked that we look at this.  In terms of DVT prophylaxis, he was sent out only on aspirin.  I am not quite sure how I overlooked this.  In any case, the swelling is somewhat tender.    REVIEW OF SYSTEMS:   CHEST/RESPIRATORY:  No cough.  No shortness of breath.   CARDIAC:   No clear chest pain.   MUSCULOSKELETAL:  Right leg:  No trauma.  No fever is noted.    PHYSICAL EXAMINATION:   CHEST/RESPIRATORY:  Clear air entry bilaterally.   CARDIOVASCULAR:   CARDIAC:   Heart sounds are normal.  There is no elevation of his jugular venous pressure.  No accentuated P2.   MUSCULOSKELETAL:   EXTREMITIES:   RIGHT LOWER EXTREMITY:  His right leg is uniformly swollen and somewhat erythematous to just above the knee.  There is some calf tenderness here.    ASSESSMENT/PLAN:  Rule out DVT, right leg.  I will keep him off this until we know the result of this.     CPT CODE: 08657.

## 2013-03-24 ENCOUNTER — Non-Acute Institutional Stay (SKILLED_NURSING_FACILITY): Payer: Medicare Other | Admitting: Internal Medicine

## 2013-03-24 DIAGNOSIS — Z96659 Presence of unspecified artificial knee joint: Secondary | ICD-10-CM

## 2013-03-24 DIAGNOSIS — I10 Essential (primary) hypertension: Secondary | ICD-10-CM

## 2013-03-24 DIAGNOSIS — Z96651 Presence of right artificial knee joint: Secondary | ICD-10-CM

## 2013-03-24 NOTE — Progress Notes (Signed)
Patient ID: Duane Castaneda, male   DOB: 1922-12-31, 79 y.o.   MRN: [id number] Chief complaint; predischarge review this is a patient who has been in the facility for her roughly 5 weeks now.  He had a right total knee arthroplasty.  He came here for rehabilitation he has done well.  Last hemoglobin in the facility was 9.1 on 03/01/2013.  Besides all this he has done very well low.  He is walking with a walker.  He is going home with a caregiver who is his daughter.  He already has a walker at home.  He appears to have some degree of renal insufficiency with a BUN of 30 and a creatinine of 1.53 on March 7 although this was down from his hospital values should probably be repeated.    Physical exam; I didn't get up and go test on him and he does very well with a walker.  I see no issues here with this discharge.  Impression/plan #1 status post right total knee replacement very stable #2 renal insufficiency this will need to be repeated he sees his primary physician in 2 weeks

## 2013-03-26 ENCOUNTER — Ambulatory Visit (HOSPITAL_COMMUNITY)
Admit: 2013-03-26 | Discharge: 2013-03-26 | Disposition: A | Discharge status: Home / Self Care | Payer: Medicare Other | Source: Ambulatory Visit | Attending: Internal Medicine | Admitting: Internal Medicine

## 2013-03-26 DIAGNOSIS — IMO0001 Reserved for inherently not codable concepts without codable children: Secondary | ICD-10-CM | POA: Insufficient documentation

## 2013-03-26 DIAGNOSIS — I10 Essential (primary) hypertension: Secondary | ICD-10-CM | POA: Diagnosis not present

## 2013-03-26 DIAGNOSIS — M6281 Muscle weakness (generalized): Secondary | ICD-10-CM | POA: Insufficient documentation

## 2013-03-26 DIAGNOSIS — R269 Unspecified abnormalities of gait and mobility: Secondary | ICD-10-CM | POA: Insufficient documentation

## 2013-03-26 NOTE — Evaluation (Signed)
Physical Therapy Evaluation  Patient Details  Name: Duane Castaneda MRN: [id number] Date of Birth: 1923/03/28  Today's Date: 03/26/2013 Time: 5638-7564 PT Time Calculation (min): 42 min Charges: 1 eval, 10' TE             Visit#: 1 of 8  Re-eval: 04/25/13 Assessment Diagnosis: Rt TKR Surgical Date: 02/08/13 Next MD Visit: Dr. Alto Denver - 4/22 Prior Therapy: SN[facility]  Authorization: Medicare    Authorization Time Period:    Authorization Visit#: 1 of 10   Past Medical History:  Past Medical History  Diagnosis Date  . History of colon cancer     Status post resection in 1993  . Essential hypertension, benign   . Degenerative joint disease   . Port catheter in place 08/21/2012  . Hypothyroidism   . Cataracts, bilateral   . Detached retina   . GERD (gastroesophageal reflux disease)   . Lymphoma     Diffuse large B cell status post R [facility] - Dr. Mariel Sleet, Remission since Dec 2007  . Follicular thyroid carcinoma     Dr. Gerrit Friends   Past Surgical History:  Past Surgical History  Procedure Laterality Date  . Detached retina left eye  1983    [facility]  . Bilateral cataract surgery  1988 Left, 1990 Right    [facility]  . Colon cancer resection  1993    Dr. Ulice Bold  . Lymphoma surgery  2007    Dr. Tally Joe  . Thyroid cancer surgery  2009    Dr. Danie Binder  . Transurethral resection of prostate  2011    Dr. Karl Bales  . Left knee replacement  2004    Dr. Arletha Grippe  . Hernia repair Bilateral 2000-Left, 2005-Right    Dr. Ulice Bold  . Total knee arthroplasty Right 02/08/2013    Procedure: TOTAL KNEE ARTHROPLASTY;  Surgeon: Vickki Hearing, MD;  Location: [facility];  Service: Orthopedics;  Laterality: Right;    Subjective Symptoms/Limitations Pertinent History: Pt is referred to PT s/p Rt TKA on 02/08/13 was d/c to the [facility] until 03/24/13.  He is currently ambulating short distances with his SPC.  C/o is swelling, pain with manual flexion, difficulty  walking independently, difficulty with balance.  How long can you sit comfortably?: no difficulty How long can you stand comfortably?: 15 minutes How long can you walk comfortably?: 5 minutes with SPC Patient Stated Goals: walk independently Pain Assessment Currently in Pain?: No/denies Pain Score:  (Pain range; 0-4) Pain Location: Knee Pain Orientation: Right Pain Type: Acute pain;Surgical pain Pain Onset: More than a month ago Pain Relieving Factors: ice and pain medication (PRN) Effect of Pain on Daily Activities: difficulty walking independently  Balance Screening Balance Screen Has the patient fallen in the past 6 months: No Has the patient had a decrease in activity level because of a fear of falling? : No Is the patient reluctant to leave their home because of a fear of falling? : No   Cognition/Observation Observation/Other Assessments Observations: significant pitting edema to RLE  Sensation/Coordination/Flexibility/Functional Tests Functional Tests Functional Tests: 30 sec sit to stand w/o UE assist: 0x complete   Assessment RLE AROM (degrees) Right Knee Extension: 5 Right Knee Flexion: 110 RLE PROM (degrees) Right Knee Extension: 2 Right Knee Flexion: 112 RLE Strength Right Hip Flexion: 4/5 Right Hip Extension: 2+/5 (taken in sidelying) Right Hip ABduction: 3+/5 Right Knee Flexion: 5/5 Right Knee Extension: 5/5 Palpation Palpation: pain and tenderness to Rt knee  Mobility/Balance  Ambulation/Gait  Ambulation/Gait: Yes Assistive device: Straight cane Gait Pattern: Decreased dorsiflexion - right;Antalgic;Lateral hip instability;Trunk flexed Static Standing Balance Single Leg Stance - Right Leg: 0 Single Leg Stance - Left Leg: 0 Tandem Stance - Right Leg: 6 Tandem Stance - Left Leg: 6 Rhomberg - Eyes Opened: 10 Rhomberg - Eyes Closed: 10   Exercise/Treatments   Standing Heel Raises: 5 reps Knee Flexion: Right;5 reps Functional Squat: 5 reps Gait  Training: with SPC in left hand, and mod I with min A. for posture and technique Supine Short Arc Quad Sets: Right;5 reps Bridges: 10 reps Straight Leg Raises: Right;5 reps  Physical Therapy Assessment and Plan PT Assessment and Plan Clinical Impression Statement: Pt is an 79 year old male referred to PT s/p Rt TKA w/ significant swelling and hx of Lt TKR in 2004 with impairments listed below.  Pt will benefit from skilled therapeutic intervention in order to improve on the following deficits: Decreased activity tolerance;Decreased balance;Difficulty walking;Decreased strength;Pain;Impaired perceived functional ability;Decreased mobility;Abnormal gait Rehab Potential: Good PT Frequency: Min 2X/week PT Duration: 8 weeks PT Treatment/Interventions: Gait training;Stair training;Functional mobility training;Therapeutic activities;Therapeutic exercise;Balance training;Neuromuscular re-education;Patient/family education;Manual techniques;Modalities PT Plan: NuStep/Bike for activity tolerance and strength, functional squats, heel/toe raises, tandem stance, knee flexion, standing TKE progress towards gait activities to improve independence with gait.     Goals Home Exercise Program Pt will Perform Home Exercise Program: Independently PT Goal: Perform Home Exercise Program - Progress: Goal set today PT Short Term Goals Time to Complete Short Term Goals: 3 weeks PT Short Term Goal 1: Pt will improve his RLE strength in order to begin ambulating indoors independently. PT Short Term Goal 2: Pt will improve his functional strength and demonstrate Sit to standing without UE assit from chair surface for greater ease with getting up from family and friends chairs.  PT Short Term Goal 3: Pt will reduce knee edema to WNL of LLE for improved muscle function.  PT Long Term Goals Time to Complete Long Term Goals:  (6 weeks) PT Long Term Goal 1: Pt will improve functional strength and ascend and descend 5 steps  with 1 handrail with reciprocal pattern to ascend and step to pattern to descend.  PT Long Term Goal 2: Pt will improve his dynamic balance and demonstrate independent ambulation on outdoor surfaces for improved QOL.   Long Term Goal 3: Pt will imporve his LEFS to 64/60 for improved QOL.  Long Term Goal 4: Pt will improve his knee AROM 0-115 degrees for improved mechanics with ambulation to decrease risk of secondary injuries.   Problem List Patient Active Problem List  Diagnosis  . OA (osteoarthritis) of knee  . Port catheter in place  . Preoperative evaluation to rule out surgical contraindication  . Essential hypertension, benign  . S/P total knee replacement    PT Plan of Care PT Home Exercise Plan: see scanned report PT Patient Instructions: importance of HEP, elevating RLE above heart to decrease swelling, answered questions about diagnosis.  Consulted and Agree with Plan of Care: Patient;Family member/caregiver Family Member Consulted: Daughter Ship broker)  GP Functional Limitation: Mobility: Walking and moving around [facility] PT THERAPY MOBILITY GOAL STATUS [id number] 03/26/2013 Matilde Haymaker, PT 1 Filed [facility] PT THERAPY MOBILITY CURRENT STATUS [id number] 03/26/2013 Matilde Haymaker, PT  Oumar Marcott, PT 03/26/2013, 4:01 PM  Physician Documentation Your signature is required to indicate approval of the treatment plan as stated above.  Please sign and either send electronically or make a copy of this report for  your files and return this physician signed original.   Please mark one 1.__approve of plan  2. ___approve of plan with the following conditions.   ______________________________                                                          _____________________ Physician Signature                                                                                                             Date

## 2013-03-30 ENCOUNTER — Encounter: Payer: Self-pay | Admitting: Orthopedic Surgery

## 2013-03-30 ENCOUNTER — Ambulatory Visit (INDEPENDENT_AMBULATORY_CARE_PROVIDER_SITE_OTHER): Payer: Medicare Other | Admitting: Orthopedic Surgery

## 2013-03-30 VITALS — BP 126/68 | Ht 69.0 in | Wt 200.0 lb

## 2013-03-30 DIAGNOSIS — Z96659 Presence of unspecified artificial knee joint: Secondary | ICD-10-CM

## 2013-03-30 DIAGNOSIS — Z96651 Presence of right artificial knee joint: Secondary | ICD-10-CM

## 2013-03-30 NOTE — Progress Notes (Signed)
Patient ID: Duane Castaneda, male   DOB: 24-Oct-1923, 79 y.o.   MRN: [id number] Chief Complaint  Patient presents with  . Follow-up    Post op 2 right TKA DOS 02/08/13    BP 126/68  Ht 5\' 9"  (1.753 m)  Wt 200 lb (90.719 kg)  BMI 29.52 kg/m2  Postoperative total knee about 7 weeks doing very well require a cane for balance no pain not taking any pain medication incision is healed. Flexion is adequate.  Continue therapy return in 6 weeks for postop visit @ 3 months

## 2013-03-31 ENCOUNTER — Ambulatory Visit (HOSPITAL_COMMUNITY)
Admission: RE | Admit: 2013-03-31 | Discharge: 2013-03-31 | Disposition: A | Discharge status: Home / Self Care | Payer: Medicare Other | Source: Ambulatory Visit | Attending: Internal Medicine | Admitting: Internal Medicine

## 2013-03-31 DIAGNOSIS — I10 Essential (primary) hypertension: Secondary | ICD-10-CM | POA: Diagnosis not present

## 2013-03-31 DIAGNOSIS — M6281 Muscle weakness (generalized): Secondary | ICD-10-CM | POA: Diagnosis not present

## 2013-03-31 DIAGNOSIS — IMO0001 Reserved for inherently not codable concepts without codable children: Secondary | ICD-10-CM | POA: Diagnosis not present

## 2013-03-31 DIAGNOSIS — R269 Unspecified abnormalities of gait and mobility: Secondary | ICD-10-CM | POA: Diagnosis not present

## 2013-03-31 NOTE — Progress Notes (Signed)
Physical Therapy Treatment Patient Details  Name: Duane Castaneda MRN: [id number] Date of Birth: June 16, 1923  Today's Date: 03/31/2013 Time: 1102-1150 PT Time Calculation (min): 48 min Visit#: 2 of 8  Re-eval: 04/25/13 Authorization: Medicare  Authorization Visit#: 2 of 10  Charges:  therex 32', massage 10'  Subjective: Symptoms/Limitations Symptoms: Pt states he is having no pain.  Reports compliance with HEP.  Daughter is taking him to the beach this weekend.   Exercise/Treatments Aerobic Stationary Bike: 8' seat 11 full revolutions Standing Heel Raises: 10 reps;Limitations Heel Raises Limitations: toeraises 10 reps individually with cues to decrease substitution Knee Flexion: 10 reps Functional Squat: 10 reps Gait Training: with SPC in left hand, and mod I with min A. for posture and technique Supine Short Arc Quad Sets: Right;10 reps Bridges: 10 reps Straight Leg Raises: Right;10 reps   Manual Therapy Manual Therapy: Massage Massage: R LE to decrease edema  Physical Therapy Assessment and Plan PT Assessment and Plan Clinical Impression Statement: Pt able to complete all exercises without difficulty other than toeraises.  Pt with extreme substituion and noted weakness in dorsiflexors.  Added massage to help decrease edema in R LE with good results.  ROM is doing well. PT Plan: Add NuStep (pt. has Bike at home) for activity tolerance and strength, tandem stance, TKE and steps.  progress towards gait activities to improve independence with gait.      Problem List Patient Active Problem List  Diagnosis  . OA (osteoarthritis) of knee  . Port catheter in place  . Preoperative evaluation to rule out surgical contraindication  . Essential hypertension, benign  . S/P total knee replacement    PT - End of Session Activity Tolerance: Patient tolerated treatment well General Behavior During Therapy: WFL for tasks assessed/performed Cognition: WFL for tasks  performed   Lurena Nida, PTA/CLT 03/31/2013, 12:04 PM

## 2013-04-06 ENCOUNTER — Ambulatory Visit (HOSPITAL_COMMUNITY)
Admission: RE | Admit: 2013-04-06 | Discharge: 2013-04-06 | Disposition: A | Discharge status: Home / Self Care | Payer: Medicare Other | Source: Ambulatory Visit | Attending: Internal Medicine | Admitting: Internal Medicine

## 2013-04-06 DIAGNOSIS — R609 Edema, unspecified: Secondary | ICD-10-CM | POA: Diagnosis not present

## 2013-04-06 DIAGNOSIS — I10 Essential (primary) hypertension: Secondary | ICD-10-CM | POA: Diagnosis not present

## 2013-04-06 DIAGNOSIS — M6281 Muscle weakness (generalized): Secondary | ICD-10-CM | POA: Diagnosis not present

## 2013-04-06 DIAGNOSIS — Z79899 Other long term (current) drug therapy: Secondary | ICD-10-CM | POA: Diagnosis not present

## 2013-04-06 DIAGNOSIS — Z6830 Body mass index (BMI) 30.0-30.9, adult: Secondary | ICD-10-CM | POA: Diagnosis not present

## 2013-04-06 DIAGNOSIS — IMO0001 Reserved for inherently not codable concepts without codable children: Secondary | ICD-10-CM | POA: Diagnosis not present

## 2013-04-06 DIAGNOSIS — R269 Unspecified abnormalities of gait and mobility: Secondary | ICD-10-CM | POA: Diagnosis not present

## 2013-04-06 DIAGNOSIS — D539 Nutritional anemia, unspecified: Secondary | ICD-10-CM | POA: Diagnosis not present

## 2013-04-06 NOTE — Progress Notes (Signed)
Physical Therapy Treatment Patient Details  Name: BOHDAN MACHO MRN: [id number] Date of Birth: 02-26-1923  Today's Date: 04/06/2013 Time: 4098-1191 PT Time Calculation (min): 48 min Charge: Therex 38', Manual 10'  Visit#: 3 of 8  Re-eval: 04/25/13    Authorization: Medicare  Authorization Time Period:    Authorization Visit#: 3 of 10   Subjective: Symptoms/Limitations Symptoms: Pt reported pain free today, continues to c/o swelling.  Went to MD earlier today and had blood taken. Pain Assessment Currently in Pain?: No/denies  Objective:   Exercise/Treatments Aerobic Stationary Bike: 8' seat 11 full revolutions Standing Heel Raises: 10 reps;Limitations Heel Raises Limitations: toeraises 10 reps individually with cues to decrease substitution Knee Flexion: Both;10 reps Lateral Step Up: Right;10 reps;Hand Hold: 2;Step Height: 4" Forward Step Up: Right;10 reps;Hand Hold: 2;Step Height: 4" Functional Squat: 10 reps Gait Training: with SPC in left hand, min A and cueing for posture and proper sequencing Other Standing Knee Exercises: tandem stance 2x 30" with  Supine Quad Sets: Right;10 reps Short Arc Quad Sets: Right;10 reps Terminal Knee Extension: AAROM;Right;10 reps Bridges: 10 reps Straight Leg Raises: Right;10 reps   Manual Therapy Manual Therapy: Edema management Edema Management: Retro massage Rt LE to decrease edema  Physical Therapy Assessment and Plan PT Assessment and Plan Clinical Impression Statement: Began stair training and balance activities.  Pt with min assistance and cueing to improve spatial awareness with tandem stance.  Pt able to follow demonstration appropriate technique with forward and lateral step up, vc-ing for technique to reduce hip hiking.  AAROM with supine TKE for proper technquie required. PT Plan: Add NuStep (pt. has Bike at home) for activity tolerance and strength.  Add standing TKE next session.  Progress towards gait activities to  improve independence with gait.     Goals    Problem List Patient Active Problem List   Diagnosis Date Noted  . S/P total knee replacement 02/23/2013  . Preoperative evaluation to rule out surgical contraindication 12/30/2012  . Essential hypertension, benign 12/30/2012  . Port catheter in place 08/21/2012  . OA (osteoarthritis) of knee 09/03/2011    PT - End of Session Activity Tolerance: Patient tolerated treatment well General Behavior During Therapy: [facility] for tasks assessed/performed Cognition: WFL for tasks performed  GP    [name] 04/06/2013, 4:03 PM

## 2013-04-08 ENCOUNTER — Ambulatory Visit (HOSPITAL_COMMUNITY): Payer: Medicare Other

## 2013-04-13 ENCOUNTER — Ambulatory Visit (HOSPITAL_COMMUNITY)
Admission: RE | Admit: 2013-04-13 | Discharge: 2013-04-13 | Disposition: A | Discharge status: Home / Self Care | Payer: Medicare Other | Source: Ambulatory Visit | Attending: Internal Medicine | Admitting: Internal Medicine

## 2013-04-13 DIAGNOSIS — IMO0001 Reserved for inherently not codable concepts without codable children: Secondary | ICD-10-CM | POA: Insufficient documentation

## 2013-04-13 DIAGNOSIS — I10 Essential (primary) hypertension: Secondary | ICD-10-CM | POA: Insufficient documentation

## 2013-04-13 DIAGNOSIS — R269 Unspecified abnormalities of gait and mobility: Secondary | ICD-10-CM | POA: Diagnosis not present

## 2013-04-13 DIAGNOSIS — M6281 Muscle weakness (generalized): Secondary | ICD-10-CM | POA: Diagnosis not present

## 2013-04-13 NOTE — Progress Notes (Signed)
Physical Therapy Treatment Patient Details  Name: Duane Castaneda MRN: [id number] Date of Birth: 1923-02-06  Today's Date: 04/13/2013 Time: 9562-1308 PT Time Calculation (min): 60 min Charges: 25' NMR, 20' Manual, 1 ice Visit#: 4 of 8  Re-eval: 04/25/13    Authorization: Medicare  Authorization Time Period:    Authorization Visit#: 4 of 10   Subjective: Symptoms/Limitations Symptoms: Pt reports that he is very stiff today.  States he did the bike 2x for 10 minutes on Saturday and had a lot of stiffness afterwards. Reports he has talked with his PCP and Dr. Romeo Apple about his swelling. PCP reports that his blood count was low likely causing increased edema, and suggest keeping his legs elevated, taking iron and drinking ensure to help reduce edema.  Pain Assessment Currently in Pain?: Yes Pain Score:   7 Pain Location: Knee Pain Type: Acute pain;Surgical pain  Precautions/Restrictions     Exercise/Treatments Standing Tandem Gait: 1 rep Retro Gait: 2 reps Sidestepping: 2 reps  Seated Other Seated Exercises: Bike 8 minutes seat 12 backwards and forwards full rotations.  Other Seated Exercises: sit to stand w/min Assist from blue mat x5  Supine    Modalities Modalities: Cryotherapy Manual Therapy Manual Therapy: Edema management Edema Management: Retro massage Rt LE to decrease edema  Physical Therapy Assessment and Plan PT Assessment and Plan Clinical Impression Statement: Added standing balance activities today, pt reuires min assist and max VC to encourage step length and sit to stand.  Concentrated on edema management as pt had increased pain. Educated pt and his caretaker (his daughter) on importance to continue with appropriate compression hose.  Pt has significant reduction in edema as by the end, unable to push visual edema out of foot and calf, continued to have pitting at anterior region of tibia. PT Plan: Continue to improve gait mechanics, independent gait and  improve balance.  Decrease pain with modalities and manual therapy as needed.  Continue to emphasis importance of compression stockings    Goals    Problem List Patient Active Problem List   Diagnosis Date Noted  . S/P total knee replacement 02/23/2013  . Preoperative evaluation to rule out surgical contraindication 12/30/2012  . Essential hypertension, benign 12/30/2012  . Port catheter in place 08/21/2012  . OA (osteoarthritis) of knee 09/03/2011    PT - End of Session Activity Tolerance: Patient tolerated treatment well General Behavior During Therapy: [facility] for tasks assessed/performed Cognition: WFL for tasks performed  GP    Mychael Soots, MPT, ATC 04/13/2013, 1:25 PM

## 2013-04-15 ENCOUNTER — Ambulatory Visit (HOSPITAL_COMMUNITY)
Admission: RE | Admit: 2013-04-15 | Discharge: 2013-04-15 | Disposition: A | Discharge status: Home / Self Care | Payer: Medicare Other | Source: Ambulatory Visit | Attending: Internal Medicine | Admitting: Internal Medicine

## 2013-04-15 NOTE — Progress Notes (Signed)
Physical Therapy Treatment Patient Details  Name: Duane Castaneda MRN: [id number] Date of Birth: May 16, 1923  Today's Date: 04/15/2013 Time: 1512-1600 PT Time Calculation (min): 48 min Charge: Therex 38', Manual 10'  Visit#: 5 of 8  Re-eval: 04/25/13    Authorization: Medicare  Authorization Time Period:    Authorization Visit#: 5 of 10   Subjective: Symptoms/Limitations Symptoms: Pt reported increased stiffness following riding his personal bike at home last weekend.  Pt stated no real pain just stiff today.   Pain Assessment Currently in Pain?: No/denies (stiffness)  Objective:   Exercise/Treatments Aerobic Stationary Bike: 8' seat 12 forwards full revolutions Standing Heel Raises Limitations: toeraises 10 reps individually with cues to decrease substitution Gait Training: no AD 7 RT on carpet to improve gait mechanics  Seated Other Seated Knee Exercises: 7 STS from 22in with hands on lap  Balance Exercises Standing Tandem Gait: 1 rep Retro Gait: 2 reps Sidestepping: 2 reps   Manual Therapy Manual Therapy: Edema management Edema Management: Retro massage Rt LE to decrease edema  Physical Therapy Assessment and Plan PT Assessment and Plan Clinical Impression Statement: Continued standing balance activities with min assistance and max cueing to increase step length.  Gait training complete wtih no AD with cueing for heel to toe pattern gait and to increase stride length and improve posture.  Pt continues to demonstrate weak dorisflexion with gait.  Overall functional strength improing, pt able to complete 5 sit to stand without HHA from 22 in height with no assistance.  Continued with manual retro massage for edema control and refaxed referral for 30-23mmHG thigh high compression hose for Rt LE. PT Plan: Continue to improve gait mechanics, independent gait and improve balance.  Decrease pain with modalities and manual therapy as needed.  Continue to emphasis importance of  compression stockings    Goals    Problem List Patient Active Problem List   Diagnosis Date Noted  . S/P total knee replacement 02/23/2013  . Preoperative evaluation to rule out surgical contraindication 12/30/2012  . Essential hypertension, benign 12/30/2012  . Port catheter in place 08/21/2012  . OA (osteoarthritis) of knee 09/03/2011    PT - End of Session Equipment Utilized During Treatment: Gait belt Activity Tolerance: Patient tolerated treatment well General Behavior During Therapy: WFL for tasks assessed/performed Cognition: WFL for tasks performed PT Plan of Care PT Patient Instructions: education on compression hose and referral sent to MD for 30-40 mmHG Rt LE thigh high compression hose.  GP    [name] 04/15/2013, 4:38 PM

## 2013-04-20 ENCOUNTER — Ambulatory Visit (HOSPITAL_COMMUNITY)
Admission: RE | Admit: 2013-04-20 | Discharge: 2013-04-20 | Disposition: A | Discharge status: Home / Self Care | Payer: Medicare Other | Source: Ambulatory Visit | Attending: Family Medicine | Admitting: Family Medicine

## 2013-04-20 NOTE — Progress Notes (Signed)
Physical Therapy Treatment Patient Details  Name: Duane Castaneda MRN: [id number] Date of Birth: 1922-12-25  Today's Date: 04/20/2013 Time: 4098-1191 PT Time Calculation (min): 39 min Charge: Therex 28', Manual 10'  Visit#: 6 of 8  Re-eval: 04/25/13    Authorization: Medicare  Authorization Time Period:    Authorization Visit#: 6 of 10   Subjective: Symptoms/Limitations Symptoms: Pt stated no real pain just stiff today. Pain Assessment Currently in Pain?: No/denies  Objective:   Exercise/Treatments Aerobic Stationary Bike: 8' seat 12 forwards full revolutions Standing Heel Raises Limitations: toeraises 10 reps individually with cues to decrease substitution Knee Flexion: Both;10 reps Gait Training: no AD 7 RT on carpet to improve gait mechanics  Balance Exercises Standing Tandem Gait: 1 rep Retro Gait: 2 reps Sidestepping: 2 reps      Manual Therapy Manual Therapy: Edema management Edema Management: Retro massage Rt LE to decrease edema  Physical Therapy Assessment and Plan PT Assessment and Plan Clinical Impression Statement: Session focus on improving gait mechanics with no AD, vc-ing required for heel to toe pattern gait and to increase stride length.  Pt conintues to demonstreate weak tibialis anterior altering gait mechanics.  Continued with retro massage for edema control and pt given signed referral for compression hose. PT Plan: Continue to improve gait mechanics, independent gait and improve balance.  Decrease pain with modalities and manual therapy as needed. F/u with compression stockings.    Goals    Problem List Patient Active Problem List   Diagnosis Date Noted  . S/P total knee replacement 02/23/2013  . Preoperative evaluation to rule out surgical contraindication 12/30/2012  . Essential hypertension, benign 12/30/2012  . Port catheter in place 08/21/2012  . OA (osteoarthritis) of knee 09/03/2011    PT - End of Session Equipment Utilized  During Treatment: Gait belt Activity Tolerance: Patient tolerated treatment well General Behavior During Therapy: [facility] for tasks assessed/performed Cognition: WFL for tasks performed  GP    [name] 04/20/2013, 3:32 PM

## 2013-04-22 ENCOUNTER — Ambulatory Visit (HOSPITAL_COMMUNITY)
Admission: RE | Admit: 2013-04-22 | Discharge: 2013-04-22 | Disposition: A | Discharge status: Home / Self Care | Payer: Medicare Other | Source: Ambulatory Visit | Attending: Internal Medicine | Admitting: Internal Medicine

## 2013-04-22 NOTE — Evaluation (Signed)
Physical Therapy Evaluation  Patient Details  Name: Duane Castaneda MRN: [id number] Date of Birth: Oct 11, 1923  Today's Date: 04/22/2013 Time: 1517-1600 PT Time Calculation (min): 43 min Charges: 1 ROM, 1 MMT, 32' NMR, 8' Gait             Visit#: 7 of 15  Re-eval: 05/22/13 Assessment Diagnosis: Rt TKR Surgical Date: 02/08/13 Next MD Visit: Dr. Alto Denver - 5/19  Authorization: Medicare    Authorization Time Period:    Authorization Visit#: 7 of 10   Subjective Symptoms/Limitations Symptoms: Pt reports that he is walking at home some of the time without an AD.  He reports that overall he is still having difficulty with sit to stand activities from standard surfaces and is having a hard time letting go of his cane.  How long can you walk comfortably?: able to go to [employer] and walkied with a grocery cart 15-20 minutes   Cognition/Observation Observation/Other Assessments Observations: significant pitting edema to RLE  Sensation/Coordination/Flexibility/Functional Tests Functional Tests Functional Tests: 30 sec sit to stand w/o UE assist: 2x complete from lowered blue mat surface (was 0x complete)   RLE AROM (degrees) Right Knee Extension: 5 (was 5) Right Knee Flexion: 110 (was 110)  RLE PROM (degrees) Right Knee Extension: 0 Right Knee Flexion: 115  RLE Strength Right Hip Flexion: 5/5 (was 4/5) Right Hip Extension: 3/5 (was 2+/5) Right Hip ABduction: 5/5 (was 3+/5) Right Knee Flexion: 5/5 (was 5/5) Right Knee Extension: 5/5 (was 5/5) Palpation Palpation: no pain or tenderness, stiffness  Mobility/Balance  Ambulation/Gait Assistive device: Straight cane Static Standing Balance Single Leg Stance - Right Leg: 0 (was 0) Single Leg Stance - Left Leg: 0 (was 0) Tandem Stance - Right Leg: 30 (was 6 seconds) Tandem Stance - Left Leg: 30 (was 6 seconds)   Standing Gait Training: with bilateral cane in pt and therapist hand to improve pelvic rotation x8 minutes Standing  Eyes Opened: Wide (BOA);Solid surface;Limitations Standing Eyes Opened Limitations: with shoulder flexion and trunk flexion 2x5 reps Retro Gait: 2 reps Sidestepping: 2 reps Sit to Stand: Elevated surface;Standard surface;Limitations Sit to Stand Limitations: from 24 inch to 18 inch surface without UE A x12 reps total without UE assist Lift / Chop: Both;10 reps;Limitations Left / Chop Limitations: with staggared stance both directions   Physical Therapy Assessment and Plan PT Assessment and Plan Clinical Impression Statement: Mr. Vickerman has attended 7 OP PT visits over the past 4 weeks with the following findings: has improved his overall function with sit to stand, balance and independence with indoor ambulation.  Continues to have limitations with knee AROM (5-110) which may be due to his increased edema.  Pt is awaiting compression hose and until then will be wearing his TED hose perscribed by MD.  At this time feel Mr. Maese will continue to benefit from skilled OP PT to decrease fear of falling and improve overall confidence with ambulation and improve gait mechanics.  Pt will benefit from skilled therapeutic intervention in order to improve on the following deficits: Abnormal gait;Difficulty walking;Decreased strength;Decreased range of motion PT Frequency: Min 2X/week PT Duration: 4 weeks PT Treatment/Interventions: Gait training;Stair training PT Plan: Focus on improving gait mechanics, pelvic rotation, stepping over 6 in. hurdles, stair training, sit to stand strength.     Goals Home Exercise Program Pt will Perform Home Exercise Program: Independently PT Goal: Perform Home Exercise Program - Progress: Met PT Short Term Goals Time to Complete Short Term Goals: 3 weeks PT Short Term  Goal 1: Pt will improve his RLE strength in order to begin ambulating indoors independently. PT Short Term Goal 1 - Progress: Met PT Short Term Goal 2: Pt will improve his functional strength and  demonstrate Sit to standing without UE assit from chair surface for greater ease with getting up from family and friends chairs.  PT Short Term Goal 2 - Progress: Progressing toward goal PT Short Term Goal 3: Pt will reduce knee edema to WNL of LLE for improved muscle function.  (is awaiting compression stockings) PT Short Term Goal 3 - Progress: Progressing toward goal PT Long Term Goals Time to Complete Long Term Goals:  (6 weeks) PT Long Term Goal 1: Pt will improve functional strength and ascend and descend 5 steps with 1 handrail with reciprocal pattern to ascend and step to pattern to descend.  PT Long Term Goal 1 - Progress: Met PT Long Term Goal 2: Pt will improve his dynamic balance and demonstrate independent ambulation on outdoor surfaces for improved QOL.   PT Long Term Goal 2 - Progress: Progressing toward goal Long Term Goal 3: Pt will imporve his LEFS to 64/60 for improved QOL.  Long Term Goal 3 Progress: Progressing toward goal Long Term Goal 4: Pt will improve his knee AROM 0-115 degrees for improved mechanics with ambulation to decrease risk of secondary injuries.  Long Term Goal 4 Progress: Progressing toward goal  Problem List Patient Active Problem List   Diagnosis Date Noted  . S/P total knee replacement 02/23/2013  . Preoperative evaluation to rule out surgical contraindication 12/30/2012  . Essential hypertension, benign 12/30/2012  . Port catheter in place 08/21/2012  . OA (osteoarthritis) of knee 09/03/2011    PT - End of Session Equipment Utilized During Treatment: Gait belt Activity Tolerance: Patient tolerated treatment well General Behavior During Therapy: [facility] for tasks assessed/performed Cognition: WFL for tasks performed  Harleigh Civello, MPT, ATC 04/22/2013, 4:16 PM  Physician Documentation Your signature is required to indicate approval of the treatment plan as stated above.  Please sign and either send electronically or make a copy of this report for  your files and return this physician signed original.   Please mark one 1.__approve of plan  2. ___approve of plan with the following conditions.   ______________________________                                                          _____________________ Physician Signature                                                                                                             Date

## 2013-04-26 ENCOUNTER — Ambulatory Visit (HOSPITAL_COMMUNITY)
Admission: RE | Admit: 2013-04-26 | Discharge: 2013-04-26 | Disposition: A | Discharge status: Home / Self Care | Payer: Medicare Other | Source: Ambulatory Visit | Attending: Internal Medicine | Admitting: Internal Medicine

## 2013-04-26 NOTE — Progress Notes (Signed)
Physical Therapy Treatment Patient Details  Name: Duane Castaneda MRN: [id number] Date of Birth: 09/21/1923  Today's Date: 04/26/2013 Time: 1478-2956 PT Time Calculation (min): 49 min Visit#: 8 of 15  Re-eval: 05/22/13 Charges:  therex 24', NMR 15' Authorization: Medicare  Authorization Visit#: 8 of 10   Subjective: Symptoms/Limitations Symptoms: Pt states he got his compression hose and is wearing them.  Reports no pain other than discomfort in Rt knee with certain activities.  Pt states he has been walking up to 8 minutes on his treadmill at home with his daughters assistance.   Exercise/Treatments Aerobic Stationary Bike: 8' seat 12 forwards full revolutions Standing Other Standing Knee Exercises: retro gait, side stepping 1RT each Other Standing Knee Exercises: balance beam tandem gait using SPC 1RT Seated Other Seated Knee Exercises: 5 STS from 20.5 in (blue foam in chair) no UE, min assist     Physical Therapy Assessment and Plan PT Assessment and Plan Clinical Impression Statement: Able to progress to balance beam today, however pt. reluctant to attempt.  Pt with decreased confidence to attempt some balance activities and requires encouragement to complete.  Able to decrease 1.5 inch rise in sit to stand without use of UE's however pt. required min assist from therapist and cues to shift weight to right side.   Pt is wearing his thigh high compression hose today and reports his LE's feel bettter overall. Pt will benefit from skilled therapeutic intervention in order to improve on the following deficits: Abnormal gait;Difficulty walking;Decreased strength;Decreased range of motion PT Frequency: Min 2X/week PT Duration: 4 weeks PT Treatment/Interventions: Gait training;Stair training PT Plan: Focus on improving gait mechanics, pelvic rotation, stepping over 6 in. hurdles, stair training, sit to stand strength.       Problem List Patient Active Problem List   Diagnosis  Date Noted  . S/P total knee replacement 02/23/2013  . Preoperative evaluation to rule out surgical contraindication 12/30/2012  . Essential hypertension, benign 12/30/2012  . Port catheter in place 08/21/2012  . OA (osteoarthritis) of knee 09/03/2011    PT - End of Session Equipment Utilized During Treatment: Gait belt Activity Tolerance: Patient tolerated treatment well General Behavior During Therapy: [facility] for tasks assessed/performed Cognition: WFL for tasks performed   Lurena Nida, PTA/CLT 04/26/2013, 3:30 PM

## 2013-04-27 ENCOUNTER — Ambulatory Visit (HOSPITAL_COMMUNITY)
Admission: RE | Admit: 2013-04-27 | Discharge: 2013-04-27 | Disposition: A | Discharge status: Home / Self Care | Payer: Medicare Other | Source: Ambulatory Visit | Attending: Internal Medicine | Admitting: Internal Medicine

## 2013-04-27 NOTE — Progress Notes (Signed)
Physical Therapy Treatment Patient Details  Name: Duane Castaneda MRN: [id number] Date of Birth: [birth year]  Today's Date: 04/27/2013 Time: 1518-1600 PT Time Calculation (min): 42 min Charge: Gait 8', therex 22', NMR 12  Visit#: 9 of 15  Re-eval: 05/22/13    Authorization: Medicare  Authorization Time Period:    Authorization Visit#: 9 of 10   Subjective: Symptoms/Limitations Symptoms: Pt reported riding bike at home this morning x 5 min.  Currently pain free, states pain can increase to 4-5/10 with activities.   Pain Assessment Currently in Pain?: Yes Pain Score:   4 Pain Location: Knee Pain Orientation: Right  Objective :  Exercise/Treatments Aerobic Tread Mill: Gait training @ .40 cyc/sec with multimodal cueing for heel to toe pattern, posture and to increase stride length x 6 min Standing Gait Training: without AD Other Standing Knee Exercises: partial tandem, retro and side stepping 2Rt Other Standing Knee Exercises: Cone rotation on foam 1RT Bil UE; 6in hurdles alternating 3RT with SPC Seated Other Seated Knee Exercises: 5 STS from 20.5 in (blue foam in chair) no UE, min assist    Physical Therapy Assessment and Plan PT Assessment and Plan Clinical Impression Statement: Began gait training without AD and onTM to improve mechanics with max cueing for posture, heel to toe pattern gait and to increase stride length.  Progressed balance activities to improve pelvic rotation and obstacles with min-mod assistance for LOB episodes.  Pt limited by fatigue end of session, no reports of pain. PT Plan: G-Code due next session.  Focus on improving gait mechanics, pelvic rotation, stepping over 6 in. hurdles, stair training, sit to stand strength.     Goals    Problem List Patient Active Problem List   Diagnosis Date Noted  . S/P total knee replacement 02/23/2013  . Preoperative evaluation to rule out surgical contraindication 12/30/2012  . Essential hypertension, benign  12/30/2012  . Port catheter in place 08/21/2012  . OA (osteoarthritis) of knee 09/03/2011    PT - End of Session Equipment Utilized During Treatment: Gait belt Activity Tolerance: Patient tolerated treatment well General Behavior During Therapy: [facility] for tasks assessed/performed Cognition: WFL for tasks performed  GP    [name] 04/27/2013, 6:01 PM

## 2013-05-04 ENCOUNTER — Ambulatory Visit (HOSPITAL_COMMUNITY)
Admission: RE | Admit: 2013-05-04 | Discharge: 2013-05-04 | Disposition: A | Discharge status: Home / Self Care | Payer: Medicare Other | Source: Ambulatory Visit | Attending: Family Medicine | Admitting: Family Medicine

## 2013-05-04 NOTE — Progress Notes (Signed)
Physical Therapy Treatment Patient Details  Name: Duane Castaneda MRN: [id number] Date of Birth: 1923/12/02  Today's Date: 05/04/2013 Time: 9147-8295 PT Time Calculation (min): 60 min Charge: Gait 12', Therex 23', NMR 15'   Visit#: 10 of 15  Re-eval: 05/22/13 Assessment Diagnosis: Rt TKR Surgical Date: 02/08/13 Next MD Visit: Dr. Alto Denver - 05/11/2013 Prior Therapy: SNF  Authorization: Medicare  Authorization Time Period:    Authorization Visit#: 10 of 20   Subjective: Symptoms/Limitations Symptoms: Pt.'s daughter reported noted decrease in edema with compression hose.  Pt stated he rode bike at home x 6 minutes and walked on TM this weekend.  Pain free. Pain Assessment Currently in Pain?: No/denies  Objective:  LEFS 31/80  Exercise/Treatments Aerobic Tread Mill: Gait training @ .40 cyc/sec with multimodal cueing for heel to toe pattern, posture and to increase stride length x 8 min Standing Heel Raises: 10 reps;Limitations Heel Raises Limitations: toeraises 10 reps individually with cues to decrease substitution Lateral Step Up: Right;10 reps;Hand Hold: 2;Step Height: 4" Forward Step Up: Right;10 reps;Hand Hold: 2;Step Height: 4" Functional Squat: 10 reps Other Standing Knee Exercises: tandem stance with Lt LE on 4in step 2x 30" Other Standing Knee Exercises: Cone rotation on foam 1RT Bil UE;   Seated Other Seated Knee Exercises: 5 STS from 20.5 in (blue foam in chair) no UE, min assist      Physical Therapy Assessment and Plan PT Assessment and Plan Clinical Impression Statement: Pt improving gait mechanics with no AD but does required max cueing for posture, heel to toe patternt gait and to increase stride length, increased time on gait training for activity tolerance.  Pt able to demonstrate approriate mechanics on treadmilll though limited by fatigue.  Resumed stair training for functional strengthening with multimodal cueing required for proper technique.  Pelvic  rotation continues to be very limited.   PT Plan: Focus on improving gait mechanics, pelvic rotation, stepping over 6 in. hurdles, stair training, sit to stand strength.  Begin step down and reaching #1-15 on foam next session.    Goals    Problem List Patient Active Problem List   Diagnosis Date Noted  . S/P total knee replacement 02/23/2013  . Preoperative evaluation to rule out surgical contraindication 12/30/2012  . Essential hypertension, benign 12/30/2012  . Port catheter in place 08/21/2012  . OA (osteoarthritis) of knee 09/03/2011    PT - End of Session Equipment Utilized During Treatment: Gait belt Activity Tolerance: Patient tolerated treatment well General Behavior During Therapy: WFL for tasks assessed/performed Cognition: WFL for tasks performed  GP Functional Limitation: Mobility: Walking and moving around Mobility: Walking and Moving Around Current Status [phone]): At least 20 percent but less than 40 percent impaired, limited or restricted Mobility: Walking and Moving Around Goal Status [phone]): At least 1 percent but less than 20 percent impaired, limited or restricted  [name] 05/04/2013, 5:34 PM

## 2013-05-06 ENCOUNTER — Encounter (HOSPITAL_COMMUNITY): Payer: Medicare Other | Attending: Oncology

## 2013-05-06 ENCOUNTER — Ambulatory Visit (HOSPITAL_COMMUNITY)
Admission: RE | Admit: 2013-05-06 | Discharge: 2013-05-06 | Disposition: A | Discharge status: Home / Self Care | Payer: Medicare Other | Source: Ambulatory Visit | Attending: Internal Medicine | Admitting: Internal Medicine

## 2013-05-06 DIAGNOSIS — C8589 Other specified types of non-Hodgkin lymphoma, extranodal and solid organ sites: Secondary | ICD-10-CM

## 2013-05-06 DIAGNOSIS — C8590 Non-Hodgkin lymphoma, unspecified, unspecified site: Secondary | ICD-10-CM

## 2013-05-06 DIAGNOSIS — Z452 Encounter for adjustment and management of vascular access device: Secondary | ICD-10-CM

## 2013-05-06 LAB — COMPREHENSIVE METABOLIC PANEL
AST: 11 U/L (ref 0–37)
BUN: 26 mg/dL — ABNORMAL HIGH (ref 6–23)
CO2: 26 mEq/L (ref 19–32)
Chloride: 102 mEq/L (ref 96–112)
Creatinine, Ser: 1.33 mg/dL (ref 0.50–1.35)
GFR calc non Af Amer: 46 mL/min — ABNORMAL LOW (ref >90)
Total Bilirubin: 0.2 mg/dL — ABNORMAL LOW (ref 0.3–1.2)

## 2013-05-06 LAB — CBC WITH DIFFERENTIAL/PLATELET
Basophils Absolute: 0 10*3/uL (ref 0.0–0.1)
HCT: 30.7 % — ABNORMAL LOW (ref 39.0–52.0)
Hemoglobin: 10 g/dL — ABNORMAL LOW (ref 13.0–17.0)
Lymphocytes Relative: 9 % — ABNORMAL LOW (ref 12–46)
Lymphs Abs: 0.7 10*3/uL (ref 0.7–4.0)
Monocytes Absolute: 0.6 10*3/uL (ref 0.1–1.0)
Monocytes Relative: 8 % (ref 3–12)
Neutro Abs: 5.7 10*3/uL (ref 1.7–7.7)
RBC: 3.52 MIL/uL — ABNORMAL LOW (ref 4.22–5.81)
WBC: 7.2 10*3/uL (ref 4.0–10.5)

## 2013-05-06 MED ORDER — HEPARIN SOD (PORK) LOCK FLUSH 100 UNIT/ML IV SOLN
500.0000 [IU] | Freq: Once | INTRAVENOUS | Status: AC
  Administered 2013-05-06: 500 [IU] via INTRAVENOUS
  Filled 2013-05-06: qty 5

## 2013-05-06 MED ORDER — HEPARIN SOD (PORK) LOCK FLUSH 100 UNIT/ML IV SOLN
INTRAVENOUS | Status: AC
  Filled 2013-05-06: qty 5

## 2013-05-06 MED ORDER — SODIUM CHLORIDE 0.9 % IJ SOLN
10.0000 mL | INTRAMUSCULAR | Status: DC | PRN
  Administered 2013-05-06: 10 mL via INTRAVENOUS
  Filled 2013-05-06: qty 10

## 2013-05-06 NOTE — Progress Notes (Signed)
Physical Therapy Treatment Patient Details  Name: Duane Castaneda MRN: [id number] Date of Birth: Apr 10, 1923  Today's Date: 05/06/2013 Time: 1508-1600 PT Time Calculation (min): 52 min Charge: Gait x 10' (no charge), therex 19', NMR 23'  Visit#: 11 of 15  Re-eval: 05/22/13 Assessment Diagnosis: Rt TKR Surgical Date: 02/08/13 Next MD Visit: Dr. Alto Denver - 05/11/2013 Prior Therapy: SNF  Authorization: Medicare  Authorization Time Period: Gcode 10th visit  Authorization Visit#: 11 of 20   Subjective: Symptoms/Limitations Symptoms: Pain free today,  Pt reported ridding bike this morning and went shopping with daughter yesterday, pushing cart for 30 minutes, really tired following.   Pain Assessment Currently in Pain?: No/denies  Precautions/Restrictions     Exercise/Treatments Aerobic Tread Mill: Gait training @ 1.0 mph x 10 minutes with cueing for posture and to increase stride length. Standing Heel Raises: 10 reps;Limitations Heel Raises Limitations: toeraises 10 reps individually with cues to decrease substitution Knee Flexion: Right;15 reps Lateral Step Up: Right;10 reps;Hand Hold: 2;Step Height: 4" Forward Step Up: Right;10 reps;Hand Hold: 2;Step Height: 4" Step Down: Right;10 reps;Hand Hold: 2;Step Height: 4" Functional Squat: 15 reps Other Standing Knee Exercises: tandem stance with Lt LE on 4in step 2x 30"; sidestepping 2RT, retro gait 2RT Other Standing Knee Exercises: Cone rotation on foam 1RT Bil UE; 6 in hurdles alternating 2RT; # 1-15;  fast toe tapping on 2 in step x 15 each Seated Other Seated Knee Exercises: 5 STS from 20.5 in (blue foam in chair) no UE, min assist     Physical Therapy Assessment and Plan PT Assessment and Plan Clinical Impression Statement: Nicholes Rough, PTT started treatment for gait training on TM.  PTT stated main cueing with gait training for posture and to increase stride length with heel to toe pattern.  Added step down for eccentric  quad control, pt continues to require multimodal cueing with stair training for proper technique for LE strengthening.  Added balance activities to improve reaching outside BOS, pelvic rotation, toe tapping and stepping over 6in hurdles, with min assistance for LOB episodes with new activiites and cueing for spatial awareness PT Plan: Focus on improving gait mechanics, pelvic rotation, stepping over 6 in. hurdles, stair training, sit to stand strength.      Goals    Problem List Patient Active Problem List   Diagnosis Date Noted  . S/P total knee replacement 02/23/2013  . Preoperative evaluation to rule out surgical contraindication 12/30/2012  . Essential hypertension, benign 12/30/2012  . Port catheter in place 08/21/2012  . OA (osteoarthritis) of knee 09/03/2011    PT - End of Session Equipment Utilized During Treatment: Gait belt Activity Tolerance: Patient tolerated treatment well;Patient limited by fatigue General Behavior During Therapy: [facility] for tasks assessed/performed Cognition: WFL for tasks performed  GP    [name] 05/06/2013, 4:20 PM

## 2013-05-06 NOTE — Progress Notes (Signed)
Kristine Linea presented for Portacath access and flush. Proper placement of portacath confirmed by CXR. Portacath located lt chest wall accessed with  H 20 needle. No blood return and flushes easily. Portacath flushed with 20ml NS and 500U/28ml Heparin and needle removed intact. Procedure without incident. Patient tolerated procedure well.  labs performed today at daughters request. She states he was told to start iron pills due to anemia after knee surgery.   Labs per MD order drawn via Peripheral Line 25 gauge needle inserted in lt ac.   Good blood return present. Procedure without incident.  Needle removed intact. Patient tolerated procedure well.

## 2013-05-11 ENCOUNTER — Ambulatory Visit (INDEPENDENT_AMBULATORY_CARE_PROVIDER_SITE_OTHER): Payer: Medicare Other | Admitting: Orthopedic Surgery

## 2013-05-11 ENCOUNTER — Ambulatory Visit (HOSPITAL_COMMUNITY)
Admission: RE | Admit: 2013-05-11 | Discharge: 2013-05-11 | Disposition: A | Discharge status: Home / Self Care | Payer: Medicare Other | Source: Ambulatory Visit | Attending: Family Medicine | Admitting: Family Medicine

## 2013-05-11 VITALS — BP 120/66 | Ht 69.0 in | Wt 200.0 lb

## 2013-05-11 DIAGNOSIS — IMO0001 Reserved for inherently not codable concepts without codable children: Secondary | ICD-10-CM | POA: Insufficient documentation

## 2013-05-11 DIAGNOSIS — I10 Essential (primary) hypertension: Secondary | ICD-10-CM | POA: Insufficient documentation

## 2013-05-11 DIAGNOSIS — M6281 Muscle weakness (generalized): Secondary | ICD-10-CM | POA: Diagnosis not present

## 2013-05-11 DIAGNOSIS — R269 Unspecified abnormalities of gait and mobility: Secondary | ICD-10-CM | POA: Insufficient documentation

## 2013-05-11 DIAGNOSIS — M1710 Unilateral primary osteoarthritis, unspecified knee: Secondary | ICD-10-CM

## 2013-05-11 DIAGNOSIS — IMO0002 Reserved for concepts with insufficient information to code with codable children: Secondary | ICD-10-CM

## 2013-05-11 NOTE — Progress Notes (Signed)
Patient ID: Duane Castaneda, male   DOB: 17-Feb-1923, 79 y.o.   MRN: [id number] Chief Complaint  Patient presents with  . Follow-up    6 week recheck right TKA DOS 02/08/13    Postop 3 month visit right total knee patient doing well not having any pain ambulate with a cane outside the house and unsupported in the house knee flexion 115 for a 5 flexion contracture in extension power  Continue activities as tolerated followup in 3 months

## 2013-05-11 NOTE — Progress Notes (Signed)
Physical Therapy Treatment Patient Details  Name: Duane Castaneda MRN: [id number] Date of Birth: 08-29-1923  Today's Date: 05/11/2013 Time: 1516-1600 PT Time Calculation (min): 44 min TE: [id number]  NMR: 1526-1600 Visit#: 12 of 15  Re-eval: 05/22/13 Assessment Diagnosis: Rt TKR Surgical Date: 02/08/13 Next MD Visit: Dr. Alto Denver - 05/11/2013  Authorization: Medicare  Authorization Time Period: Roselind Rily 10th visit  Authorization Visit#: 12 of 20   Subjective: Symptoms/Limitations Symptoms: Pt reports he saw the MD today with favorable results.  Reports continued difficulty with independent ambulation.  Pain Assessment Currently in Pain?: No/denies  Precautions/Restrictions     Exercise/Treatments Aerobic Stationary Bike: NuStep: 10 minutes [place] #2, reistance 2 for activity tolerance Standing Tandem Gait: 1 rep;Limitations Tandem Gait Limitations: mod A, 30 sec holds 1/2 round trip Retro Gait: 2 reps;Limitations Retro Gait Limitations: mod A Sidestepping: 2 reps;Limitations Sidestepping Limitations: mod A Step Over Hurdles / Cones: 6 inch alternating 2 RT Cone Rotation: Foam;Right turn;Left turn     Physical Therapy Assessment and Plan PT Assessment and Plan Clinical Impression Statement: Treatment started by Annett Fabian, PT and finished with PTA (CC).  Continues to demonstrate increased fear of ambulating on his own and grabs therapist requiring mod A, however demonstrates independent ambulation with impaired gait mechanics.  Overall continues to progress functional strength and AROM.  PT Plan: Focus on improving gait mechanics, pelvic rotation, stepping over 6 in. hurdles, stair training, sit to stand strength.      Goals Home Exercise Program Pt will Perform Home Exercise Program: Independently PT Goal: Perform Home Exercise Program - Progress: Met PT Short Term Goals Time to Complete Short Term Goals: 3 weeks PT Short Term Goal 1: Pt will improve his RLE strength  in order to begin ambulating indoors independently. PT Short Term Goal 1 - Progress: Met PT Short Term Goal 2: Pt will improve his functional strength and demonstrate Sit to standing without UE assit from chair surface for greater ease with getting up from family and friends chairs.  PT Short Term Goal 2 - Progress: Progressing toward goal PT Short Term Goal 3: Pt will reduce knee edema to WNL of LLE for improved muscle function.  (is awaiting compression stockings) PT Short Term Goal 3 - Progress: Progressing toward goal PT Long Term Goals Time to Complete Long Term Goals:  (6 weeks) PT Long Term Goal 1: Pt will improve functional strength and ascend and descend 5 steps with 1 handrail with reciprocal pattern to ascend and step to pattern to descend.  PT Long Term Goal 1 - Progress: Progressing toward goal PT Long Term Goal 2: Pt will improve his dynamic balance and demonstrate independent ambulation on outdoor surfaces for improved QOL.   PT Long Term Goal 2 - Progress: Progressing toward goal Long Term Goal 3: Pt will imporve his LEFS to 64/60 for improved QOL.  Long Term Goal 3 Progress: Progressing toward goal Long Term Goal 4: Pt will improve his knee AROM 0-115 degrees for improved mechanics with ambulation to decrease risk of secondary injuries.  Long Term Goal 4 Progress: Progressing toward goal  Problem List Patient Active Problem List   Diagnosis Date Noted  . S/P total knee replacement 02/23/2013  . Preoperative evaluation to rule out surgical contraindication 12/30/2012  . Essential hypertension, benign 12/30/2012  . Port catheter in place 08/21/2012  . OA (osteoarthritis) of knee 09/03/2011    PT - End of Session Equipment Utilized During Treatment: Gait belt Activity Tolerance: Patient tolerated treatment well;Patient  limited by fatigue General Behavior During Therapy: [facility] for tasks assessed/performed Cognition: [facility] for tasks performed  GP    Teondre Jarosz, MPT,  ATC 05/11/2013, 4:06 PM

## 2013-05-11 NOTE — Patient Instructions (Addendum)
Continue Iron for one month Go back on 81mg  baby aspirin

## 2013-05-13 ENCOUNTER — Ambulatory Visit (HOSPITAL_COMMUNITY)
Admission: RE | Admit: 2013-05-13 | Discharge: 2013-05-13 | Disposition: A | Discharge status: Home / Self Care | Payer: Medicare Other | Source: Ambulatory Visit | Attending: Family Medicine | Admitting: Family Medicine

## 2013-05-13 DIAGNOSIS — I10 Essential (primary) hypertension: Secondary | ICD-10-CM | POA: Diagnosis not present

## 2013-05-13 DIAGNOSIS — IMO0001 Reserved for inherently not codable concepts without codable children: Secondary | ICD-10-CM | POA: Diagnosis not present

## 2013-05-13 DIAGNOSIS — M6281 Muscle weakness (generalized): Secondary | ICD-10-CM | POA: Diagnosis not present

## 2013-05-13 DIAGNOSIS — R269 Unspecified abnormalities of gait and mobility: Secondary | ICD-10-CM | POA: Diagnosis not present

## 2013-05-13 NOTE — Progress Notes (Signed)
Physical Therapy Treatment Patient Details  Name: Duane Castaneda MRN: [id number] Date of Birth: Oct 06, 1923  Today's Date: 05/13/2013 Time: 1510-1600 PT Time Calculation (min): 50 min Charge; TE 27' 1510-1537, NMR 23' 1538-1600  Visit#: 13 of 15  Re-eval: 05/22/13    Authorization: Medicare  Authorization Time Period: Gcode 10th visit  Authorization Visit#: 13 of 20   Subjective: Symptoms/Limitations Symptoms: Pt reports most difficulty with standing from sitting and balance activities, pt does feel like he is getting stronger.  Reports riding bicycle this morning, no pain Pain Assessment Currently in Pain?: No/denies  Objective:   Exercise/Treatments Standing Lateral Step Up: Right;10 reps;Hand Hold: 2;Step Height: 4" Forward Step Up: Right;10 reps;Hand Hold: 2;Step Height: 4" Step Down: Right;10 reps;Hand Hold: 2;Step Height: 4" Wall Squat: 5 reps Other Standing Knee Exercises: tandem, retro and sidestepping 2RT Other Standing Knee Exercises: Cone rotation on foam 1RT Bil UE; 6 in hurdles alternating 2RT; # 1-15;  fast toe tapping on 2 in step x 15 each Seated Other Seated Knee Exercises: 8 STS from 20.5 in (blue foam in chair) no UE, min assist     Physical Therapy Assessment and Plan PT Assessment and Plan Clinical Impression Statement: Continued treatement focus on balance, functional strengthening and gait training with no AD.  Pt continues to demonstrate increased fear of ambulating no AD wiith balance activiteis and grabs therapist requiring mod A.  Pt able to ambulate independently with no LOB episodes however continues to require cueing to improve impaired gait mechanics.  Added wall squats to POC for functional strenghtening to improve sit to stand. PT Plan: Focus on improving gait mechanics, pelvic rotation, stepping over 6 in. hurdles, stair training, sit to stand strength.      Goals    Problem List Patient Active Problem List   Diagnosis Date Noted  .  S/P total knee replacement 02/23/2013  . Preoperative evaluation to rule out surgical contraindication 12/30/2012  . Essential hypertension, benign 12/30/2012  . Port catheter in place 08/21/2012  . OA (osteoarthritis) of knee 09/03/2011    PT - End of Session Equipment Utilized During Treatment: Gait belt Activity Tolerance: Patient tolerated treatment well;Patient limited by fatigue General Behavior During Therapy: [facility] for tasks assessed/performed Cognition: WFL for tasks performed  GP    [name] 05/13/2013, 4:28 PM

## 2013-05-18 ENCOUNTER — Ambulatory Visit (HOSPITAL_COMMUNITY)
Admission: RE | Admit: 2013-05-18 | Discharge: 2013-05-18 | Disposition: A | Discharge status: Home / Self Care | Payer: Medicare Other | Source: Ambulatory Visit | Attending: Internal Medicine | Admitting: Internal Medicine

## 2013-05-18 DIAGNOSIS — R269 Unspecified abnormalities of gait and mobility: Secondary | ICD-10-CM | POA: Diagnosis not present

## 2013-05-18 DIAGNOSIS — IMO0001 Reserved for inherently not codable concepts without codable children: Secondary | ICD-10-CM | POA: Diagnosis not present

## 2013-05-18 DIAGNOSIS — M6281 Muscle weakness (generalized): Secondary | ICD-10-CM | POA: Diagnosis not present

## 2013-05-18 DIAGNOSIS — I10 Essential (primary) hypertension: Secondary | ICD-10-CM | POA: Diagnosis not present

## 2013-05-18 NOTE — Progress Notes (Signed)
Physical Therapy Treatment Patient Details  Name: Duane Castaneda MRN: [id number] Date of Birth: [birth year]  Today's Date: 05/18/2013 Time: 4098-1191 PT Time Calculation (min): 38 min Charges NMR: 1515-1540,   Gait: 1540-1553 Visit#: 14 of 15  Re-eval: 05/22/13    Authorization: Medicare  Authorization Time Period: Roselind Rily 10th visit  Authorization Visit#: 14 of 20   Subjective: Symptoms/Limitations Symptoms: Im still not walking right.  Pain Assessment Currently in Pain?: No/denies  Precautions/Restrictions     Exercise/Treatments Standing Gait Training: outdoor sufraces w/supervision ramps, concrete, uneven surface.  Indoor surfaces with TC for pelvic rotaiton.  Standing Eyes Opened: Narrow base of support (BOS);Head turns;Solid surface;5 reps;Time Standing Eyes Opened Time: 1 rep 60 sec static Standing Eyes Opened Limitations: pertabaltions with static standing Standing Eyes Closed: Narrow base of support (BOS);Solid surface;Limitations Standing Eyes Closed Limitations: pertabations Tandem Stance: Eyes open;2 reps;30 secs;Limitations Tandem Stance Limitations: parital tandem Standing, One Foot on a Step: Eyes open;4 inch;2 reps;30 secs;Limitations Standing, One Foot on a Step Limitations: BLE Turning: Both;3 reps Cone Rotation: Solid surface;Right turn;Left turn;Limitations Cone Rotation Limitations: 5 reps without cones Marching: Solid surface;10 reps Sit to Stand: Standard surface;Limitations Sit to Stand Limitations: Min A from 5 reps Lift / Chop: Both;5 reps Other Standing Exercises: Heel and Toe walking 1 RT   Physical Therapy Assessment and Plan PT Assessment and Plan Clinical Impression Statement: All activities performed today without allowance of UE assistance to improve confidence with balance.  Pt continues to express concerns with fear of falling and limitations with pelvic rotation during gait.  Added activities to improve balance, gait and pelvic  rotation.  PT Plan: Re-eval next visit    Goals    Problem List Patient Active Problem List   Diagnosis Date Noted  . S/P total knee replacement 02/23/2013  . Preoperative evaluation to rule out surgical contraindication 12/30/2012  . Essential hypertension, benign 12/30/2012  . Port catheter in place 08/21/2012  . OA (osteoarthritis) of knee 09/03/2011    PT - End of Session Equipment Utilized During Treatment: Gait belt Activity Tolerance: Patient tolerated treatment well;Patient limited by fatigue General Behavior During Therapy: [facility] for tasks assessed/performed Cognition: [facility] for tasks performed  GP    Nedda Gains, MPT ATC 05/18/2013, 4:04 PM

## 2013-05-20 ENCOUNTER — Ambulatory Visit (HOSPITAL_COMMUNITY): Payer: Medicare Other | Admitting: Physical Therapy

## 2013-05-25 ENCOUNTER — Ambulatory Visit (HOSPITAL_COMMUNITY)
Admission: RE | Admit: 2013-05-25 | Discharge: 2013-05-25 | Disposition: A | Discharge status: Home / Self Care | Payer: Medicare Other | Source: Ambulatory Visit | Attending: Internal Medicine | Admitting: Internal Medicine

## 2013-05-25 DIAGNOSIS — IMO0001 Reserved for inherently not codable concepts without codable children: Secondary | ICD-10-CM | POA: Diagnosis not present

## 2013-05-25 DIAGNOSIS — R269 Unspecified abnormalities of gait and mobility: Secondary | ICD-10-CM | POA: Diagnosis not present

## 2013-05-25 DIAGNOSIS — I10 Essential (primary) hypertension: Secondary | ICD-10-CM | POA: Diagnosis not present

## 2013-05-25 DIAGNOSIS — M6281 Muscle weakness (generalized): Secondary | ICD-10-CM | POA: Diagnosis not present

## 2013-05-25 NOTE — Progress Notes (Addendum)
Physical Therapy Discharge/Treatment Patient Details  Name: JIGAR ZIELKE MRN: [id number] Date of Birth: [birth year]  Today's Date: 05/25/2013 Time: 4098-1191 PT Time Calculation (min): 43 min Charge TE 25' [phone]), MMT (no charge), ROM Measurement x 1, Self care 8' [phone])              Visit#: 15 of 15  Re-eval:    Diagnosis: Rt TKR Surgical Date: 02/08/13 Next MD Visit: Dr. Alto Denver - September Prior Therapy: SNF  Authorization: Medicare    Authorization Time Period: Gcode 10th and 15th visit  Authorization Visit#: 15 of 20   Subjective Symptoms/Limitations Symptoms: Getting up to stand from sitting continues to be difficult, my walking is improving.  No pain  Pt stated he feels his knee is back to 100%, pt. c/o pain Bil spurs on feet that hurts when he walks and alters his gait mechanics.   How long can you sit comfortably?: no difficulty How long can you stand comfortably?: 15 minutes, able to cook breakfast How long can you walk comfortably?: 30 minutes pushing cart in [employer] and able to walk outside without AD Pain Assessment Currently in Pain?: No/denies  Sensation/Coordination/Flexibility/Functional Tests Functional Tests Functional Tests: LEFS 40/80 (was LEFS 31/80)  RLE AROM (degrees) Right Knee Extension: 2 Right Knee Flexion: 113  RLE PROM (degrees) Right Knee Extension: 0 Right Knee Flexion: 115  RLE Strength Right Hip Extension: 3/5 (was 3/5)  Exercise/Treatments Standing Stairs: 2RT 1 HR Seated Other Seated Knee Exercises: 5 STS from 20 in mat with no HHA Supine Quad Sets: Right;10 reps Heel Slides: Right;5 reps Knee Extension: PROM Knee Flexion: PROM  Physical Therapy Assessment and Plan PT Assessment and Plan Clinical Impression Statement: Re-eval complete.  Mr Schear has had 15 OPPT sessions over 9 weeks with the following findings:  Pt independent with HEP and able to demonstrate appropriate technique with all exercises and has  been riding bicycle and walking on treadmill at home.  Pt with improved functional strength all WNL except for gluteus maximus which continues to be weak.  Pt with improved gait mechanics and ability to demonstrate safe outdoor ambulation independently.  Pt does have slight antalgic gait mechanics due to heel spur pain.  Pt able to ascend and descend stair reciprocally with one handrail safely.  Pt has purchased compression hose for edema control.  Pt with increased perceived LE functional scale abilities and stated he feels knee has improved 100% with PT.  Improved AROM 2-113, PROM 0-115.  Following discussion with daughter and pt decision was made to discharge to HEP PT Plan: D/C to HEP    Goals Pt will Perform Home Exercise Program: Independently: Met PT Short Term Goals: 3 weeks PT Short Term Goal 1: Pt will improve his RLE strength in order to begin ambulating indoors independently.: Met PT Short Term Goal 2: Pt will improve his functional strength and demonstrate Sit to standing without UE assit from chair surface for greater ease with getting up from family and friends chairs. : Progressing toward goal PT Short Term Goal 3: Pt will reduce knee edema to WNL of LLE for improved muscle function.  (is awaiting compression stockings): Met PT Long Term Goals:  (6 weeks) PT Long Term Goal 1: Pt will improve functional strength and ascend and descend 5 steps with 1 handrail with reciprocal pattern to ascend and step to pattern to descend. : Met PT Long Term Goal 2: Pt will improve his dynamic balance and demonstrate independent ambulation on outdoor surfaces  for improved QOL.  : Progressing toward goal Long Term Goal 3: Pt will imporve his LEFS to 64/60 for improved QOL. : Progressing toward goal (LEFS 40/80) Long Term Goal 4: Pt will improve his knee AROM 0-115 degrees for improved mechanics with ambulation to decrease risk of secondary injuries. : Progressing toward goal  Problem List Patient Active  Problem List   Diagnosis Date Noted  . S/P total knee replacement 02/23/2013  . Preoperative evaluation to rule out surgical contraindication 12/30/2012  . Essential hypertension, benign 12/30/2012  . Port catheter in place 08/21/2012  . OA (osteoarthritis) of knee 09/03/2011    PT - End of Session Activity Tolerance: Patient tolerated treatment well General Behavior During Therapy: WFL for tasks assessed/performed Cognition: WFL for tasks performed  GP Functional Assessment Tool Used: Clinical judgement and LEFS Functional Limitation: Mobility: Walking and moving around Mobility: Walking and Moving Around Goal Status [phone]): At least 1 percent but less than 20 percent impaired, limited or restricted Mobility: Walking and Moving Around Discharge Status [phone]): At least 1 percent but less than 20 percent impaired, limited or restricted  [name]; Annett Fabian MPT, ATC 05/25/2013, 6:58 PM

## 2013-05-27 ENCOUNTER — Ambulatory Visit (HOSPITAL_COMMUNITY): Payer: Medicare Other

## 2013-06-01 ENCOUNTER — Ambulatory Visit (HOSPITAL_COMMUNITY): Payer: Medicare Other | Admitting: Physical Therapy

## 2013-06-03 ENCOUNTER — Ambulatory Visit (HOSPITAL_COMMUNITY): Payer: Medicare Other | Admitting: Physical Therapy

## 2013-06-14 ENCOUNTER — Encounter (HOSPITAL_COMMUNITY): Payer: Self-pay

## 2013-06-14 ENCOUNTER — Encounter (HOSPITAL_COMMUNITY): Payer: Medicare Other | Attending: Internal Medicine

## 2013-06-14 ENCOUNTER — Encounter (HOSPITAL_COMMUNITY): Payer: Medicare Other

## 2013-06-14 VITALS — BP 139/69 | HR 68 | Temp 97.9°F | Resp 18 | Wt 197.3 lb

## 2013-06-14 DIAGNOSIS — C8590 Non-Hodgkin lymphoma, unspecified, unspecified site: Secondary | ICD-10-CM

## 2013-06-14 DIAGNOSIS — I998 Other disorder of circulatory system: Secondary | ICD-10-CM | POA: Diagnosis not present

## 2013-06-14 DIAGNOSIS — C8589 Other specified types of non-Hodgkin lymphoma, extranodal and solid organ sites: Secondary | ICD-10-CM | POA: Diagnosis not present

## 2013-06-14 DIAGNOSIS — I878 Other specified disorders of veins: Secondary | ICD-10-CM

## 2013-06-14 LAB — LACTATE DEHYDROGENASE: LDH: 162 U/L (ref 94–250)

## 2013-06-14 LAB — CBC WITH DIFFERENTIAL/PLATELET
Basophils Absolute: 0 10*3/uL (ref 0.0–0.1)
Eosinophils Relative: 3 % (ref 0–5)
HCT: 34.3 % — ABNORMAL LOW (ref 39.0–52.0)
Lymphocytes Relative: 11 % — ABNORMAL LOW (ref 12–46)
MCV: 85.3 fL (ref 78.0–100.0)
Monocytes Absolute: 0.4 10*3/uL (ref 0.1–1.0)
RDW: 15.4 % (ref 11.5–15.5)
WBC: 6.1 10*3/uL (ref 4.0–10.5)

## 2013-06-14 LAB — COMPREHENSIVE METABOLIC PANEL
AST: 15 U/L (ref 0–37)
CO2: 28 mEq/L (ref 19–32)
Calcium: 9.5 mg/dL (ref 8.4–10.5)
Creatinine, Ser: 1.35 mg/dL (ref 0.50–1.35)
GFR calc Af Amer: 52 mL/min — ABNORMAL LOW (ref >90)
GFR calc non Af Amer: 45 mL/min — ABNORMAL LOW (ref >90)
Glucose, Bld: 99 mg/dL (ref 70–99)

## 2013-06-14 MED ORDER — HEPARIN SOD (PORK) LOCK FLUSH 100 UNIT/ML IV SOLN
500.0000 [IU] | Freq: Once | INTRAVENOUS | Status: AC
  Administered 2013-06-14: 500 [IU] via INTRAVENOUS
  Filled 2013-06-14: qty 5

## 2013-06-14 MED ORDER — SODIUM CHLORIDE 0.9 % IJ SOLN
10.0000 mL | INTRAMUSCULAR | Status: AC | PRN
  Administered 2013-06-14: 10 mL via INTRAVENOUS
  Filled 2013-06-14: qty 10

## 2013-06-14 MED ORDER — HEPARIN SOD (PORK) LOCK FLUSH 100 UNIT/ML IV SOLN
INTRAVENOUS | Status: AC
  Filled 2013-06-14: qty 5

## 2013-06-14 NOTE — Patient Instructions (Addendum)
[facility] Discharge Instructions  RECOMMENDATIONS MADE BY THE CONSULTANT AND ANY TEST RESULTS WILL BE SENT TO YOUR REFERRING PHYSICIAN.  EXAM FINDINGS BY THE PHYSICIAN TODAY AND SIGNS OR SYMPTOMS TO REPORT TO CLINIC OR PRIMARY PHYSICIAN: Exam and discussion by Dr. Sherrlyn Hock. You are doing well.  MEDICATIONS PRESCRIBED:  none  INSTRUCTIONS GIVEN AND DISCUSSED: Report fevers, night sweats, recurring infections, unexplained weight loss, etc.  SPECIAL INSTRUCTIONS/FOLLOW-UP: Port flush every 6 weeks and to be seen in follow-up in 6 months.  Thank you for choosing [facility] to provide your oncology and hematology care.  To afford each patient quality time with our providers, please arrive at least 15 minutes before your scheduled appointment time.  With your help, our goal is to use those 15 minutes to complete the necessary work-up to ensure our physicians have the information they need to help with your evaluation and healthcare recommendations.    Effective January 1st, 2014, we ask that you re-schedule your appointment with our physicians should you arrive 10 or more minutes late for your appointment.  We strive to give you quality time with our providers, and arriving late affects you and other patients whose appointments are after yours.    Again, thank you for choosing [facility].  Our hope is that these requests will decrease the amount of time that you wait before being seen by our physicians.       _____________________________________________________________  Should you have questions after your visit to [facility], please contact our office at [phone] between the hours of 8:30 a.m. and 5:00 p.m.  Voicemails left after 4:30 p.m. will not be returned until the following business day.  For prescription refill requests, have your pharmacy contact our office with your prescription refill request.

## 2013-06-14 NOTE — Progress Notes (Signed)
Duane Ruths, MD [address] [place] [address]  CHIEF COMPLAINT/DIAGNOSIS:  1.  Diffuse large B-cell lymphoma presented with hypercalcemia and extensive adenopathy within the chest and abdomen. He presented with biopsy-proven disease on 12/04/2006 and treated with R-CHOP chemotherapy x 6 cycles with complete remission.  2.  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 On Synthroid replacement 3.  History of colon cancer in 1993 status post resection  CURRENT THERAPY: on surveillance  INTERVAL HISTORY / HPI: Duane Castaneda 79 y.o. male returns for continued oncology followup, he was seen by DuaneNeijstrom about 6 months ago, he also comes for port flush once every 6 weeks in between visits. States that he had right knee replacement done a few months ago and is doing well since surgery. He ambulates slowly given age and medical problems but tries to remain active. Denies fevers or night sweats. Appetite is good, denies unintentional weight loss. No new bone pains. Denies any new cough, chest pain or hemoptysis. Denies any abdominal pain about disturbances. Denies feeling any new lymph node masses on self exam.   Past Medical History  Diagnosis Date  . History of colon cancer     Status post resection in 1993  . Essential hypertension, benign   . Degenerative joint disease   . Port catheter in place 08/21/2012  . Hypothyroidism   . Cataracts, bilateral   . Detached retina   . GERD (gastroesophageal reflux disease)   . Lymphoma     Diffuse large B cell status post R CHOP - Duane Castaneda, Remission since Dec 2007  . Follicular thyroid carcinoma     Duane Castaneda    has OA (osteoarthritis) of knee; Port catheter in place; Preoperative evaluation to rule out surgical contraindication; Essential hypertension, benign; and S/P total knee replacement on his problem list.     is  allergic to tramadol.  Duane Castaneda had no medications administered during this visit.  Past Surgical History  Procedure Laterality Date  . Det[facility] retina left eye  [facility]  . Bilateral cataract surgery  1988 Left, 1990 Right    [facility]  . Colon cancer resection  1993    Duane Castaneda  . Lymphoma surgery  2007    Duane Castaneda  . Thyroid cancer surgery  2009    Duane Castaneda  . Transurethral resection of prostate  2011    Duane Castaneda  . Left knee replacement  2004    Duane Castaneda  . Hernia repair Bilateral 2000-Left, 2005-Right    Duane Castaneda  . Total knee arthroplasty Right 02/08/2013    Procedure: TOTAL KNEE ARTHROPLASTY;  Surgeon: Duane Hearing, MD;  Location: [facility];  Service: Orthopedics;  Laterality: Right;    ROS:  Denies any headaches, dizziness, fevers, chills, night sweats, nausea, vomiting, diarrhea, constipation, chest pain, heart palpitations, shortness of breath at rest, blood in stool, black tarry stool, urinary pain, urinary burning, urinary frequency, hematuria, new paresthesias in extremities, polyuria or polydipsia.   PHYSICAL EXAMINATION  ECOG PERFORMANCE STATUS: 1  Filed Vitals:   06/14/13 1400  BP: 139/69  Pulse: 68  Temp: 97.9 F (36.6 C)  Resp: 18    GENERAL: Patient is alert and oriented, well-nourished individual, no acute distress. No icterus. No pallor. HEENT: EOMs intact. No oral thrush. No cervical adenopathy or other neck masses palpable. CVS:  S1-S2, regular rate and rhythm LUNGS: Bilaterally good air entry, no crepitations or rhonchi ABDOMEN: Soft, obese, nontender, no hepatosplenomegaly clinically EXTREMITIES: Mild bilateral pedal edema LYMPHATICS: No adenopathy in the axilla the inguinal areas  LABORATORY DATA: CBC, Cr, LFT, LDH will be sent today.  ASSESSMENT / PLAN:  1.  Diffuse large B-cell lymphoma diagnosed December 2007 and treated with R-CHOP chemotherapy x 6 cycles with complete  remission. Patient continues to do well, no clinical evidence to suggest recurrent lymphoma. No hepatosplenomegaly or lymphadenopathy on exam today. No B symptoms. Plan is to continue surveillance. He will be scheduled for Port flush q6 weekly and next MD followup at 24 weeks with labs including CBC, Cr, LFT, LDH.  2.  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 On Synthroid replacement, patient states that this is being monitored by primary MD. 3.  History of colon cancer in 1993 status post resection. No new bowel disturbances. In between visits, patient was advised to call or come to ER in case of any new fevers, night sweats, unintentional weight loss, lymph node masses felt on self-exam or acute sickness. He is agreeable to this plan.   Duane Castaneda

## 2013-06-22 ENCOUNTER — Institutional Professional Consult (permissible substitution) (INDEPENDENT_AMBULATORY_CARE_PROVIDER_SITE_OTHER): Payer: Medicare Other | Admitting: Thoracic Surgery (Cardiothoracic Vascular Surgery)

## 2013-06-22 ENCOUNTER — Other Ambulatory Visit: Payer: Self-pay | Admitting: *Deleted

## 2013-06-22 ENCOUNTER — Encounter: Payer: Self-pay | Admitting: Thoracic Surgery (Cardiothoracic Vascular Surgery)

## 2013-06-22 VITALS — BP 146/77 | HR 78 | Resp 20 | Ht 70.0 in | Wt 198.0 lb

## 2013-06-22 DIAGNOSIS — C8590 Non-Hodgkin lymphoma, unspecified, unspecified site: Secondary | ICD-10-CM

## 2013-06-22 DIAGNOSIS — Z9889 Other specified postprocedural states: Secondary | ICD-10-CM | POA: Diagnosis not present

## 2013-06-22 DIAGNOSIS — Z95828 Presence of other vascular implants and grafts: Secondary | ICD-10-CM

## 2013-06-22 DIAGNOSIS — C8589 Other specified types of non-Hodgkin lymphoma, extranodal and solid organ sites: Secondary | ICD-10-CM | POA: Diagnosis not present

## 2013-06-22 DIAGNOSIS — C8510 Unspecified B-cell lymphoma, unspecified site: Secondary | ICD-10-CM

## 2013-06-22 NOTE — Progress Notes (Signed)
PCP is MCGOUGH,WILLIAM M, MD Referring Provider is Castaneda, Duane S, PA-C  Chief Complaint  Patient presents with  . Follow-up    discuss Port-A-Cath removal     HPI: Duane Castaneda is an 79-year-old gentleman who presents with chief complaint of a Port-A-Cath no longer functioning.  Duane Castaneda is an 89-year-old gentleman who had a Port-A-Cath placed many years ago by Dr. Patrick Burney for IV access for chemotherapy for a B-cell lymphoma. He is now been in remission for 6 years from that and no longer needs a Port-A-Cath. It had been left in place and was being flushed. According to his daughter they're no longer able to withdraw blood from the port. He wishes to have it removed.     Past Medical History  Diagnosis Date  . History of colon cancer     Status post resection in 1993  . Essential hypertension, benign   . Degenerative joint disease   . Port catheter in place 08/21/2012  . Hypothyroidism   . Cataracts, bilateral   . Detached retina   . GERD (gastroesophageal reflux disease)   . Lymphoma     Diffuse large B cell status post R CHOP - Dr. Neijstrom, Remission since Dec 2007  . Follicular thyroid carcinoma     Dr. Gerkin    Past Surgical History  Procedure Laterality Date  . Detached retina left eye  1983    [facility]  . Bilateral cataract surgery  1988 Left, 1990 Right    [facility]  . Colon cancer resection  1993    Dr. LeRoy Smith[facility]  . Lymphoma surgery  2007    Dr. Neijstrom-[facility]  . Thyroid cancer surgery  2009    Dr. Gerkin[place]  . Transurethral resection of prostate  2011    Dr. Javaid-[facility]  . Left knee replacement  2004    Dr. Plomaritis  . Hernia repair Bilateral 2000-Left, 2005-Right    Dr. LeRoy Smith[facility]  . Total knee arthroplasty Right 02/08/2013    Procedure: TOTAL KNEE ARTHROPLASTY;  Surgeon: Stanley E Harrison, MD;  Location: [facility];  Service: Orthopedics;  Laterality: Right;    Family History  Problem Relation Age of Onset  . Arthritis     . Lung disease    . Leukemia Mother   . Lung cancer Father   . Cancer Sister   . Cancer Brother     Social History History  Substance Use Topics  . Smoking status: Former Smoker -- 1.00 packs/day for 15 years    Types: Cigarettes  . Smokeless tobacco: Never Used  . Alcohol Use: No    Current Outpatient Prescriptions  Medication Sig Dispense Refill  . allopurinol (ZYLOPRIM) 300 MG tablet Take 300 mg by mouth daily.        . aspirin 81 MG tablet Take 81 mg by mouth daily.      . bisoprolol-hydrochlorothiazide (ZIAC) 2.5-6.25 MG per tablet Take 1 tablet by mouth every other day.       . Docusate Calcium (STOOL SOFTENER PO) Take 1 capsule by mouth as needed.      . levothyroxine (SYNTHROID, LEVOTHROID) 150 MCG tablet Take 150 mcg by mouth daily.        . lisinopril (PRINIVIL,ZESTRIL) 10 MG tablet Take 10 mg by mouth daily.       . Multiple Vitamin (MULTIVITAMIN) capsule Take 1 capsule by mouth daily.        . omeprazole (PRILOSEC) 20 MG capsule Take 20 mg by   mouth daily.       . terazosin (HYTRIN) 10 MG capsule Take 10 mg by mouth at bedtime.        No current facility-administered medications for this visit.   Facility-Administered Medications Ordered in Other Visits  Medication Dose Route Frequency Provider Last Rate Last Dose  . sodium chloride 0.9 % injection 10 mL  10 mL Intravenous PRN Duane S Kefalas, PA-C   10 mL at 06/14/13 1445    Allergies  Allergen Reactions  . Tramadol Other (See Comments)    halucinations    Review of Systems  Constitutional: Negative for fever and chills.  Respiratory: Negative for shortness of breath.   Cardiovascular: Negative for chest pain.  Skin: Positive for rash (psoriasis).  All other systems reviewed and are negative.    BP 146/77  Pulse 78  Resp 20  Ht 5' 10" (1.778 m)  Wt 198 lb (89.812 kg)  BMI 28.41 kg/m2  SpO2 94% Physical Exam  Vitals reviewed. Constitutional: He is oriented to person, place, and time. He appears  well-developed and well-nourished. No distress.  HENT:  Head: Normocephalic and atraumatic.  Eyes: Pupils are equal, round, and reactive to light.  Neck: Neck supple. No thyromegaly present.  Cardiovascular: Normal rate, regular rhythm and normal heart sounds.   No murmur heard. Pulmonary/Chest: Effort normal and breath sounds normal.  Abdominal: Soft. There is no tenderness.  Musculoskeletal: He exhibits no edema.  Lymphadenopathy:    He has no cervical adenopathy.  Neurological: He is alert and oriented to person, place, and time. No cranial nerve deficit.  Skin: Skin is warm and dry.     Diagnostic Tests: None  Impression: 89-year-old gentleman with a Port-A-Cath in place who no longer needs the intravenous access and wishes to have it removed..  I described the procedure to the patient and his daughter. They understand the general nature of the procedure, the use of local anesthetic and gentle IV sedation. They understand the outpatient nature the procedure. We discussed the indications, risks, benefits, and alternatives. They understand the risk of the procedure include reaction to medication, bleeding, catheter breakage with retained fragments, and wound infection. He accepts the risks and wishes to proceed.  Plan: Port-A-Cath removal on Monday, July 21. 

## 2013-06-23 ENCOUNTER — Encounter (HOSPITAL_COMMUNITY): Payer: Self-pay | Admitting: Pharmacy Technician

## 2013-06-25 ENCOUNTER — Ambulatory Visit (HOSPITAL_COMMUNITY)
Admission: RE | Admit: 2013-06-25 | Discharge: 2013-06-25 | Disposition: A | Discharge status: Home / Self Care | Payer: Medicare Other | Source: Ambulatory Visit | Attending: Thoracic Surgery (Cardiothoracic Vascular Surgery) | Admitting: Thoracic Surgery (Cardiothoracic Vascular Surgery)

## 2013-06-25 ENCOUNTER — Encounter (HOSPITAL_COMMUNITY)
Admission: RE | Admit: 2013-06-25 | Discharge: 2013-06-25 | Disposition: A | Discharge status: Home / Self Care | Payer: Medicare Other | Source: Ambulatory Visit | Attending: Thoracic Surgery (Cardiothoracic Vascular Surgery) | Admitting: Thoracic Surgery (Cardiothoracic Vascular Surgery)

## 2013-06-25 ENCOUNTER — Encounter (HOSPITAL_COMMUNITY): Payer: Self-pay

## 2013-06-25 VITALS — BP 177/83 | HR 73 | Temp 98.7°F | Resp 18 | Ht 70.0 in | Wt 197.3 lb

## 2013-06-25 DIAGNOSIS — C8510 Unspecified B-cell lymphoma, unspecified site: Secondary | ICD-10-CM

## 2013-06-25 DIAGNOSIS — C8589 Other specified types of non-Hodgkin lymphoma, extranodal and solid organ sites: Secondary | ICD-10-CM | POA: Diagnosis not present

## 2013-06-25 DIAGNOSIS — Z01818 Encounter for other preprocedural examination: Secondary | ICD-10-CM | POA: Diagnosis not present

## 2013-06-25 DIAGNOSIS — Z01812 Encounter for preprocedural laboratory examination: Secondary | ICD-10-CM | POA: Diagnosis not present

## 2013-06-25 LAB — COMPREHENSIVE METABOLIC PANEL
Albumin: 3.7 g/dL (ref 3.5–5.2)
BUN: 20 mg/dL (ref 6–23)
Chloride: 101 mEq/L (ref 96–112)
Creatinine, Ser: 1.31 mg/dL (ref 0.50–1.35)
Total Bilirubin: 0.4 mg/dL (ref 0.3–1.2)
Total Protein: 6.4 g/dL (ref 6.0–8.3)

## 2013-06-25 LAB — CBC
HCT: 35 % — ABNORMAL LOW (ref 39.0–52.0)
MCH: 27.7 pg (ref 26.0–34.0)
MCHC: 32.6 g/dL (ref 30.0–36.0)
MCV: 85.2 fL (ref 78.0–100.0)
RDW: 15.7 % — ABNORMAL HIGH (ref 11.5–15.5)

## 2013-06-25 LAB — PROTIME-INR
INR: 1.02 (ref 0.00–1.49)
Prothrombin Time: 13.2 seconds (ref 11.6–15.2)

## 2013-06-25 NOTE — Progress Notes (Signed)
I left a message on patient's voice mail with instructions to arrive at 0830 instead of 0530 on  Monday.

## 2013-06-25 NOTE — Pre-Procedure Instructions (Signed)
Duane Castaneda  06/25/2013   Your procedure is scheduled on: Monday, July 21st.  Report to [facility] at 5:30AM.  Call this number if you have problems the morning of surgery: [phone]   Remember:   Do not eat food or drink liquids after midnight.   Take these medicines the morning of surgery with A SIP OF WATER: bisoprolol-hydrochlorothiazide (ZIAC),levothyroxine (SYNTHROID, LEVOTHROID), omeprazole (PRILOSEC).   Do not wear jewelry, make-up or nail polish.  Do not wear lotions, powders, or perfumes. You may wear deodorant.             Men may shave face and neck.  Do not bring valuables to the hospital.  [facility] is not responsible for any belongings or valuables.  Contacts, dentures or bridgework may not be worn into surgery.  Leave suitcase in the car. After surgery it may be brought to your room.  For patients admitted to the hospital, checkout time is 11:00 AM the day of discharge.   Patients discharged the day of surgery will not be allowed to drive home.  Name and phone number of your driver: -   Special Instructions: Shower using CHG 2 nights before surgery and the night before surgery.  If you shower the day of surgery use CHG.  Use special wash - you have one bottle of CHG for all showers.  You should use approximately 1/3 of the bottle for each shower.   Please read over the following fact sheets that you were given: Pain Booklet, Coughing and Deep Breathing and Surgical Site Infection Prevention

## 2013-06-27 MED ORDER — DEXTROSE 5 % IV SOLN
1.5000 g | INTRAVENOUS | Status: AC
  Administered 2013-06-28: 1.5 g via INTRAVENOUS
  Filled 2013-06-27: qty 1.5

## 2013-06-28 ENCOUNTER — Encounter (HOSPITAL_COMMUNITY): Payer: Self-pay | Admitting: Anesthesiology

## 2013-06-28 ENCOUNTER — Ambulatory Visit (HOSPITAL_COMMUNITY)
Admission: RE | Admit: 2013-06-28 | Discharge: 2013-06-28 | Disposition: A | Discharge status: Home / Self Care | Payer: Medicare Other | Source: Ambulatory Visit | Attending: Thoracic Surgery (Cardiothoracic Vascular Surgery) | Admitting: Thoracic Surgery (Cardiothoracic Vascular Surgery)

## 2013-06-28 ENCOUNTER — Ambulatory Visit (HOSPITAL_COMMUNITY): Payer: Medicare Other | Admitting: Anesthesiology

## 2013-06-28 ENCOUNTER — Encounter (HOSPITAL_COMMUNITY)
Admission: RE | Disposition: A | Discharge status: Home / Self Care | Payer: Self-pay | Source: Ambulatory Visit | Attending: Thoracic Surgery (Cardiothoracic Vascular Surgery)

## 2013-06-28 ENCOUNTER — Encounter (HOSPITAL_COMMUNITY): Payer: Self-pay | Admitting: *Deleted

## 2013-06-28 DIAGNOSIS — Z96659 Presence of unspecified artificial knee joint: Secondary | ICD-10-CM | POA: Diagnosis not present

## 2013-06-28 DIAGNOSIS — E039 Hypothyroidism, unspecified: Secondary | ICD-10-CM | POA: Insufficient documentation

## 2013-06-28 DIAGNOSIS — I10 Essential (primary) hypertension: Secondary | ICD-10-CM | POA: Insufficient documentation

## 2013-06-28 DIAGNOSIS — Z85038 Personal history of other malignant neoplasm of large intestine: Secondary | ICD-10-CM | POA: Insufficient documentation

## 2013-06-28 DIAGNOSIS — Z7982 Long term (current) use of aspirin: Secondary | ICD-10-CM | POA: Diagnosis not present

## 2013-06-28 DIAGNOSIS — Z79899 Other long term (current) drug therapy: Secondary | ICD-10-CM | POA: Diagnosis not present

## 2013-06-28 DIAGNOSIS — K219 Gastro-esophageal reflux disease without esophagitis: Secondary | ICD-10-CM | POA: Insufficient documentation

## 2013-06-28 DIAGNOSIS — M1990 Unspecified osteoarthritis, unspecified site: Secondary | ICD-10-CM | POA: Insufficient documentation

## 2013-06-28 DIAGNOSIS — Z9221 Personal history of antineoplastic chemotherapy: Secondary | ICD-10-CM | POA: Diagnosis not present

## 2013-06-28 DIAGNOSIS — C8510 Unspecified B-cell lymphoma, unspecified site: Secondary | ICD-10-CM

## 2013-06-28 DIAGNOSIS — Z87891 Personal history of nicotine dependence: Secondary | ICD-10-CM | POA: Diagnosis not present

## 2013-06-28 DIAGNOSIS — Z886 Allergy status to analgesic agent status: Secondary | ICD-10-CM | POA: Diagnosis not present

## 2013-06-28 DIAGNOSIS — Z85850 Personal history of malignant neoplasm of thyroid: Secondary | ICD-10-CM | POA: Insufficient documentation

## 2013-06-28 DIAGNOSIS — Z452 Encounter for adjustment and management of vascular access device: Secondary | ICD-10-CM | POA: Insufficient documentation

## 2013-06-28 DIAGNOSIS — C8589 Other specified types of non-Hodgkin lymphoma, extranodal and solid organ sites: Secondary | ICD-10-CM | POA: Insufficient documentation

## 2013-06-28 DIAGNOSIS — C8582 Other specified types of non-Hodgkin lymphoma, intrathoracic lymph nodes: Secondary | ICD-10-CM | POA: Diagnosis not present

## 2013-06-28 HISTORY — PX: PORT-A-CATH REMOVAL: SHX5289

## 2013-06-28 SURGERY — REMOVAL PORT-A-CATH
Anesthesia: Monitor Anesthesia Care | Site: Chest | Laterality: Left | Wound class: Clean

## 2013-06-28 MED ORDER — LIDOCAINE-EPINEPHRINE (PF) 1 %-1:200000 IJ SOLN
INTRAMUSCULAR | Status: DC | PRN
  Administered 2013-06-28: 3 mL via INTRADERMAL

## 2013-06-28 MED ORDER — HYDROCODONE-ACETAMINOPHEN 5-325 MG PO TABS: 1.0000 | ORAL_TABLET | Freq: Four times a day (QID) | ORAL | Status: DC | PRN

## 2013-06-28 MED ORDER — LACTATED RINGERS IV SOLN
INTRAVENOUS | Status: DC
  Administered 2013-06-28: 09:00:00 via INTRAVENOUS

## 2013-06-28 MED ORDER — PROPOFOL 10 MG/ML IV BOLUS
INTRAVENOUS | Status: DC | PRN
  Administered 2013-06-28: 30 mg via INTRAVENOUS
  Administered 2013-06-28: 20 mg via INTRAVENOUS

## 2013-06-28 MED ORDER — SODIUM CHLORIDE 0.9 % IJ SOLN: 3.0000 mL | Freq: Two times a day (BID) | INTRAMUSCULAR | Status: DC

## 2013-06-28 MED ORDER — SODIUM CHLORIDE 0.9 % IV SOLN: 250.0000 mL | INTRAVENOUS | Status: DC | PRN

## 2013-06-28 MED ORDER — SODIUM CHLORIDE 0.9 % IJ SOLN: 3.0000 mL | INTRAMUSCULAR | Status: DC | PRN

## 2013-06-28 MED ORDER — ACETAMINOPHEN 650 MG RE SUPP: 650.0000 mg | RECTAL | Status: DC | PRN

## 2013-06-28 MED ORDER — ONDANSETRON HCL 4 MG/2ML IJ SOLN
INTRAMUSCULAR | Status: DC | PRN
  Administered 2013-06-28: 4 mg via INTRAVENOUS

## 2013-06-28 MED ORDER — 0.9 % SODIUM CHLORIDE (POUR BTL) OPTIME
TOPICAL | Status: DC | PRN
  Administered 2013-06-28: 1000 mL

## 2013-06-28 MED ORDER — LIDOCAINE-EPINEPHRINE (PF) 1 %-1:200000 IJ SOLN
INTRAMUSCULAR | Status: AC
  Filled 2013-06-28: qty 10

## 2013-06-28 MED ORDER — LIDOCAINE HCL (CARDIAC) 20 MG/ML IV SOLN
INTRAVENOUS | Status: DC | PRN
  Administered 2013-06-28: 40 mg via INTRAVENOUS

## 2013-06-28 MED ORDER — ACETAMINOPHEN 325 MG PO TABS: 650.0000 mg | ORAL_TABLET | ORAL | Status: DC | PRN

## 2013-06-28 MED ORDER — FENTANYL CITRATE 0.05 MG/ML IJ SOLN: 25.0000 ug | INTRAMUSCULAR | Status: DC | PRN

## 2013-06-28 MED ORDER — ONDANSETRON HCL 4 MG/2ML IJ SOLN: 4.0000 mg | Freq: Four times a day (QID) | INTRAMUSCULAR | Status: DC | PRN

## 2013-06-28 MED ORDER — FENTANYL CITRATE 0.05 MG/ML IJ SOLN
INTRAMUSCULAR | Status: DC | PRN
  Administered 2013-06-28: 25 ug via INTRAVENOUS

## 2013-06-28 MED ORDER — ONDANSETRON HCL 4 MG/2ML IJ SOLN: 4.0000 mg | Freq: Once | INTRAMUSCULAR | Status: DC | PRN

## 2013-06-28 SURGICAL SUPPLY — 26 items
BLADE SURG 11 STRL SS (BLADE) ×2 IMPLANT
CANISTER SUCTION 2500CC (MISCELLANEOUS) ×2 IMPLANT
CLOTH BEACON ORANGE TIMEOUT ST (SAFETY) ×2 IMPLANT
COVER SURGICAL LIGHT HANDLE (MISCELLANEOUS) ×4 IMPLANT
DERMABOND ADVANCED (GAUZE/BANDAGES/DRESSINGS) ×1
DERMABOND ADVANCED .7 DNX12 (GAUZE/BANDAGES/DRESSINGS) ×1 IMPLANT
DRAPE LAPAROTOMY T 102X78X121 (DRAPES) ×2 IMPLANT
ELECT REM PT RETURN 9FT ADLT (ELECTROSURGICAL) ×2
ELECTRODE REM PT RTRN 9FT ADLT (ELECTROSURGICAL) ×1 IMPLANT
GLOVE EUDERMIC 7 POWDERFREE (GLOVE) ×2 IMPLANT
GOWN PREVENTION PLUS XLARGE (GOWN DISPOSABLE) ×2 IMPLANT
GOWN STRL NON-REIN LRG LVL3 (GOWN DISPOSABLE) ×2 IMPLANT
KIT BASIN OR (CUSTOM PROCEDURE TRAY) ×2 IMPLANT
KIT ROOM TURNOVER OR (KITS) ×2 IMPLANT
NEEDLE 22X1 1/2 (OR ONLY) (NEEDLE) ×2 IMPLANT
NS IRRIG 1000ML POUR BTL (IV SOLUTION) ×2 IMPLANT
PACK GENERAL/GYN (CUSTOM PROCEDURE TRAY) ×2 IMPLANT
PAD ARMBOARD 7.5X6 YLW CONV (MISCELLANEOUS) ×4 IMPLANT
SPONGE GAUZE 4X4 12PLY (GAUZE/BANDAGES/DRESSINGS) ×2 IMPLANT
SUT VIC AB 3-0 SH 27 (SUTURE) ×1
SUT VIC AB 3-0 SH 27X BRD (SUTURE) ×1 IMPLANT
SUT VICRYL 4-0 PS2 18IN ABS (SUTURE) ×2 IMPLANT
SYR CONTROL 10ML LL (SYRINGE) ×2 IMPLANT
TOWEL OR 17X24 6PK STRL BLUE (TOWEL DISPOSABLE) ×2 IMPLANT
TOWEL OR 17X26 10 PK STRL BLUE (TOWEL DISPOSABLE) ×2 IMPLANT
WATER STERILE IRR 1000ML POUR (IV SOLUTION) ×2 IMPLANT

## 2013-06-28 NOTE — Anesthesia Postprocedure Evaluation (Signed)
Anesthesia Post Note  Patient: Duane Castaneda  Procedure(s) Performed: Procedure(s) (LRB): REMOVAL PORT-A-CATH (Left)  Anesthesia type: MAC  Patient location: PACU  Post pain: Pain level controlled and Adequate analgesia  Post assessment: Post-op Vital signs reviewed, Patient's Cardiovascular Status Stable and Respiratory Function Stable  Last Vitals:  Filed Vitals:   06/28/13 1235  BP: 158/56  Pulse:   Temp: 36.6 C  Resp:     Post vital signs: Reviewed and stable  Level of consciousness: awake, alert  and oriented  Complications: No apparent anesthesia complications

## 2013-06-28 NOTE — H&P (View-Only) (Signed)
PCP is [name], MD Referring Provider is Jacalyn Lefevre Maurine Minister, PA-C  Chief Complaint  Patient presents with  . Follow-up    discuss Port-A-Cath removal     HPI: Duane Castaneda is an 79 year old gentleman who presents with chief complaint of a Port-A-Cath no longer functioning.  Duane Castaneda is an 79 year old gentleman who had a Port-A-Cath placed many years ago by Dr. Karle Plumber for IV access for chemotherapy for a B-cell lymphoma. He is now been in remission for 6 years from that and no longer needs a Port-A-Cath. It had been left in place and was being flushed. According to his daughter they're no longer able to withdraw blood from the port. He wishes to have it removed.     Past Medical History  Diagnosis Date  . History of colon cancer     Status post resection in 1993  . Essential hypertension, benign   . Degenerative joint disease   . Port catheter in place 08/21/2012  . Hypothyroidism   . Cataracts, bilateral   . Detached retina   . GERD (gastroesophageal reflux disease)   . Lymphoma     Diffuse large B cell status post R [facility] - Dr. Mariel Sleet, Remission since Dec 2007  . Follicular thyroid carcinoma     Dr. Gerrit Friends    Past Surgical History  Procedure Laterality Date  . Detached retina left eye  1983    [facility]  . Bilateral cataract surgery  1988 Left, 1990 Right    [facility]  . Colon cancer resection  1993    Dr. Ulice Bold  . Lymphoma surgery  2007    Dr. Tally Joe  . Thyroid cancer surgery  2009    Dr. Danie Binder  . Transurethral resection of prostate  2011    Dr. Karl Bales  . Left knee replacement  2004    Dr. Arletha Grippe  . Hernia repair Bilateral 2000-Left, 2005-Right    Dr. Ulice Bold  . Total knee arthroplasty Right 02/08/2013    Procedure: TOTAL KNEE ARTHROPLASTY;  Surgeon: Vickki Hearing, MD;  Location: [facility];  Service: Orthopedics;  Laterality: Right;    Family History  Problem Relation Age of Onset  . Arthritis     . Lung disease    . Leukemia Mother   . Lung cancer Father   . Cancer Sister   . Cancer Brother     Social History History  Substance Use Topics  . Smoking status: Former Smoker -- 1.00 packs/day for 15 years    Types: Cigarettes  . Smokeless tobacco: Never Used  . Alcohol Use: No    Current Outpatient Prescriptions  Medication Sig Dispense Refill  . allopurinol (ZYLOPRIM) 300 MG tablet Take 300 mg by mouth daily.        Marland Kitchen aspirin 81 MG tablet Take 81 mg by mouth daily.      . bisoprolol-hydrochlorothiazide (ZIAC) 2.5-6.25 MG per tablet Take 1 tablet by mouth every other day.       Tery Sanfilippo Calcium (STOOL SOFTENER PO) Take 1 capsule by mouth as needed.      Marland Kitchen levothyroxine (SYNTHROID, LEVOTHROID) 150 MCG tablet Take 150 mcg by mouth daily.        Marland Kitchen lisinopril (PRINIVIL,ZESTRIL) 10 MG tablet Take 10 mg by mouth daily.       . Multiple Vitamin (MULTIVITAMIN) capsule Take 1 capsule by mouth daily.        Marland Kitchen omeprazole (PRILOSEC) 20 MG capsule Take 20 mg by  mouth daily.       Marland Kitchen terazosin (HYTRIN) 10 MG capsule Take 10 mg by mouth at bedtime.        No current facility-administered medications for this visit.   Facility-Administered Medications Ordered in Other Visits  Medication Dose Route Frequency Provider Last Rate Last Dose  . sodium chloride 0.9 % injection 10 mL  10 mL Intravenous PRN Ellouise Newer, PA-C   10 mL at 06/14/13 1445    Allergies  Allergen Reactions  . Tramadol Other (See Comments)    halucinations    Review of Systems  Constitutional: Negative for fever and chills.  Respiratory: Negative for shortness of breath.   Cardiovascular: Negative for chest pain.  Skin: Positive for rash (psoriasis).  All other systems reviewed and are negative.    BP 146/77  Pulse 78  Resp 20  Ht 5\' 10"  (1.778 m)  Wt 198 lb (89.812 kg)  BMI 28.41 kg/m2  SpO2 94% Physical Exam  Vitals reviewed. Constitutional: He is oriented to person, place, and time. He appears  well-developed and well-nourished. No distress.  HENT:  Head: Normocephalic and atraumatic.  Eyes: Pupils are equal, round, and reactive to light.  Neck: Neck supple. No thyromegaly present.  Cardiovascular: Normal rate, regular rhythm and normal heart sounds.   No murmur heard. Pulmonary/Chest: Effort normal and breath sounds normal.  Abdominal: Soft. There is no tenderness.  Musculoskeletal: He exhibits no edema.  Lymphadenopathy:    He has no cervical adenopathy.  Neurological: He is alert and oriented to person, place, and time. No cranial nerve deficit.  Skin: Skin is warm and dry.     Diagnostic Tests: None  Impression: 79 year old gentleman with a Port-A-Cath in place who no longer needs the intravenous access and wishes to have it removed..  I described the procedure to the patient and his daughter. They understand the general nature of the procedure, the use of local anesthetic and gentle IV sedation. They understand the outpatient nature the procedure. We discussed the indications, risks, benefits, and alternatives. They understand the risk of the procedure include reaction to medication, bleeding, catheter breakage with retained fragments, and wound infection. He accepts the risks and wishes to proceed.  Plan: Port-A-Cath removal on Monday, July 21.

## 2013-06-28 NOTE — Anesthesia Preprocedure Evaluation (Signed)
Anesthesia Evaluation  Patient identified by MRN, date of birth, ID band Patient awake    Reviewed: Allergy & Precautions, H&P , NPO status , Patient's Chart, lab work & pertinent test results  Airway Mallampati: II  Neck ROM: full    Dental   Pulmonary former smoker,          Cardiovascular hypertension,     Neuro/Psych    GI/Hepatic GERD-  ,H/o colon CA   Endo/Other  Hypothyroidism   Renal/GU      Musculoskeletal   Abdominal   Peds  Hematology Lymphoma now in remission   Anesthesia Other Findings   Reproductive/Obstetrics                           Anesthesia Physical Anesthesia Plan  ASA: III  Anesthesia Plan: MAC   Post-op Pain Management:    Induction: Intravenous  Airway Management Planned: Simple Face Mask  Additional Equipment:   Intra-op Plan:   Post-operative Plan:   Informed Consent: I have reviewed the patients History and Physical, chart, labs and discussed the procedure including the risks, benefits and alternatives for the proposed anesthesia with the patient or authorized representative who has indicated his/her understanding and acceptance.     Plan Discussed with: CRNA, Anesthesiologist and Surgeon  Anesthesia Plan Comments:         Anesthesia Quick Evaluation

## 2013-06-28 NOTE — Transfer of Care (Signed)
Immediate Anesthesia Transfer of Care Note  Patient: Duane Castaneda  Procedure(s) Performed: Procedure(s): REMOVAL PORT-A-CATH (Left)  Patient Location: PACU  Anesthesia Type:MAC  Level of Consciousness: awake, alert  and oriented  Airway & Oxygen Therapy: Patient Spontanous Breathing and Patient connected to nasal cannula oxygen  Post-op Assessment: Report given to PACU RN and Post -op Vital signs reviewed and stable  Post vital signs: Reviewed and stable  Complications: No apparent anesthesia complications

## 2013-06-28 NOTE — Brief Op Note (Signed)
06/28/2013  12:27 PM  PATIENT:  Duane Castaneda  79 y.o. male  PRE-OPERATIVE DIAGNOSIS:  chemotherapy for a B-cell lymphoma; portacath no longer needed  POST-OPERATIVE DIAGNOSIS:  chemotherapy for a B-cell lymphoma; portacath no longer needed  PROCEDURE:  Procedure(s): REMOVAL PORT-A-CATH (Left)  SURGEON:  Surgeon(s) and Role:    * Loreli Slot, MD - Primary  ANESTHESIA:   local and IV sedation  EBL; minimal  LOCAL MEDICATIONS USED:  LIDOCAINE  and Amount: 10 ml  SPECIMEN:  Source of Specimen:  Portacath  DISPOSITION OF SPECIMEN:  N/A  COUNTS:  YES  PLAN OF CARE: Discharge to home after PACU  PATIENT DISPOSITION:  PACU - hemodynamically stable.   Delay start of Pharmacological VTE agent (>24hrs) due to surgical blood loss or risk of bleeding: not applicable

## 2013-06-28 NOTE — Interval H&P Note (Signed)
History and Physical Interval Note:  06/28/2013 11:51 AM  Duane Castaneda  has presented today for surgery, with the diagnosis of chemotherapy for a B-cell lymphoma portacath no longer needed  The various methods of treatment have been discussed with the patient and family. After consideration of risks, benefits and other options for treatment, the patient has consented to  Procedure(s): REMOVAL PORT-A-CATH (Bilateral) as a surgical intervention .  The patient's history has been reviewed, patient examined, no change in status, stable for surgery.  I have reviewed the patient's chart and labs.  Questions were answered to the patient's satisfaction.     Jarmel Linhardt C

## 2013-06-29 ENCOUNTER — Encounter (HOSPITAL_COMMUNITY): Payer: Self-pay | Admitting: Thoracic Surgery (Cardiothoracic Vascular Surgery)

## 2013-06-29 NOTE — Op Note (Signed)
Duane Castaneda, Duane Castaneda             ACCOUNT NO.:  [id number]  MEDICAL RECORD NO.:  [id number]  LOCATION:  [facility]                         FACILITY:  [facility]  PHYSICIAN:  Salvatore Decent. Dorris Fetch, M.D.DATE OF BIRTH:  1923/01/14  DATE OF PROCEDURE: DATE OF DISCHARGE:  06/28/2013                              OPERATIVE REPORT   PREOPERATIVE DIAGNOSIS:  Port-A-Cath no longer needed for IV access.  POSTOPERATIVE DIAGNOSIS:  Port-A-Cath no longer needed for IV access.  PROCEDURE:  Port-A-Cath removal.  SURGEON:  Salvatore Decent. Dorris Fetch, MD  ASSISTANT:  None.  ANESTHESIA:  Local with intravenous sedation.  FINDINGS:  Port-A-Cath removed intact.  CLINICAL NOTE:  [name] is an 79 year old gentleman who had a Port- A-Cath placed several years ago for IV access for chemotherapy for Lymphoma. He is in remission and no longer needs the intravenous access and wishes to have the port removed.  I discussed the indications risks, benefits, and alternatives with the patient.  He understood the risks of the procedure and wished to proceed.  OPERATIVE NOTE:  [name] was brought to the operating room on June 28, 2013, he was given intravenous sedation and monitored by the Anesthesia Service.  The chest was prepped and draped in usual sterile fashion.  The previous incision as well as the area around the port was anesthetized with 10 ml of 1% lidocaine.  After ensuring adequate local anesthetic effect, an incision was made through the previous scar.  The capsule overlying the port was incised. The catheter was removed intact and the port then was withdrawn from its capsule.  The capsule then was excised with electrocautery.  Hemostasis was achieved.  The wound was irrigated with saline.  The subcutaneous tissue was closed with interrupted 3-0 Vicryl suture and the skin was closed with a 4-0 Vicryl subcuticular suture.  Dermabond was applied to the wound.  The patient was taken to the  postanesthetic care unit in good condition.    Salvatore Decent Dorris Fetch, M.D.    SCH/MEDQ  D:  06/28/2013  T:  06/29/2013  Job:  [id number]

## 2013-07-06 ENCOUNTER — Other Ambulatory Visit: Payer: Self-pay | Admitting: *Deleted

## 2013-07-12 ENCOUNTER — Other Ambulatory Visit: Payer: Self-pay | Admitting: *Deleted

## 2013-07-13 ENCOUNTER — Other Ambulatory Visit: Payer: Self-pay | Admitting: *Deleted

## 2013-07-13 ENCOUNTER — Ambulatory Visit (INDEPENDENT_AMBULATORY_CARE_PROVIDER_SITE_OTHER): Payer: Medicare Other | Admitting: Thoracic Surgery (Cardiothoracic Vascular Surgery)

## 2013-07-13 ENCOUNTER — Encounter: Payer: Self-pay | Admitting: Thoracic Surgery (Cardiothoracic Vascular Surgery)

## 2013-07-13 ENCOUNTER — Ambulatory Visit
Admission: RE | Admit: 2013-07-13 | Discharge: 2013-07-13 | Disposition: A | Discharge status: Home / Self Care | Payer: Medicare Other | Source: Ambulatory Visit | Attending: Thoracic Surgery (Cardiothoracic Vascular Surgery) | Admitting: Thoracic Surgery (Cardiothoracic Vascular Surgery)

## 2013-07-13 VITALS — BP 170/74 | HR 76 | Resp 20 | Ht 70.0 in | Wt 197.0 lb

## 2013-07-13 DIAGNOSIS — C8590 Non-Hodgkin lymphoma, unspecified, unspecified site: Secondary | ICD-10-CM

## 2013-07-13 DIAGNOSIS — Z09 Encounter for follow-up examination after completed treatment for conditions other than malignant neoplasm: Secondary | ICD-10-CM

## 2013-07-13 DIAGNOSIS — J984 Other disorders of lung: Secondary | ICD-10-CM | POA: Diagnosis not present

## 2013-07-13 DIAGNOSIS — C8589 Other specified types of non-Hodgkin lymphoma, extranodal and solid organ sites: Secondary | ICD-10-CM

## 2013-07-13 DIAGNOSIS — C8510 Unspecified B-cell lymphoma, unspecified site: Secondary | ICD-10-CM

## 2013-07-13 NOTE — Progress Notes (Signed)
  HPI:  Duane Castaneda returns today for a scheduled followup visit. We did a Port-A-Cath removal on him on 06/28/2013. He says that he has not had any discomfort at all following removal of the port.  Past Medical History  Diagnosis Date  . History of colon cancer     Status post resection in 1993  . Essential hypertension, benign   . Degenerative joint disease   . Port catheter in place 08/21/2012  . Hypothyroidism   . Cataracts, bilateral   . Detached retina   . GERD (gastroesophageal reflux disease)   . Lymphoma     Diffuse large B cell status post R [facility] - Dr. Mariel Sleet, Remission since Dec 2007  . Follicular thyroid carcinoma     Dr. Gerrit Friends      Current Outpatient Prescriptions  Medication Sig Dispense Refill  . allopurinol (ZYLOPRIM) 300 MG tablet Take 300 mg by mouth daily.        Marland Kitchen aspirin 81 MG tablet Take 81 mg by mouth daily.      . bisoprolol-hydrochlorothiazide (ZIAC) 2.5-6.25 MG per tablet Take 1 tablet by mouth every other day.       . clobetasol cream (TEMOVATE) 0.05 % Apply 1 application topically 2 (two) times daily.      Marland Kitchen levothyroxine (SYNTHROID, LEVOTHROID) 150 MCG tablet Take 150 mcg by mouth daily.        Marland Kitchen lisinopril (PRINIVIL,ZESTRIL) 10 MG tablet Take 10 mg by mouth daily.       . Multiple Vitamin (MULTIVITAMIN) capsule Take 1 capsule by mouth daily.        Marland Kitchen omeprazole (PRILOSEC) 20 MG capsule Take 20 mg by mouth daily.       Marland Kitchen terazosin (HYTRIN) 10 MG capsule Take 10 mg by mouth at bedtime.        No current facility-administered medications for this visit.   Facility-Administered Medications Ordered in Other Visits  Medication Dose Route Frequency Provider Last Rate Last Dose  . sodium chloride 0.9 % injection 10 mL  10 mL Intravenous PRN Ellouise Newer, PA-C   10 mL at 06/14/13 1445    Physical Exam BP 170/74  Pulse 76  Resp 20  Ht 5\' 10"  (1.778 m)  Wt 197 lb (89.359 kg)  BMI 28.27 kg/m2  SpO46 53% 79 year old male in no acute  distress Wound clean dry and intact  Diagnostic Tests: Chest x-ray 07/13/2013 *RADIOLOGY REPORT*  Clinical Data: Recent portacath removal. Prior history of lymphoma  in 2007.  CHEST - 2 VIEW  Comparison: Two-view chest x-ray 06/25/2013. PET CT 04/25/2010,  10/05/2009, 03/28/2009.  Findings: Cardiac silhouette upper normal in size, unchanged.  Thoracic aorta atherosclerotic, unchanged. Hilar and mediastinal  contours otherwise unremarkable. Chronic pleuroparenchymal  scarring in the lung bases, right greater than left, unchanged.  Lungs otherwise clear. Bronchovascular markings normal. Pulmonary  vascularity normal. No pneumothorax. No pleural effusions.  Degenerative changes and DISH involving the thoracic spine.  IMPRESSION:  No acute cardiopulmonary disease. Stable chronic pleuroparenchymal  scarring in the bases, right greater than left.  Original Report Authenticated By: [name], M.D.  Impression: 79 year old gentleman who had Port-A-Cath removal 2 weeks ago. He is doing well. Wound is healing well. He's not having any discomfort.  Plan:  I would be happy to see him back at any time if I can be of any further assistance with his care.

## 2013-07-19 NOTE — Addendum Note (Signed)
Encounter addended by: Matilde Haymaker, PT on: 07/19/2013  1:43 PM<BR>     Documentation filed: Clinical Notes, Flowsheet VN

## 2013-07-22 IMAGING — CR DG KNEE 1-2V*R*
2 series · 2 of 2 positions shown · non-contrast
Comparison: 01/13/2013

CLINICAL DATA: Status post right knee arthroplasty.

RIGHT KNEE - 1-2 VIEW

[view not recorded (1 of 2)]
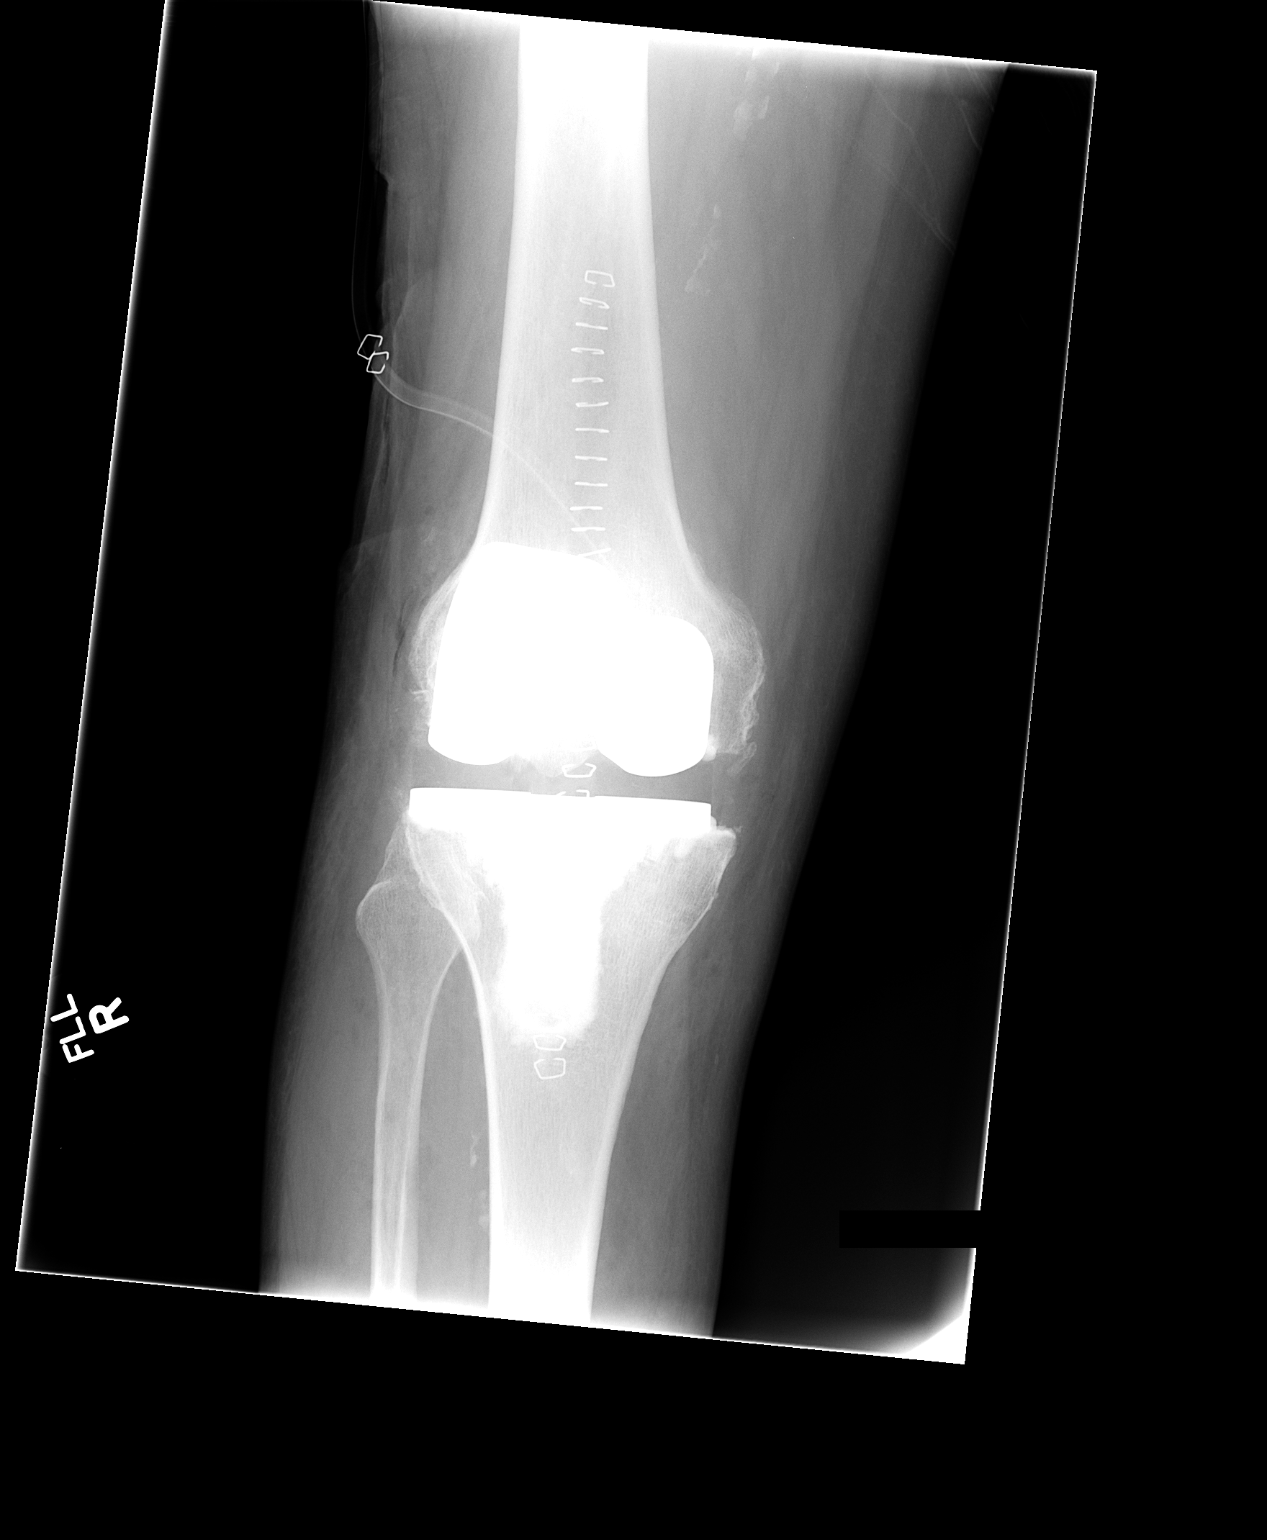

[view not recorded (2 of 2)]
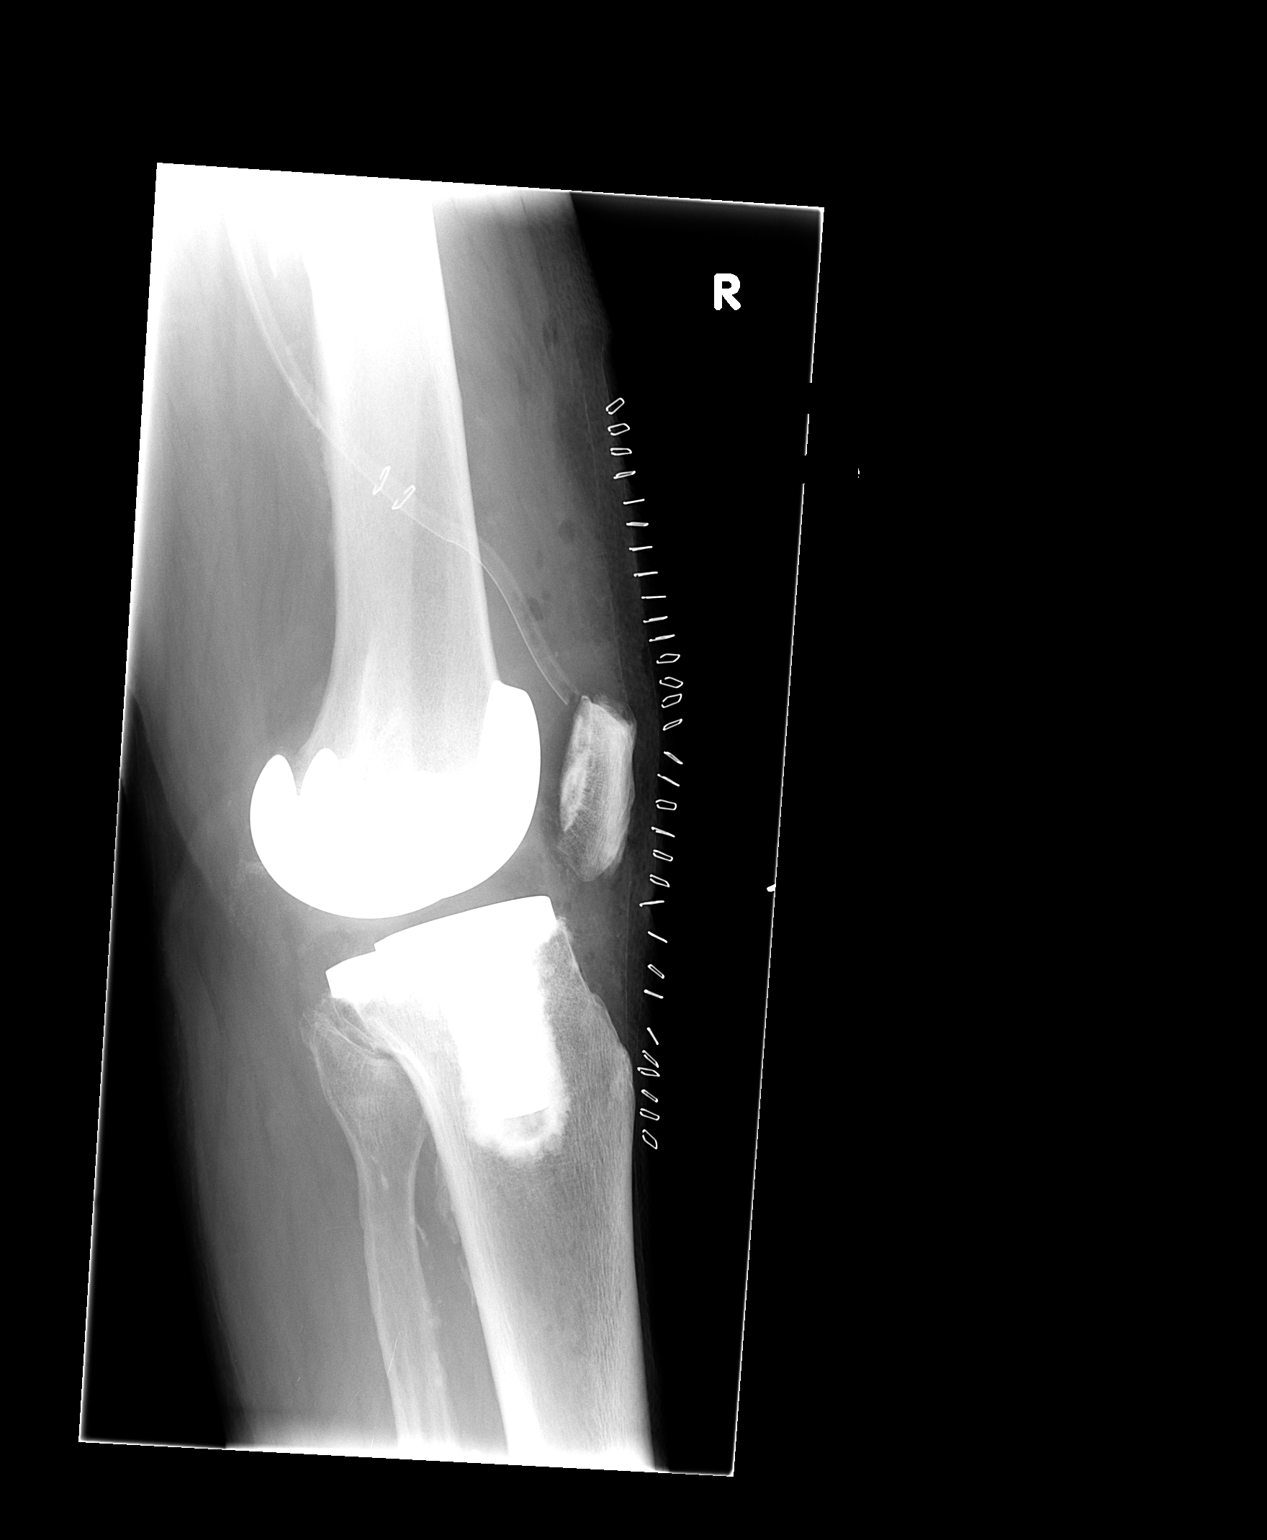

[2 of 2 positions shown; findings below may reference images not displayed]

FINDINGS: After total right knee arthroplasty, alignment of the
prosthesis appears near anatomic.  No fractures are identified.  A
single surgical drain is identified in the superior joint space.
IMPRESSION: Normal alignment following right knee arthroplasty.

## 2013-07-26 ENCOUNTER — Encounter (HOSPITAL_COMMUNITY): Payer: Medicare Other

## 2013-08-12 ENCOUNTER — Encounter: Payer: Self-pay | Admitting: Orthopedic Surgery

## 2013-08-12 ENCOUNTER — Ambulatory Visit (INDEPENDENT_AMBULATORY_CARE_PROVIDER_SITE_OTHER): Payer: Medicare Other | Admitting: Orthopedic Surgery

## 2013-08-12 VITALS — BP 178/98 | Ht 69.0 in | Wt 200.0 lb

## 2013-08-12 DIAGNOSIS — Z96651 Presence of right artificial knee joint: Secondary | ICD-10-CM

## 2013-08-12 DIAGNOSIS — Z96659 Presence of unspecified artificial knee joint: Secondary | ICD-10-CM | POA: Diagnosis not present

## 2013-08-12 NOTE — Patient Instructions (Signed)
Activities as tolerated. 

## 2013-08-12 NOTE — Progress Notes (Signed)
  Subjective:    Patient ID: Duane Castaneda, male    DOB: 1923-03-30, 79 y.o.   MRN: [id number]  Chief Complaint  Patient presents with  . Follow-up    6 month recheck right TKA DOS 02/08/13    HPI This patient is doing well after his right total knee his only problem is that he is having some issues with his balance was required to use a cane denies pain   Review of Systems Balance issues unsteady gait    Objective:   Physical Exam BP 178/98  Ht 5\' 9"  (1.753 m)  Wt 200 lb (90.719 kg)  BMI 29.52 kg/m2 Mr. Anzalone remains awake alert and oriented x3 mood and affect normal he is walking with a cane he has good flexion and his knee normal extension good strength no instability skin incision is clean dry and intact       Assessment & Plan:   Encounter Diagnosis  Name Primary?  . S/P total knee replacement, right Yes    X-ray at one year postop

## 2013-09-06 ENCOUNTER — Encounter (HOSPITAL_COMMUNITY): Payer: Medicare Other

## 2013-09-09 DIAGNOSIS — Z23 Encounter for immunization: Secondary | ICD-10-CM | POA: Diagnosis not present

## 2013-10-18 ENCOUNTER — Encounter (HOSPITAL_COMMUNITY): Payer: Medicare Other

## 2013-11-02 DIAGNOSIS — Z79899 Other long term (current) drug therapy: Secondary | ICD-10-CM | POA: Diagnosis not present

## 2013-11-02 DIAGNOSIS — I10 Essential (primary) hypertension: Secondary | ICD-10-CM | POA: Diagnosis not present

## 2013-11-02 DIAGNOSIS — E785 Hyperlipidemia, unspecified: Secondary | ICD-10-CM | POA: Diagnosis not present

## 2013-11-02 DIAGNOSIS — Z6830 Body mass index (BMI) 30.0-30.9, adult: Secondary | ICD-10-CM | POA: Diagnosis not present

## 2013-11-02 DIAGNOSIS — E039 Hypothyroidism, unspecified: Secondary | ICD-10-CM | POA: Diagnosis not present

## 2013-11-02 DIAGNOSIS — Z0000 Encounter for general adult medical examination without abnormal findings: Secondary | ICD-10-CM | POA: Diagnosis not present

## 2013-11-29 ENCOUNTER — Encounter (HOSPITAL_COMMUNITY): Payer: Medicare Other

## 2013-11-30 ENCOUNTER — Ambulatory Visit (HOSPITAL_COMMUNITY): Payer: Medicare Other | Admitting: Oncology

## 2013-12-06 ENCOUNTER — Encounter (HOSPITAL_COMMUNITY): Payer: Self-pay | Admitting: Oncology

## 2013-12-06 DIAGNOSIS — C73 Malignant neoplasm of thyroid gland: Secondary | ICD-10-CM | POA: Insufficient documentation

## 2013-12-06 DIAGNOSIS — C189 Malignant neoplasm of colon, unspecified: Secondary | ICD-10-CM

## 2013-12-06 DIAGNOSIS — C8590 Non-Hodgkin lymphoma, unspecified, unspecified site: Secondary | ICD-10-CM | POA: Insufficient documentation

## 2013-12-06 HISTORY — DX: Malignant neoplasm of thyroid gland: C73

## 2013-12-06 HISTORY — DX: Non-Hodgkin lymphoma, unspecified, unspecified site: C85.90

## 2013-12-06 HISTORY — DX: Malignant neoplasm of colon, unspecified: C18.9

## 2013-12-06 NOTE — Progress Notes (Signed)
Duane Ruths, MD [address] [place] [address]  History of NHL (non-Hodgkin's lymphoma) - Plan: US Venous Img Lower Unilateral Left  Primary thyroid follicular carcinoma - Plan: US Venous Img Lower Unilateral Left  History of Colon cancer - Plan: US Venous Img Lower Unilateral Left  Lymphoma - Plan: CBC with Differential, Comprehensive metabolic panel, Lactate dehydrogenase  Leg edema, left - Plan: US Venous Img Lower Unilateral Left  CURRENT THERAPY: Observation per NCCN guidelines of NHL.  INTERVAL HISTORY: Duane Castaneda 79 y.o. male returns for  regular  visit for followup of: 1. Diffuse large B-cell lymphoma presented with hypercalcemia and extensive adenopathy within the chest and abdomen. He presented with biopsy-proven disease on 12/04/2006 and treated with R-CHOP chemotherapy x 6 cycles with complete remission.  2. 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 Darnell Level. On Synthroid replacement  3. History of colon cancer in 1993 status post resection  I personally reviewed and went over laboratory results with the patient.  Labs are outdated and therefore we will perform labs today and update results.   He is S/P port-a-cath removal by Dr. Dorris Fetch on 06/29/2013.  He is also S/P a right TKA on 02/08/2013 by Dr. Romeo Apple.    NCCN guidelines recommends the follow surveillance for Stage I-IV NHL for those who attain a complete response to therapy:  A. H&P every 3-6 months for 5 years, then yearly or as clinically indicated.  B. Labs every 3-6 months for 5 years and then annually or as clinically indicated.  C. Repeat CT scans only as clinically indicated.   He denies any B symptoms including fevers, chills, night sweats, unintentional weight loss.    Hematologically, he denies any complaints and ROS questioning is negative.   Past Medical History    Diagnosis Date  . History of colon cancer     Status post resection in 1993  . Essential hypertension, benign   . Degenerative joint disease   . Port catheter in place 08/21/2012  . Hypothyroidism   . Cataracts, bilateral   . Detached retina   . GERD (gastroesophageal reflux disease)   . Lymphoma     Diffuse large B cell status post R CHOP - Dr. Mariel Sleet, Remission since Dec 2007  . Follicular thyroid carcinoma     Dr. Gerrit Friends  . History of NHL (non-Hodgkin's lymphoma) 12/06/2013    Presented with Hypercalcemia and extensive adenopathy in chest and abdomen.  Biopsy proven on 12/04/2006.  S/P R-CHOP x 6 cycles with complete remission  . Primary thyroid follicular carcinoma 12/06/2013    S/P total thyroidectomy by Dr. Darnell Level.  On synthroid replacement.  . History of Colon cancer 12/06/2013    S/P resection in 1993  . History of Primary thyroid follicular carcinoma 12/06/2013    S/P total thyroidectomy by Dr. Darnell Level.  On synthroid replacement.     has OA (osteoarthritis) of knee; Port catheter in place; Preoperative evaluation to rule out surgical contraindication; Essential hypertension, benign; S/P total knee replacement; History of NHL (non-Hodgkin's lymphoma); History of Primary thyroid follicular carcinoma; and History of Colon cancer on his problem list.     is allergic to tramadol.  Duane Castaneda does not currently have medications on file.  Past Surgical History  Procedure Laterality Date  . Detached retina left eye  1983    [facility]  . Bilateral cataract surgery  1988 Left, 1990 Right    [facility]  . Colon cancer resection  1993    Dr. Ulice Bold  . Lymphoma surgery  2007    Dr. Tally Joe  . Thyroid cancer surgery  2009    Dr. Danie Binder  . Transurethral resection of prostate  2011    Dr. Karl Bales  . Left knee replacement  2004    Dr. Arletha Grippe  . Hernia repair Bilateral 2000-Left, 2005-Right    Dr. Ulice Bold  . Total knee  arthroplasty Right 02/08/2013    Procedure: TOTAL KNEE ARTHROPLASTY;  Surgeon: Vickki Hearing, MD;  Location: [facility];  Service: Orthopedics;  Laterality: Right;  . Joint replacement    . Total knee arthroplasty Left 02/2013    Dr Luvenia Starch  . Cholecystectomy      1993  . Colon surgery    . Eye surgery    . Port-a-cath removal Left 06/28/2013    Procedure: REMOVAL PORT-A-CATH;  Surgeon: Loreli Slot, MD;  Location: [facility];  Service: Thoracic;  Laterality: Left;    Denies any headaches, dizziness, double vision, fevers, chills, night sweats, nausea, vomiting, diarrhea, constipation, chest pain, heart palpitations, shortness of breath, blood in stool, black tarry stool, urinary pain, urinary burning, urinary frequency, hematuria.   PHYSICAL EXAMINATION  ECOG PERFORMANCE STATUS: 0 - Asymptomatic  Filed Vitals:   12/07/13 1400  BP: 177/65  Pulse: 79  Temp: 97.4 F (36.3 C)  Resp: 18    GENERAL:alert, no distress, well nourished, well developed, comfortable, cooperative and smiling SKIN: skin color, texture, turgor are normal, no rashes or significant lesions HEAD: Normocephalic, No masses, lesions, tenderness or abnormalities EYES: normal, PERRLA, EOMI, Conjunctiva are pink and non-injected EARS: External ears normal OROPHARYNX:mucous membranes are moist  NECK: supple, no adenopathy, thyroid normal size, non-tender, without nodularity, no stridor, non-tender, trachea midline LYMPH:  no palpable lymphadenopathy, no hepatosplenomegaly BREAST:not examined LUNGS: clear to auscultation  HEART: regular rate & rhythm, no murmurs and no gallops ABDOMEN:abdomen soft, non-tender and normal bowel sounds BACK: Back symmetric, no curvature. EXTREMITIES:less then 2 second capillary refill, no joint deformities, effusion, or inflammation, no skin discoloration, no clubbing, no cyanosis, positive findings:  edema left lower extremity 1-2+ pitting with some increased heat but  nontender.  Negative Homan's sign.  NEURO: alert & oriented x 3 with fluent speech, no focal motor/sensory deficits, gait normal   LABORATORY DATA: CBC    Component Value Date/Time   WBC 6.8 06/25/2013 1324   RBC 4.11* 06/25/2013 1324   HGB 11.4* 06/25/2013 1324   HCT 35.0* 06/25/2013 1324   PLT 163 06/25/2013 1324   MCV 85.2 06/25/2013 1324   MCH 27.7 06/25/2013 1324   MCHC 32.6 06/25/2013 1324   RDW 15.7* 06/25/2013 1324   LYMPHSABS 0.7 06/14/2013 1649   MONOABS 0.4 06/14/2013 1649   EOSABS 0.2 06/14/2013 1649   BASOSABS 0.0 06/14/2013 1649      Chemistry      Component Value Date/Time   NA 139 06/25/2013 1324   K 4.7 06/25/2013 1324   CL 101 06/25/2013 1324   CO2 29 06/25/2013 1324   BUN 20 06/25/2013 1324   CREATININE 1.31 06/25/2013 1324      Component Value Date/Time   CALCIUM 9.5 06/25/2013 1324   ALKPHOS 112 06/25/2013 1324   AST 15 06/25/2013 1324   ALT 11 06/25/2013 1324   BILITOT 0.4 06/25/2013 1324     Results for Duane Castaneda, Duane Castaneda (MRN [id number]) as of  12/06/2013 16:59  Ref. Range 06/14/2013 16:49  LDH Latest Range: 94-250 U/L 162      ASSESSMENT: 1. Diffuse large B-cell lymphoma presented with hypercalcemia and extensive adenopathy within the chest and abdomen. He presented with biopsy-proven disease on 12/04/2006 and treated with R-CHOP chemotherapy x 6 cycles with complete remission.  2. 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 Darnell Level. On Synthroid replacement  3. History of colon cancer in 1993 status post resection 4. Left lower extremity edema  Patient Active Problem List   Diagnosis Date Noted  . History of NHL (non-Hodgkin's lymphoma) 12/06/2013  . History of Primary thyroid follicular carcinoma 12/06/2013  . History of Colon cancer 12/06/2013  . S/P total knee replacement 02/23/2013  . Preoperative evaluation to rule out surgical contraindication 12/30/2012  . Essential hypertension,  benign 12/30/2012  . Port catheter in place 08/21/2012  . OA (osteoarthritis) of knee 09/03/2011    PLAN:  1. I personally reviewed and went over laboratory results with the patient. 2. Labs today: CBC diff, CMET, LDH 3. Review of NCCN guidelines for NHL which is his biggest risk from a hem/onc standpoint.  4. Labs in 6 months: CBC diff, CMET, LDH 5. Left lower extremity doppler US to evaluate for DVT 6. Return in 6 months for follow-up.   THERAPY PLAN:  We will follow NCCN guidelines for surveillance for NHL which is his biggest risk factor out of his 3 cancers he has had in the past. NCCN guidelines recommends the follow surveillance for Stage I-IV NHL for those who attain a complete response to therapy:  A. H&P every 3-6 months for 5 years, then yearly or as clinically indicated.  B. Labs every 3-6 months for 5 years and then annually or as clinically indicated.  C. Repeat CT scans only as clinically indicated.    All questions were answered. The patient knows to call the clinic with any problems, questions or concerns. We can certainly see the patient much sooner if necessary.  Patient and plan discussed with Dr. Alla German and he is in agreement with the aforementioned.   KEFALAS,THOMAS    Addendum:  Negative for DVT  KEFALAS,THOMAS

## 2013-12-07 ENCOUNTER — Ambulatory Visit (HOSPITAL_COMMUNITY)
Admission: RE | Admit: 2013-12-07 | Discharge: 2013-12-07 | Disposition: A | Discharge status: Home / Self Care | Payer: Medicare Other | Source: Ambulatory Visit | Attending: Oncology | Admitting: Oncology

## 2013-12-07 ENCOUNTER — Encounter (HOSPITAL_COMMUNITY): Payer: Medicare Other | Attending: Oncology | Admitting: Oncology

## 2013-12-07 ENCOUNTER — Encounter (HOSPITAL_COMMUNITY): Payer: Self-pay | Admitting: Oncology

## 2013-12-07 VITALS — BP 177/65 | HR 79 | Temp 97.4°F | Resp 18 | Wt 203.0 lb

## 2013-12-07 DIAGNOSIS — C73 Malignant neoplasm of thyroid gland: Secondary | ICD-10-CM

## 2013-12-07 DIAGNOSIS — R600 Localized edema: Secondary | ICD-10-CM

## 2013-12-07 DIAGNOSIS — Z85038 Personal history of other malignant neoplasm of large intestine: Secondary | ICD-10-CM

## 2013-12-07 DIAGNOSIS — C189 Malignant neoplasm of colon, unspecified: Secondary | ICD-10-CM

## 2013-12-07 DIAGNOSIS — R609 Edema, unspecified: Secondary | ICD-10-CM

## 2013-12-07 DIAGNOSIS — M7989 Other specified soft tissue disorders: Secondary | ICD-10-CM | POA: Diagnosis not present

## 2013-12-07 DIAGNOSIS — C8589 Other specified types of non-Hodgkin lymphoma, extranodal and solid organ sites: Secondary | ICD-10-CM | POA: Insufficient documentation

## 2013-12-07 DIAGNOSIS — C8590 Non-Hodgkin lymphoma, unspecified, unspecified site: Secondary | ICD-10-CM

## 2013-12-07 DIAGNOSIS — Z85850 Personal history of malignant neoplasm of thyroid: Secondary | ICD-10-CM | POA: Diagnosis not present

## 2013-12-07 LAB — COMPREHENSIVE METABOLIC PANEL
ALT: 10 U/L (ref 0–53)
Alkaline Phosphatase: 102 U/L (ref 39–117)
BUN: 27 mg/dL — ABNORMAL HIGH (ref 6–23)
CO2: 26 mEq/L (ref 19–32)
GFR calc Af Amer: 51 mL/min — ABNORMAL LOW (ref >90)
GFR calc non Af Amer: 44 mL/min — ABNORMAL LOW (ref >90)
Glucose, Bld: 113 mg/dL — ABNORMAL HIGH (ref 70–99)
Potassium: 4.1 mEq/L (ref 3.7–5.3)
Sodium: 141 mEq/L (ref 137–147)

## 2013-12-07 LAB — CBC WITH DIFFERENTIAL/PLATELET
Eosinophils Relative: 4 % (ref 0–5)
Hemoglobin: 11.4 g/dL — ABNORMAL LOW (ref 13.0–17.0)
Lymphocytes Relative: 11 % — ABNORMAL LOW (ref 12–46)
Lymphs Abs: 0.6 10*3/uL — ABNORMAL LOW (ref 0.7–4.0)
MCV: 89.1 fL (ref 78.0–100.0)
Monocytes Relative: 7 % (ref 3–12)
Neutrophils Relative %: 78 % — ABNORMAL HIGH (ref 43–77)
Platelets: 167 10*3/uL (ref 150–400)
RBC: 3.95 MIL/uL — ABNORMAL LOW (ref 4.22–5.81)
WBC: 5.6 10*3/uL (ref 4.0–10.5)

## 2013-12-07 NOTE — Progress Notes (Signed)
Duane Castaneda presented for labwork. Labs per MD order drawn via Peripheral Line 23 gauge needle inserted in left antecubital.  Good blood return present. Procedure without incident.  Needle removed intact. Patient tolerated procedure well.

## 2013-12-07 NOTE — Patient Instructions (Signed)
[facility] Discharge Instructions  RECOMMENDATIONS MADE BY THE CONSULTANT AND ANY TEST RESULTS WILL BE SENT TO YOUR REFERRING PHYSICIAN.  Lab work today. Ultrasound of left leg before you leave hospital. We will call you if there are any abnormal/unexpected results. Return to clinic in 6 months for lab work and then to see the doctor. Report any issues/concerns to clinic as needed prior to appointments.  Thank you for choosing [facility] to provide your oncology and hematology care.  To afford each patient quality time with our providers, please arrive at least 15 minutes before your scheduled appointment time.  With your help, our goal is to use those 15 minutes to complete the necessary work-up to ensure our physicians have the information they need to help with your evaluation and healthcare recommendations.    Effective January 1st, 2014, we ask that you re-schedule your appointment with our physicians should you arrive 10 or more minutes late for your appointment.  We strive to give you quality time with our providers, and arriving late affects you and other patients whose appointments are after yours.    Again, thank you for choosing [facility].  Our hope is that these requests will decrease the amount of time that you wait before being seen by our physicians.       _____________________________________________________________  Should you have questions after your visit to [facility], please contact our office at [phone] between the hours of 8:30 a.m. and 5:00 p.m.  Voicemails left after 4:30 p.m. will not be returned until the following business day.  For prescription refill requests, have your pharmacy contact our office with your prescription refill request.

## 2014-01-12 DIAGNOSIS — H3530 Unspecified macular degeneration: Secondary | ICD-10-CM | POA: Diagnosis not present

## 2014-01-12 DIAGNOSIS — Z961 Presence of intraocular lens: Secondary | ICD-10-CM | POA: Diagnosis not present

## 2014-02-10 ENCOUNTER — Ambulatory Visit (INDEPENDENT_AMBULATORY_CARE_PROVIDER_SITE_OTHER): Payer: Medicare Other | Admitting: Orthopedic Surgery

## 2014-02-10 ENCOUNTER — Ambulatory Visit (INDEPENDENT_AMBULATORY_CARE_PROVIDER_SITE_OTHER): Payer: Medicare Other

## 2014-02-10 ENCOUNTER — Encounter: Payer: Self-pay | Admitting: Orthopedic Surgery

## 2014-02-10 VITALS — BP 176/95 | Ht 69.0 in | Wt 201.0 lb

## 2014-02-10 DIAGNOSIS — Z96659 Presence of unspecified artificial knee joint: Secondary | ICD-10-CM

## 2014-02-10 NOTE — Progress Notes (Signed)
Patient ID: Duane Castaneda, male   DOB: 12-20-1922, 80 y.o.   MRN: [id number]  Encounter Diagnosis  Name Primary?  . S/P total knee replacement Yes    Chief Complaint  Patient presents with  . Follow-up    1 year follow up right total knee with xray, March 2014    BP 176/95  Ht 5\' 9"  (1.753 m)  Wt 201 lb (91.173 kg)  BMI 29.67 kg/m2  The patient is doing very well status post knee replacement  Review of systems normal  Exam he remains ointment x3 pleasant mood walks with a cane full flexion extension of his knee normal range of motion stability and strength  X-rays show normal alignment  Return 1 year x-ray again

## 2014-04-21 ENCOUNTER — Ambulatory Visit (INDEPENDENT_AMBULATORY_CARE_PROVIDER_SITE_OTHER): Payer: Medicare Other | Admitting: Otolaryngology

## 2014-04-21 DIAGNOSIS — H903 Sensorineural hearing loss, bilateral: Secondary | ICD-10-CM

## 2014-04-21 DIAGNOSIS — H6120 Impacted cerumen, unspecified ear: Secondary | ICD-10-CM

## 2014-06-04 NOTE — Progress Notes (Signed)
Duane Grills, MD [place] [address]  History of NHL (non-Hodgkin's lymphoma) - Plan: TSH, CBC with Differential, Comprehensive metabolic panel, TSH, Lactate dehydrogenase, Beta 2 microglobulin, serum  History of Primary thyroid follicular carcinoma - Plan: TSH, Thyroglobulin, Thyroglobulin antibody, TSH, Thyroglobulin, Thyroglobulin antibody  History of Colon cancer  CURRENT THERAPY: Observation per NCCN guidelines of NHL.  INTERVAL HISTORY: Duane Castaneda 80 y.o. male returns for  regular  visit for followup of:  1. Diffuse large B-cell lymphoma presented with hypercalcemia and extensive adenopathy within the chest and abdomen. He presented with biopsy-proven disease on 12/04/2006 and treated with R-CHOP chemotherapy x 6 cycles with complete remission.  2. 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 Armandina Gemma. On Synthroid replacement  3. History of colon cancer in 1993 status post resection  I personally reviewed and went over laboratory results with the patient.  The results are noted within this dictation.  In April, he was seen at his primary care provider's office for symptoms or prostatitis.  He was treated with antibiotics and his symptoms resolved.   He is doing very well and denies any complaints.  He denies any B symptoms, changes in his bowel habits, unintentional weight loss, decreased appetite.  Hematologically and oncologically, he denies any complaints and ROS questioning is negative.   Past Medical History  Diagnosis Date  . History of colon cancer     Status post resection in 1993  . Essential hypertension, benign   . Degenerative joint disease   . Port catheter in place 08/21/2012  . Hypothyroidism   . Cataracts, bilateral   . Detached retina   . GERD (gastroesophageal reflux disease)   . Lymphoma     Diffuse large B cell status post R CHOP - Dr.  Tressie Stalker, Remission since Dec 2007  . Follicular thyroid carcinoma     Dr. Harlow Asa  . History of NHL (non-Hodgkin's lymphoma) 12/06/2013    Presented with Hypercalcemia and extensive adenopathy in chest and abdomen.  Biopsy proven on 12/04/2006.  S/P R-CHOP x 6 cycles with complete remission  . Primary thyroid follicular carcinoma 65/68/1275    S/P total thyroidectomy by Dr. Armandina Gemma.  On synthroid replacement.  . History of Colon cancer 12/06/2013    S/P resection in 1993  . History of Primary thyroid follicular carcinoma 17/00/1749    S/P total thyroidectomy by Dr. Armandina Gemma.  On synthroid replacement.     has OA (osteoarthritis) of knee; Port catheter in place; Preoperative evaluation to rule out surgical contraindication; Essential hypertension, benign; S/P total knee replacement; History of NHL (non-Hodgkin's lymphoma); History of Primary thyroid follicular carcinoma; and History of Colon cancer on his problem list.     is allergic to tramadol.  Duane Castaneda does not currently have medications on file.  Past Surgical History  Procedure Laterality Date  . Detached retina left eye  [place]  . Bilateral cataract surgery  1988 Left, [place]  . Colon cancer resection  1993    Dr. Leontine Locket  . Lymphoma surgery  2007    Dr. Jory Ee  . Thyroid cancer surgery  2009    Dr. Theodis Sato  . Transurethral resection of prostate  2011    Dr. Alphonsa Gin  . Left knee replacement  2004    Dr. Amado Coe  . Hernia repair Bilateral 2000-Left, 2005-Right  Dr. Leontine Locket  . Total knee arthroplasty Right 02/08/2013    Procedure: TOTAL KNEE ARTHROPLASTY;  Surgeon: Carole Civil, MD;  Location: [facility];  Service: Orthopedics;  Laterality: Right;  . Joint replacement    . Total knee arthroplasty Left 02/2013    Dr Anselm Jungling  . Cholecystectomy      1993  . Colon surgery    . Eye surgery    . Port-a-cath removal Left 06/28/2013     Procedure: REMOVAL PORT-A-CATH;  Surgeon: Melrose Nakayama, MD;  Location: [facility];  Service: Thoracic;  Laterality: Left;    Denies any headaches, dizziness, double vision, fevers, chills, night sweats, nausea, vomiting, diarrhea, constipation, chest pain, heart palpitations, shortness of breath, blood in stool, black tarry stool, urinary pain, urinary burning, urinary frequency, hematuria.   PHYSICAL EXAMINATION  ECOG PERFORMANCE STATUS: 0 - Asymptomatic  Filed Vitals:   06/07/14 1300  BP: 147/71  Pulse: 66  Temp: 97.7 F (36.5 C)  Resp: 18    GENERAL:alert, no distress, well nourished, well developed, comfortable, cooperative and smiling SKIN: skin color, texture, turgor are normal, no rashes or significant lesions HEAD: Normocephalic, No masses, lesions, tenderness or abnormalities EYES: normal, PERRLA, EOMI, Conjunctiva are pink and non-injected EARS: External ears normal OROPHARYNX:mucous membranes are moist  NECK: supple, no adenopathy, thyroid normal size, non-tender, without nodularity, no stridor, non-tender, trachea midline LYMPH:  no palpable lymphadenopathy BREAST:not examined LUNGS: clear to auscultation  HEART: regular rate & rhythm, no murmurs and no gallops ABDOMEN:abdomen soft, non-tender, normal bowel sounds and no masses or organomegaly BACK: Back symmetric, no curvature. EXTREMITIES:less then 2 second capillary refill, no joint deformities, effusion, or inflammation, no skin discoloration, no clubbing, no cyanosis  NEURO: alert & oriented x 3 with fluent speech, no focal motor/sensory deficits, gait normal   LABORATORY DATA: CBC    Component Value Date/Time   WBC 5.6 12/07/2013 1441   RBC 3.95* 12/07/2013 1441   HGB 11.4* 12/07/2013 1441   HCT 35.2* 12/07/2013 1441   PLT 167 12/07/2013 1441   MCV 89.1 12/07/2013 1441   MCH 28.9 12/07/2013 1441   MCHC 32.4 12/07/2013 1441   RDW 15.0 12/07/2013 1441   LYMPHSABS 0.6* 12/07/2013 1441   MONOABS 0.4  12/07/2013 1441   EOSABS 0.2 12/07/2013 1441   BASOSABS 0.0 12/07/2013 1441      Chemistry      Component Value Date/Time   NA 141 12/07/2013 1441   K 4.1 12/07/2013 1441   CL 102 12/07/2013 1441   CO2 26 12/07/2013 1441   BUN 27* 12/07/2013 1441   CREATININE 1.39* 12/07/2013 1441      Component Value Date/Time   CALCIUM 8.9 12/07/2013 1441   ALKPHOS 102 12/07/2013 1441   AST 14 12/07/2013 1441   ALT 10 12/07/2013 1441   BILITOT 0.4 12/07/2013 1441     Results for Duane Castaneda, Duane Castaneda (MRN [id number]) as of 06/07/2014 13:19  Ref. Range 12/07/2013 14:41  LDH Latest Range: 94-250 U/L 184     PENDING LABS: CBC diff, CMET, LDH, TSH, Thyroglobulin, Thyroglobulin antibody   ASSESSMENT:  1. Diffuse large B-cell lymphoma presented with hypercalcemia and extensive adenopathy within the chest and abdomen. He presented with biopsy-proven disease on 12/04/2006 and treated with R-CHOP chemotherapy x 6 cycles with complete remission.  2. 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 Armandina Gemma. On Synthroid replacement  3. History of colon cancer in 1993  status post resection  Patient Active Problem List   Diagnosis Date Noted  . History of NHL (non-Hodgkin's lymphoma) 12/06/2013  . History of Primary thyroid follicular carcinoma 56/21/3086  . History of Colon cancer 12/06/2013  . S/P total knee replacement 02/23/2013  . Preoperative evaluation to rule out surgical contraindication 12/30/2012  . Essential hypertension, benign 12/30/2012  . Port catheter in place 08/21/2012  . OA (osteoarthritis) of knee 09/03/2011    PLAN:  1. I personally reviewed and went over laboratory results with the patient.  The results are noted within this dictation. 2. Labs today: CBC diff, CMET, LDH, TSH, thyroglobulin, and thyroglobulin antibody 3. Labs in 1 year: CBC diff, CMET, LDH, TSH, thyroglobulin, and thyroglobulin  antibody 4. Review of NCCN guidelines for NHL which is his biggest risk from a hem/onc standpoint.  5. Return in 1 year for follow-up   THERAPY PLAN:  We will follow NCCN guidelines for surveillance for NHL which is his biggest risk factor out of his 3 cancers he has had in the past.  NCCN guidelines recommends the follow surveillance for Stage I-IV NHL for those who attain a complete response to therapy:  A. H&P every 3-6 months for 5 years, then yearly or as clinically indicated.  B. Labs every 3-6 months for 5 years and then annually or as clinically indicated.  C. Repeat CT scans only as clinically indicated.   All questions were answered. The patient knows to call the clinic with any problems, questions or concerns. We can certainly see the patient much sooner if necessary.  Patient and plan discussed with Dr. Farrel Gobble and he is in agreement with the aforementioned.   KEFALAS,THOMAS 06/07/2014

## 2014-06-07 ENCOUNTER — Encounter (HOSPITAL_COMMUNITY): Payer: Medicare Other | Attending: Oncology | Admitting: Oncology

## 2014-06-07 ENCOUNTER — Encounter (HOSPITAL_COMMUNITY): Payer: Self-pay | Admitting: Oncology

## 2014-06-07 ENCOUNTER — Encounter (HOSPITAL_COMMUNITY): Payer: Medicare Other

## 2014-06-07 VITALS — BP 147/71 | HR 66 | Temp 97.7°F | Resp 18 | Wt 198.9 lb

## 2014-06-07 DIAGNOSIS — C8590 Non-Hodgkin lymphoma, unspecified, unspecified site: Secondary | ICD-10-CM

## 2014-06-07 DIAGNOSIS — Z9221 Personal history of antineoplastic chemotherapy: Secondary | ICD-10-CM | POA: Insufficient documentation

## 2014-06-07 DIAGNOSIS — C8588 Other specified types of non-Hodgkin lymphoma, lymph nodes of multiple sites: Secondary | ICD-10-CM | POA: Diagnosis not present

## 2014-06-07 DIAGNOSIS — C8589 Other specified types of non-Hodgkin lymphoma, extranodal and solid organ sites: Secondary | ICD-10-CM

## 2014-06-07 DIAGNOSIS — C73 Malignant neoplasm of thyroid gland: Secondary | ICD-10-CM

## 2014-06-07 DIAGNOSIS — Z85038 Personal history of other malignant neoplasm of large intestine: Secondary | ICD-10-CM | POA: Insufficient documentation

## 2014-06-07 DIAGNOSIS — C189 Malignant neoplasm of colon, unspecified: Secondary | ICD-10-CM

## 2014-06-07 DIAGNOSIS — E890 Postprocedural hypothyroidism: Secondary | ICD-10-CM | POA: Diagnosis not present

## 2014-06-07 DIAGNOSIS — Z85850 Personal history of malignant neoplasm of thyroid: Secondary | ICD-10-CM | POA: Diagnosis not present

## 2014-06-07 LAB — CBC WITH DIFFERENTIAL/PLATELET
Basophils Absolute: 0 10*3/uL (ref 0.0–0.1)
Basophils Relative: 1 % (ref 0–1)
EOS PCT: 3 % (ref 0–5)
Eosinophils Absolute: 0.2 10*3/uL (ref 0.0–0.7)
HEMATOCRIT: 34.7 % — AB (ref 39.0–52.0)
HEMOGLOBIN: 11.6 g/dL — AB (ref 13.0–17.0)
LYMPHS ABS: 0.6 10*3/uL — AB (ref 0.7–4.0)
LYMPHS PCT: 11 % — AB (ref 12–46)
MCH: 29.4 pg (ref 26.0–34.0)
MCHC: 33.4 g/dL (ref 30.0–36.0)
MCV: 88.1 fL (ref 78.0–100.0)
MONO ABS: 0.5 10*3/uL (ref 0.1–1.0)
Monocytes Relative: 9 % (ref 3–12)
NEUTROS ABS: 4.6 10*3/uL (ref 1.7–7.7)
Neutrophils Relative %: 76 % (ref 43–77)
Platelets: 152 10*3/uL (ref 150–400)
RBC: 3.94 MIL/uL — AB (ref 4.22–5.81)
RDW: 15 % (ref 11.5–15.5)
WBC: 5.9 10*3/uL (ref 4.0–10.5)

## 2014-06-07 LAB — COMPREHENSIVE METABOLIC PANEL
ALT: 12 U/L (ref 0–53)
AST: 16 U/L (ref 0–37)
Albumin: 3.6 g/dL (ref 3.5–5.2)
Alkaline Phosphatase: 86 U/L (ref 39–117)
BILIRUBIN TOTAL: 0.5 mg/dL (ref 0.3–1.2)
BUN: 23 mg/dL (ref 6–23)
CALCIUM: 9.1 mg/dL (ref 8.4–10.5)
CHLORIDE: 101 meq/L (ref 96–112)
CO2: 27 meq/L (ref 19–32)
CREATININE: 1.4 mg/dL — AB (ref 0.50–1.35)
GFR, EST AFRICAN AMERICAN: 50 mL/min — AB (ref >90)
GFR, EST NON AFRICAN AMERICAN: 43 mL/min — AB (ref >90)
GLUCOSE: 105 mg/dL — AB (ref 70–99)
Potassium: 4.6 mEq/L (ref 3.7–5.3)
Sodium: 140 mEq/L (ref 137–147)
Total Protein: 6.3 g/dL (ref 6.0–8.3)

## 2014-06-07 LAB — LACTATE DEHYDROGENASE: LDH: 168 U/L (ref 94–250)

## 2014-06-07 NOTE — Patient Instructions (Signed)
[facility] Discharge Instructions  RECOMMENDATIONS MADE BY THE CONSULTANT AND ANY TEST RESULTS WILL BE SENT TO YOUR REFERRING PHYSICIAN.  EXAM FINDINGS BY THE PHYSICIAN TODAY AND SIGNS OR SYMPTOMS TO REPORT TO CLINIC OR PRIMARY PHYSICIAN: Exam and findings as discussed by Robynn Pane, PA - C.  You are doing well.  If there are any issues with your blood work we will contact you. Report night sweats, fevers, unexplained weight loss, etc.    INSTRUCTIONS/FOLLOW-UP: Follow-up in 1 year with labs and office visit.  Thank you for choosing [facility] to provide your oncology and hematology care.  To afford each patient quality time with our providers, please arrive at least 15 minutes before your scheduled appointment time.  With your help, our goal is to use those 15 minutes to complete the necessary work-up to ensure our physicians have the information they need to help with your evaluation and healthcare recommendations.    Effective January 1st, 2014, we ask that you re-schedule your appointment with our physicians should you arrive 10 or more minutes late for your appointment.  We strive to give you quality time with our providers, and arriving late affects you and other patients whose appointments are after yours.    Again, thank you for choosing [facility].  Our hope is that these requests will decrease the amount of time that you wait before being seen by our physicians.       _____________________________________________________________  Should you have questions after your visit to [facility], please contact our office at [phone] between the hours of 8:30 a.m. and 4:30 p.m.  Voicemails left after 4:30 p.m. will not be returned until the following business day.  For prescription refill requests, have your pharmacy contact our office with your prescription refill request.     _______________________________________________________________  We hope that we have given you very good care.  You may receive a patient satisfaction survey in the mail, please complete it and return it as soon as possible.  We value your feedback!  _______________________________________________________________  Have you asked about our STAR program?  STAR stands for Survivorship Training and Rehabilitation, and this is a nationally recognized cancer care program that focuses on survivorship and rehabilitation.  Cancer and cancer treatments may cause problems, such as, pain, making you feel tired and keeping you from doing the things that you need or want to do. Cancer rehabilitation can help. Our goal is to reduce these troubling effects and help you have the best quality of life possible.  You may receive a survey from a nurse that asks questions about your current state of health.  Based on the survey results, all eligible patients will be referred to the [facility] program for an evaluation so we can better serve you!  A frequently asked questions sheet is available upon request.

## 2014-06-07 NOTE — Progress Notes (Signed)
Labs drawn for cbcd,cmp,ldh,tsh,tglab,thyrog

## 2014-06-08 LAB — THYROGLOBULIN ANTIBODY: Thyroglobulin Ab: <20 IU/mL (ref <40.0)

## 2014-06-08 LAB — TSH: TSH: 4.65 u[IU]/mL — AB (ref 0.350–4.500)

## 2014-06-08 LAB — THYROGLOBULIN LEVEL: THYROGLOBULIN: 0.2 ng/mL (ref 0.0–55.0)

## 2014-06-13 DIAGNOSIS — M545 Low back pain, unspecified: Secondary | ICD-10-CM | POA: Diagnosis not present

## 2014-06-13 DIAGNOSIS — Z6828 Body mass index (BMI) 28.0-28.9, adult: Secondary | ICD-10-CM | POA: Diagnosis not present

## 2014-06-13 DIAGNOSIS — N419 Inflammatory disease of prostate, unspecified: Secondary | ICD-10-CM | POA: Diagnosis not present

## 2014-06-23 DIAGNOSIS — L821 Other seborrheic keratosis: Secondary | ICD-10-CM | POA: Diagnosis not present

## 2014-06-23 DIAGNOSIS — C44711 Basal cell carcinoma of skin of unspecified lower limb, including hip: Secondary | ICD-10-CM | POA: Diagnosis not present

## 2014-06-23 DIAGNOSIS — C44621 Squamous cell carcinoma of skin of unspecified upper limb, including shoulder: Secondary | ICD-10-CM | POA: Diagnosis not present

## 2014-06-23 DIAGNOSIS — L570 Actinic keratosis: Secondary | ICD-10-CM | POA: Diagnosis not present

## 2014-07-01 ENCOUNTER — Emergency Department (HOSPITAL_COMMUNITY): Payer: Medicare Other

## 2014-07-01 ENCOUNTER — Emergency Department (HOSPITAL_COMMUNITY)
Admission: EM | Admit: 2014-07-01 | Discharge: 2014-07-01 | Disposition: A | Discharge status: Home / Self Care | Payer: Medicare Other | Attending: Emergency Medicine | Admitting: Emergency Medicine

## 2014-07-01 ENCOUNTER — Encounter (HOSPITAL_COMMUNITY): Payer: Self-pay | Admitting: Emergency Medicine

## 2014-07-01 DIAGNOSIS — I714 Abdominal aortic aneurysm, without rupture, unspecified: Secondary | ICD-10-CM | POA: Diagnosis not present

## 2014-07-01 DIAGNOSIS — E039 Hypothyroidism, unspecified: Secondary | ICD-10-CM | POA: Insufficient documentation

## 2014-07-01 DIAGNOSIS — Z79899 Other long term (current) drug therapy: Secondary | ICD-10-CM | POA: Insufficient documentation

## 2014-07-01 DIAGNOSIS — K219 Gastro-esophageal reflux disease without esophagitis: Secondary | ICD-10-CM | POA: Diagnosis not present

## 2014-07-01 DIAGNOSIS — Z8669 Personal history of other diseases of the nervous system and sense organs: Secondary | ICD-10-CM | POA: Insufficient documentation

## 2014-07-01 DIAGNOSIS — K5900 Constipation, unspecified: Secondary | ICD-10-CM | POA: Diagnosis not present

## 2014-07-01 DIAGNOSIS — R159 Full incontinence of feces: Secondary | ICD-10-CM | POA: Insufficient documentation

## 2014-07-01 DIAGNOSIS — IMO0002 Reserved for concepts with insufficient information to code with codable children: Secondary | ICD-10-CM | POA: Insufficient documentation

## 2014-07-01 DIAGNOSIS — Z87898 Personal history of other specified conditions: Secondary | ICD-10-CM | POA: Insufficient documentation

## 2014-07-01 DIAGNOSIS — Z85038 Personal history of other malignant neoplasm of large intestine: Secondary | ICD-10-CM | POA: Insufficient documentation

## 2014-07-01 DIAGNOSIS — Z85850 Personal history of malignant neoplasm of thyroid: Secondary | ICD-10-CM | POA: Diagnosis not present

## 2014-07-01 DIAGNOSIS — Z7982 Long term (current) use of aspirin: Secondary | ICD-10-CM | POA: Insufficient documentation

## 2014-07-01 DIAGNOSIS — I10 Essential (primary) hypertension: Secondary | ICD-10-CM | POA: Insufficient documentation

## 2014-07-01 DIAGNOSIS — Z87891 Personal history of nicotine dependence: Secondary | ICD-10-CM | POA: Insufficient documentation

## 2014-07-01 DIAGNOSIS — R197 Diarrhea, unspecified: Secondary | ICD-10-CM | POA: Insufficient documentation

## 2014-07-01 LAB — CBC WITH DIFFERENTIAL/PLATELET
BASOS PCT: 0 % (ref 0–1)
Basophils Absolute: 0 10*3/uL (ref 0.0–0.1)
EOS ABS: 0.1 10*3/uL (ref 0.0–0.7)
EOS PCT: 1 % (ref 0–5)
HCT: 35.5 % — ABNORMAL LOW (ref 39.0–52.0)
HEMOGLOBIN: 12.1 g/dL — AB (ref 13.0–17.0)
LYMPHS ABS: 0.3 10*3/uL — AB (ref 0.7–4.0)
Lymphocytes Relative: 2 % — ABNORMAL LOW (ref 12–46)
MCH: 30 pg (ref 26.0–34.0)
MCHC: 34.1 g/dL (ref 30.0–36.0)
MCV: 88.1 fL (ref 78.0–100.0)
MONOS PCT: 6 % (ref 3–12)
Monocytes Absolute: 0.8 10*3/uL (ref 0.1–1.0)
NEUTROS PCT: 91 % — AB (ref 43–77)
Neutro Abs: 11.9 10*3/uL — ABNORMAL HIGH (ref 1.7–7.7)
Platelets: 133 10*3/uL — ABNORMAL LOW (ref 150–400)
RBC: 4.03 MIL/uL — AB (ref 4.22–5.81)
RDW: 15.5 % (ref 11.5–15.5)
WBC: 13.1 10*3/uL — ABNORMAL HIGH (ref 4.0–10.5)

## 2014-07-01 LAB — URINALYSIS, ROUTINE W REFLEX MICROSCOPIC
BILIRUBIN URINE: NEGATIVE
GLUCOSE, UA: NEGATIVE mg/dL
KETONES UR: NEGATIVE mg/dL
Leukocytes, UA: NEGATIVE
Nitrite: NEGATIVE
PROTEIN: NEGATIVE mg/dL
Specific Gravity, Urine: 1.01 (ref 1.005–1.030)
UROBILINOGEN UA: 0.2 mg/dL (ref 0.0–1.0)
pH: 5.5 (ref 5.0–8.0)

## 2014-07-01 LAB — BASIC METABOLIC PANEL
Anion gap: 12 (ref 5–15)
BUN: 30 mg/dL — AB (ref 6–23)
CALCIUM: 8.8 mg/dL (ref 8.4–10.5)
CO2: 25 mEq/L (ref 19–32)
CREATININE: 1.38 mg/dL — AB (ref 0.50–1.35)
Chloride: 98 mEq/L (ref 96–112)
GFR, EST AFRICAN AMERICAN: 51 mL/min — AB (ref >90)
GFR, EST NON AFRICAN AMERICAN: 44 mL/min — AB (ref >90)
GLUCOSE: 170 mg/dL — AB (ref 70–99)
POTASSIUM: 4 meq/L (ref 3.7–5.3)
Sodium: 135 mEq/L — ABNORMAL LOW (ref 137–147)

## 2014-07-01 LAB — URINE MICROSCOPIC-ADD ON

## 2014-07-01 MED ORDER — IOHEXOL 300 MG/ML  SOLN
100.0000 mL | Freq: Once | INTRAMUSCULAR | Status: AC | PRN
  Administered 2014-07-01: 100 mL via INTRAVENOUS

## 2014-07-01 MED ORDER — MAGNESIUM CITRATE PO SOLN: 1.0000 | Freq: Once | ORAL | Status: DC

## 2014-07-01 MED ORDER — FLEET ENEMA 7-19 GM/118ML RE ENEM
1.0000 | ENEMA | Freq: Once | RECTAL | Status: AC
  Administered 2014-07-01: 1 via RECTAL

## 2014-07-01 MED ORDER — IOHEXOL 300 MG/ML  SOLN
50.0000 mL | Freq: Once | INTRAMUSCULAR | Status: AC | PRN
  Administered 2014-07-01: 50 mL via ORAL

## 2014-07-01 MED ORDER — GLYCERIN (LAXATIVE) 2 G RE SUPP: 1.0000 | Freq: Once | RECTAL | Status: DC | PRN

## 2014-07-01 MED ORDER — SODIUM CHLORIDE 0.9 % IJ SOLN
INTRAMUSCULAR | Status: AC
  Filled 2014-07-01: qty 18

## 2014-07-01 NOTE — ED Notes (Signed)
CT notified that pt. Has finished drinking contrast.

## 2014-07-01 NOTE — ED Provider Notes (Signed)
CSN: [id number]     Arrival date & time 07/01/14  0745 History  This chart was scribed for Dorie Rank, MD by Vernell Barrier, ED scribe. This patient was seen in room APA03/APA03 and the patient's care was started at 8:00 AM.    Chief Complaint  Patient presents with  . Encopresis   The history is provided by the patient. No language interpreter was used.   HPI Comments: Duane Castaneda is a 80 y.o. male who presents to the Emergency Department complaining of urinary and bowel incontinence. Initial constipation for the past couple of days; wife states she gave him a stool softener which assisted in loosening some of the stool. Now pt is unable to control bowel or bladder. Decreased fluid intake; only reports drinking 3 glasses of water since yesterday. Pt is currently wearing a Depends diaper. Denies vomiting, fever, abdominal pain, numbness, weakness  Past Medical History  Diagnosis Date  . History of colon cancer     Status post resection in 1993  . Essential hypertension, benign   . Degenerative joint disease   . Port catheter in place 08/21/2012  . Hypothyroidism   . Cataracts, bilateral   . Detached retina   . GERD (gastroesophageal reflux disease)   . Lymphoma     Diffuse large B cell status post R CHOP - Dr. Tressie Stalker, Remission since Dec 2007  . Follicular thyroid carcinoma     Dr. Harlow Asa  . History of NHL (non-Hodgkin's lymphoma) 12/06/2013    Presented with Hypercalcemia and extensive adenopathy in chest and abdomen.  Biopsy proven on 12/04/2006.  S/P R-CHOP x 6 cycles with complete remission  . Primary thyroid follicular carcinoma 57/32/2025    S/P total thyroidectomy by Dr. Armandina Gemma.  On synthroid replacement.  . History of Colon cancer 12/06/2013    S/P resection in 1993  . History of Primary thyroid follicular carcinoma 42/70/6237    S/P total thyroidectomy by Dr. Armandina Gemma.  On synthroid replacement.    Past Surgical History  Procedure Laterality Date  .  Detached retina left eye  [place]  . Bilateral cataract surgery  1988 Left, [place]  . Colon cancer resection  1993    Dr. Leontine Locket  . Lymphoma surgery  2007    Dr. Jory Ee  . Thyroid cancer surgery  2009    Dr. Theodis Sato  . Transurethral resection of prostate  2011    Dr. Alphonsa Gin  . Left knee replacement  2004    Dr. Amado Coe  . Hernia repair Bilateral 2000-Left, 2005-Right    Dr. Leontine Locket  . Total knee arthroplasty Right 02/08/2013    Procedure: TOTAL KNEE ARTHROPLASTY;  Surgeon: Carole Civil, MD;  Location: [facility];  Service: Orthopedics;  Laterality: Right;  . Joint replacement    . Total knee arthroplasty Left 02/2013    Dr Anselm Jungling  . Cholecystectomy      1993  . Colon surgery    . Eye surgery    . Port-a-cath removal Left 06/28/2013    Procedure: REMOVAL PORT-A-CATH;  Surgeon: Melrose Nakayama, MD;  Location: [place];  Service: Thoracic;  Laterality: Left;   Family History  Problem Relation Age of Onset  . Arthritis    . Lung disease    . Leukemia Mother   . Lung cancer Father   . Cancer Sister   . Cancer Brother    History  Substance Use  Topics  . Smoking status: Former Smoker -- 1.00 packs/day for 15 years    Types: Cigarettes  . Smokeless tobacco: Never Used     Comment: quit smoking 33 years ago  . Alcohol Use: No    Review of Systems  Constitutional: Negative for fever.  Gastrointestinal: Positive for diarrhea and constipation. Negative for vomiting and abdominal pain.  Neurological: Negative for weakness and numbness.  All other systems reviewed and are negative.  Allergies  Tramadol  Home Medications   Prior to Admission medications   Medication Sig Start Date End Date Taking? Authorizing Provider  allopurinol (ZYLOPRIM) 300 MG tablet Take 300 mg by mouth daily.      Historical Provider, MD  aspirin 81 MG tablet Take 81 mg by mouth daily.    Historical Provider, MD   bisoprolol-hydrochlorothiazide [facility]) 2.5-6.25 MG per tablet Take 1 tablet by mouth every other day.     Historical Provider, MD  clobetasol cream (TEMOVATE) 2.40 % Apply 1 application topically 2 (two) times daily.    Historical Provider, MD  levothyroxine (SYNTHROID, LEVOTHROID) 150 MCG tablet Take 150 mcg by mouth daily.      Historical Provider, MD  lisinopril (PRINIVIL,ZESTRIL) 10 MG tablet Take 10 mg by mouth daily.     Historical Provider, MD  Multiple Vitamin (MULTIVITAMIN) capsule Take 1 capsule by mouth daily.      Historical Provider, MD  omeprazole (PRILOSEC) 20 MG capsule Take 20 mg by mouth daily.     Historical Provider, MD  terazosin (HYTRIN) 10 MG capsule Take 10 mg by mouth at bedtime.     Historical Provider, MD   BP 147/63  Pulse 83  Temp(Src) 98 F (36.7 C) (Oral)  Resp 22  Ht 5\' 10"  (1.778 m)  Wt 200 lb (90.719 kg)  BMI 28.70 kg/m2  SpO2 100%  Physical Exam  Nursing note and vitals reviewed. Constitutional: No distress.  elderly  HENT:  Head: Normocephalic and atraumatic.  Right Ear: External ear normal.  Left Ear: External ear normal.  Eyes: Conjunctivae are normal. Right eye exhibits no discharge. Left eye exhibits no discharge. No scleral icterus.  Neck: Neck supple. No tracheal deviation present.  Cardiovascular: Normal rate, regular rhythm and intact distal pulses.   Pulmonary/Chest: Effort normal and breath sounds normal. No stridor. No respiratory distress. He has no wheezes. He has no rales.  Abdominal: Soft. Bowel sounds are normal. He exhibits mass. He exhibits no distension. There is no tenderness. There is no rebound and no guarding.  Questionable mass in suprapubic area.  Genitourinary:  Large amount of firm, brown stool in rectal vault. No blood. No mass  Musculoskeletal: He exhibits no edema and no tenderness.  Neurological: He is alert. He has normal strength. No cranial nerve deficit (no facial droop, extraocular movements intact, no slurred  speech) or sensory deficit. He exhibits normal muscle tone. He displays no seizure activity. Coordination normal.  Skin: Skin is warm and dry. No rash noted.  Psychiatric: He has a normal mood and affect.    ED Course  Procedures  Manual disimpaction attempted during rectal exam.  Not a large amount of stool obtained.  DIAGNOSTIC STUDIES: Oxygen Saturation is 100% on room air, normal by my interpretation.    COORDINATION OF CARE: At 8:05 AM: Pt has a fecal impaction on exam.  Discussed treatment plan with patient which includes giving the prescription an enema to assist with bowel movement. Patient agrees.     Labs Review Labs Reviewed  CBC WITH DIFFERENTIAL - Abnormal; Notable for the following:    WBC 13.1 (*)    RBC 4.03 (*)    Hemoglobin 12.1 (*)    HCT 35.5 (*)    Platelets 133 (*)    Neutrophils Relative % 91 (*)    Neutro Abs 11.9 (*)    Lymphocytes Relative 2 (*)    Lymphs Abs 0.3 (*)    All other components within normal limits  BASIC METABOLIC PANEL - Abnormal; Notable for the following:    Sodium 135 (*)    Glucose, Bld 170 (*)    BUN 30 (*)    Creatinine, Ser 1.38 (*)    GFR calc non Af Amer 44 (*)    GFR calc Af Amer 51 (*)    All other components within normal limits  URINALYSIS, ROUTINE W REFLEX MICROSCOPIC - Abnormal; Notable for the following:    Hgb urine dipstick TRACE (*)    All other components within normal limits  URINE MICROSCOPIC-ADD ON  POC OCCULT BLOOD, ED    Imaging Review Ct Abdomen Pelvis W Contrast  07/01/2014   CLINICAL DATA:  Constipation for couple of days  EXAM: CT ABDOMEN AND PELVIS WITH CONTRAST  TECHNIQUE: Multidetector CT imaging of the abdomen and pelvis was performed using the standard protocol following bolus administration of intravenous contrast.  CONTRAST:  38mL OMNIPAQUE IOHEXOL 300 MG/ML SOLN, 164mL OMNIPAQUE IOHEXOL 300 MG/ML SOLN  COMPARISON:  PET-CT 04/25/2010  FINDINGS: There is mild bibasilar atelectasis.  The liver  demonstrates no focal abnormality. There is no intrahepatic or extrahepatic biliary ductal dilatation. The gallbladder is surgically absent. The spleen demonstrates no focal abnormality.There are 2 large interpolar right renal mass is measuring 5.2 cm and 5.9 cm respectively most consistent with cysts. There is a smaller 3.5 cm right lower pole hypodense, fluid attenuating renal mass most consistent with a cyst. There is a 2.2 cm intermediate density anterior left interpolar renal mass measuring 39 Hounsfield units unchanged compared with the prior exam of 04/25/2010 likely representing a proteinaceous cyst. The adrenal glands and pancreas are normal. The bladder is unremarkable.  The stomach, duodenum, small intestine, and large intestine demonstrate no contrast extravasation or dilatation. There is rectal fecal impaction. There is a moderate amount of stool throughout the colon. There is evidence of prior right hemicolectomy. There is no pneumoperitoneum, pneumatosis, or portal venous gas. There is no abdominal or pelvic free fluid. There is no lymphadenopathy.  There is an infrarenal abdominal aortic aneurysm measuring 3.1 x 3.3 cm. There is abdominal aortic atherosclerosis.  There are no lytic or sclerotic osseous lesions. There is degenerative disc disease throughout the lumbar spine. There is degenerative facet arthropathy throughout the lumbar spine. There is bilateral mild osteoarthritis of the hips.  IMPRESSION: 1. Rectal fecal impaction with a moderate amount of stool throughout the remainder of the colon. 2. Bilateral renal cysts unchanged compared with 04/25/2010. 3. Lumbar spine spondylosis. 4. Infrarenal abdominal aortic aneurysm measuring 3.1 x 3.3 cm. Recommend follow up by [place] in 3years. This recommendation follows ACR consensus guidelines: White Paper of the ACR Incidental Findings Committee II on Vascular Findings. Joellyn Rued [id number]; 54:[id number].   Electronically Signed   By: Kathreen Devoid   On:  07/01/2014 12:02   Dg Abd Acute W/chest  07/01/2014   CLINICAL DATA:  Abdominal pain. Bladder and bowel incontinence. Constipation. Prior history of colon cancer with resection in 1993. Prior history of lymphoma and thyroid cancer.  EXAM: ACUTE  ABDOMEN SERIES (ABDOMEN 2 VIEW & CHEST 1 VIEW)  COMPARISON:  Two-view chest x-ray 07/13/2013, 06/25/2013. PET-CT 04/25/2010.  FINDINGS: Moderate gaseous distention of jejunal loops in the left upper quadrant demonstrating air-fluid levels on the erect image. No free intraperitoneal air. Large stool burden throughout the colon. Surgical clips throughout the abdomen from the prior right hemicolectomy. Phleboliths in the pelvis. No visible opaque urinary tract calculi. Severe degenerative changes throughout the thoracic and lumbar spine.  Cardiac silhouette upper normal in size, unchanged. Thoracic aorta atherosclerotic, unchanged. Hilar and mediastinal contours otherwise unremarkable. Lungs clear. Bronchovascular markings normal. Pulmonary vascularity normal. No visible pleural effusions. No pneumothorax. Surgical clips in the right side of the neck from prior right hemithyroidectomy.  IMPRESSION: 1. Partial small bowel obstruction.  No free intraperitoneal air. 2. Large stool burden. 3.  No acute cardiopulmonary disease.   Electronically Signed   By: Evangeline Dakin M.D.   On: 07/01/2014 09:35    MDM   Final diagnoses:  Constipation, unspecified constipation type   Discussed findings with family.  Constipation is the cause of his incontinence.  Attempted enema and disimpaction.  Pt is eating well, not in any pain.  Family and patient are comfortable going home trying to continue treatment at home.   I personally performed the services described in this documentation, which was scribed in my presence. The recorded information has been reviewed and is accurate.     Dorie Rank, MD 07/01/14 1302

## 2014-07-01 NOTE — ED Notes (Signed)
Pt complain of constipation for a couple of days. States he has had a little bit of diarrhea also. Pt complain of rectal pain when sitting

## 2014-07-01 NOTE — Discharge Instructions (Signed)

## 2014-07-01 NOTE — ED Notes (Signed)
Occult blood negative

## 2014-08-10 DIAGNOSIS — Z85828 Personal history of other malignant neoplasm of skin: Secondary | ICD-10-CM | POA: Diagnosis not present

## 2014-08-10 DIAGNOSIS — C44519 Basal cell carcinoma of skin of other part of trunk: Secondary | ICD-10-CM | POA: Diagnosis not present

## 2014-08-10 DIAGNOSIS — L980 Pyogenic granuloma: Secondary | ICD-10-CM | POA: Diagnosis not present

## 2014-08-31 DIAGNOSIS — Z23 Encounter for immunization: Secondary | ICD-10-CM | POA: Diagnosis not present

## 2014-10-05 DIAGNOSIS — Z85828 Personal history of other malignant neoplasm of skin: Secondary | ICD-10-CM | POA: Diagnosis not present

## 2014-10-05 DIAGNOSIS — Z08 Encounter for follow-up examination after completed treatment for malignant neoplasm: Secondary | ICD-10-CM | POA: Diagnosis not present

## 2014-10-05 DIAGNOSIS — L929 Granulomatous disorder of the skin and subcutaneous tissue, unspecified: Secondary | ICD-10-CM | POA: Diagnosis not present

## 2014-11-21 DIAGNOSIS — Z6828 Body mass index (BMI) 28.0-28.9, adult: Secondary | ICD-10-CM | POA: Diagnosis not present

## 2014-11-21 DIAGNOSIS — I10 Essential (primary) hypertension: Secondary | ICD-10-CM | POA: Diagnosis not present

## 2014-11-21 DIAGNOSIS — E063 Autoimmune thyroiditis: Secondary | ICD-10-CM | POA: Diagnosis not present

## 2014-11-21 DIAGNOSIS — E782 Mixed hyperlipidemia: Secondary | ICD-10-CM | POA: Diagnosis not present

## 2015-02-16 ENCOUNTER — Ambulatory Visit (INDEPENDENT_AMBULATORY_CARE_PROVIDER_SITE_OTHER): Payer: Medicare Other

## 2015-02-16 ENCOUNTER — Ambulatory Visit (INDEPENDENT_AMBULATORY_CARE_PROVIDER_SITE_OTHER): Payer: Medicare Other | Admitting: Orthopedic Surgery

## 2015-02-16 ENCOUNTER — Encounter: Payer: Self-pay | Admitting: Orthopedic Surgery

## 2015-02-16 VITALS — BP 148/76 | Ht 70.0 in | Wt 196.0 lb

## 2015-02-16 DIAGNOSIS — Z96651 Presence of right artificial knee joint: Secondary | ICD-10-CM

## 2015-02-16 NOTE — Progress Notes (Signed)
Patient ID: Duane Castaneda, male   DOB: 11/06/1923, 81 y.o.   MRN: [id number]  Post op annual TKA   Chief Complaint  Patient presents with  . Follow-up    annual follow up RT TKA    HPI Duane Castaneda is a 81 y.o. male.  Status post total knee 2014 here for his annual follow-up is not having any pain in his right or left knee he is using a cane he says he thinks is just old age and he needs support   Past Medical History  Diagnosis Date  . History of colon cancer     Status post resection in 1993  . Essential hypertension, benign   . Degenerative joint disease   . Port catheter in place 08/21/2012  . Hypothyroidism   . Cataracts, bilateral   . Detached retina   . GERD (gastroesophageal reflux disease)   . Lymphoma     Diffuse large B cell status post R CHOP - Dr. Tressie Stalker, Remission since Dec 2007  . Follicular thyroid carcinoma     Dr. Harlow Asa  . History of NHL (non-Hodgkin's lymphoma) 12/06/2013    Presented with Hypercalcemia and extensive adenopathy in chest and abdomen.  Biopsy proven on 12/04/2006.  S/P R-CHOP x 6 cycles with complete remission  . Primary thyroid follicular carcinoma 37/62/8315    S/P total thyroidectomy by Dr. Armandina Gemma.  On synthroid replacement.  . History of Colon cancer 12/06/2013    S/P resection in 1993  . History of Primary thyroid follicular carcinoma 17/61/6073    S/P total thyroidectomy by Dr. Armandina Gemma.  On synthroid replacement.     Past Surgical History  Procedure Laterality Date  . Detached retina left eye  [employer]  . Bilateral cataract surgery  1988 Left, [place]  . Colon cancer resection  1993    Dr. Leontine Locket  . Lymphoma surgery  2007    Dr. Jory Ee  . Thyroid cancer surgery  2009    Dr. Theodis Sato  . Transurethral resection of prostate  2011    Dr. Alphonsa Gin  . Left knee replacement  2004    Dr. Amado Coe  . Hernia repair Bilateral 2000-Left, 2005-Right    Dr. Leontine Locket  . Total knee arthroplasty Right 02/08/2013    Procedure: TOTAL KNEE ARTHROPLASTY;  Surgeon: Carole Civil, MD;  Location: [facility];  Service: Orthopedics;  Laterality: Right;  . Joint replacement    . Total knee arthroplasty Left 02/2013    Dr Anselm Jungling  . Cholecystectomy      1993  . Colon surgery    . Eye surgery    . Port-a-cath removal Left 06/28/2013    Procedure: REMOVAL PORT-A-CATH;  Surgeon: Melrose Nakayama, MD;  Location: [facility];  Service: Thoracic;  Laterality: Left;     Allergies  Allergen Reactions  . Tramadol Other (See Comments)    halucinations    Current Outpatient Prescriptions  Medication Sig Dispense Refill  . allopurinol (ZYLOPRIM) 300 MG tablet Take 300 mg by mouth daily.      Marland Kitchen aspirin 81 MG tablet Take 81 mg by mouth every other day.     . bisoprolol-hydrochlorothiazide (ZIAC) 2.5-6.25 MG per tablet Take 1 tablet by mouth every other day.     . levothyroxine (SYNTHROID, LEVOTHROID) 150 MCG tablet Take 150 mcg by mouth daily.      Marland Kitchen lisinopril (PRINIVIL,ZESTRIL) 10 MG tablet Take  10 mg by mouth daily.     . Multiple Vitamin (MULTIVITAMIN) capsule Take 1 capsule by mouth daily.      Marland Kitchen omeprazole (PRILOSEC) 20 MG capsule Take 20 mg by mouth daily.     Marland Kitchen terazosin (HYTRIN) 10 MG capsule Take 10 mg by mouth at bedtime.      No current facility-administered medications for this visit.   Facility-Administered Medications Ordered in Other Visits  Medication Dose Route Frequency Provider Last Rate Last Dose  . sodium chloride 0.9 % injection 10 mL  10 mL Intravenous PRN Baird Cancer, PA-C   10 mL at 06/14/13 1445    Review of Systems Review of Systems   Physical Exam Blood pressure 148/76, height 5\' 10"  (1.778 m), weight 196 lb (88.905 kg).  The patient is awake alert and oriented 3 mood and affect normal. General appearance well-groomed. The patient is ambulatory with no assistive device and no effective limp or alteration in  gait.  The knee remains stable and the anterior posterior and medial lateral plane. Quadriceps strength is normal. The incision healed well there is no swelling.  Knee flexion 120  Data Reviewed KNEE XRAYS : Normal stable postop film  Assessment S/P TKA   Plan    Follow-up a year x-ray again       Arther Abbott 02/16/2015, 3:40 PM

## 2015-03-01 DIAGNOSIS — Z961 Presence of intraocular lens: Secondary | ICD-10-CM | POA: Diagnosis not present

## 2015-03-01 DIAGNOSIS — H3531 Nonexudative age-related macular degeneration: Secondary | ICD-10-CM | POA: Diagnosis not present

## 2015-03-13 ENCOUNTER — Ambulatory Visit (HOSPITAL_COMMUNITY)
Admission: RE | Admit: 2015-03-13 | Discharge: 2015-03-13 | Disposition: A | Discharge status: Home / Self Care | Payer: Medicare Other | Source: Ambulatory Visit | Attending: Family Medicine | Admitting: Family Medicine

## 2015-03-13 ENCOUNTER — Other Ambulatory Visit (HOSPITAL_COMMUNITY): Payer: Self-pay | Admitting: Family Medicine

## 2015-03-13 DIAGNOSIS — Z85038 Personal history of other malignant neoplasm of large intestine: Secondary | ICD-10-CM | POA: Diagnosis not present

## 2015-03-13 DIAGNOSIS — J449 Chronic obstructive pulmonary disease, unspecified: Secondary | ICD-10-CM | POA: Insufficient documentation

## 2015-03-13 DIAGNOSIS — J069 Acute upper respiratory infection, unspecified: Secondary | ICD-10-CM

## 2015-03-13 DIAGNOSIS — R05 Cough: Secondary | ICD-10-CM | POA: Insufficient documentation

## 2015-03-13 DIAGNOSIS — J209 Acute bronchitis, unspecified: Secondary | ICD-10-CM | POA: Diagnosis not present

## 2015-03-13 DIAGNOSIS — E663 Overweight: Secondary | ICD-10-CM | POA: Diagnosis not present

## 2015-03-13 DIAGNOSIS — Z87891 Personal history of nicotine dependence: Secondary | ICD-10-CM | POA: Insufficient documentation

## 2015-03-13 DIAGNOSIS — I10 Essential (primary) hypertension: Secondary | ICD-10-CM | POA: Diagnosis not present

## 2015-03-13 DIAGNOSIS — Z6828 Body mass index (BMI) 28.0-28.9, adult: Secondary | ICD-10-CM | POA: Diagnosis not present

## 2015-03-21 DIAGNOSIS — Z6827 Body mass index (BMI) 27.0-27.9, adult: Secondary | ICD-10-CM | POA: Diagnosis not present

## 2015-03-21 DIAGNOSIS — J069 Acute upper respiratory infection, unspecified: Secondary | ICD-10-CM | POA: Diagnosis not present

## 2015-03-21 DIAGNOSIS — J441 Chronic obstructive pulmonary disease with (acute) exacerbation: Secondary | ICD-10-CM | POA: Diagnosis not present

## 2015-03-21 DIAGNOSIS — E663 Overweight: Secondary | ICD-10-CM | POA: Diagnosis not present

## 2015-04-27 ENCOUNTER — Ambulatory Visit (INDEPENDENT_AMBULATORY_CARE_PROVIDER_SITE_OTHER): Payer: Medicare Other | Admitting: Otolaryngology

## 2015-04-27 DIAGNOSIS — H903 Sensorineural hearing loss, bilateral: Secondary | ICD-10-CM

## 2015-05-02 DIAGNOSIS — Z6827 Body mass index (BMI) 27.0-27.9, adult: Secondary | ICD-10-CM | POA: Diagnosis not present

## 2015-05-02 DIAGNOSIS — M545 Low back pain: Secondary | ICD-10-CM | POA: Diagnosis not present

## 2015-05-02 DIAGNOSIS — E663 Overweight: Secondary | ICD-10-CM | POA: Diagnosis not present

## 2015-06-08 ENCOUNTER — Telehealth (HOSPITAL_COMMUNITY): Payer: Self-pay | Admitting: Hematology & Oncology

## 2015-06-08 ENCOUNTER — Encounter (HOSPITAL_BASED_OUTPATIENT_CLINIC_OR_DEPARTMENT_OTHER): Payer: Medicare Other

## 2015-06-08 ENCOUNTER — Ambulatory Visit (HOSPITAL_COMMUNITY): Payer: Medicare Other | Admitting: Oncology

## 2015-06-08 ENCOUNTER — Encounter (HOSPITAL_COMMUNITY): Payer: Medicare Other | Attending: Oncology | Admitting: Oncology

## 2015-06-08 ENCOUNTER — Other Ambulatory Visit (HOSPITAL_COMMUNITY): Payer: Medicare Other

## 2015-06-08 ENCOUNTER — Encounter (HOSPITAL_COMMUNITY): Payer: Self-pay | Admitting: Oncology

## 2015-06-08 VITALS — BP 122/56 | HR 65 | Temp 98.3°F | Resp 16 | Wt 189.2 lb

## 2015-06-08 DIAGNOSIS — C8590 Non-Hodgkin lymphoma, unspecified, unspecified site: Secondary | ICD-10-CM

## 2015-06-08 DIAGNOSIS — C73 Malignant neoplasm of thyroid gland: Secondary | ICD-10-CM

## 2015-06-08 DIAGNOSIS — C189 Malignant neoplasm of colon, unspecified: Secondary | ICD-10-CM | POA: Diagnosis not present

## 2015-06-08 LAB — COMPREHENSIVE METABOLIC PANEL
ALK PHOS: 63 U/L (ref 38–126)
ALT: 12 U/L — AB (ref 17–63)
AST: 18 U/L (ref 15–41)
Albumin: 3.9 g/dL (ref 3.5–5.0)
Anion gap: 8 (ref 5–15)
BUN: 30 mg/dL — AB (ref 6–20)
CALCIUM: 8.6 mg/dL — AB (ref 8.9–10.3)
CO2: 27 mmol/L (ref 22–32)
Chloride: 102 mmol/L (ref 101–111)
Creatinine, Ser: 1.56 mg/dL — ABNORMAL HIGH (ref 0.61–1.24)
GFR, EST AFRICAN AMERICAN: 43 mL/min — AB (ref >60)
GFR, EST NON AFRICAN AMERICAN: 37 mL/min — AB (ref >60)
Glucose, Bld: 118 mg/dL — ABNORMAL HIGH (ref 65–99)
Potassium: 4.3 mmol/L (ref 3.5–5.1)
SODIUM: 137 mmol/L (ref 135–145)
Total Bilirubin: 0.7 mg/dL (ref 0.3–1.2)
Total Protein: 6.4 g/dL — ABNORMAL LOW (ref 6.5–8.1)

## 2015-06-08 LAB — CBC WITH DIFFERENTIAL/PLATELET
BASOS PCT: 1 % (ref 0–1)
Basophils Absolute: 0 10*3/uL (ref 0.0–0.1)
EOS PCT: 2 % (ref 0–5)
Eosinophils Absolute: 0.1 10*3/uL (ref 0.0–0.7)
HCT: 36.2 % — ABNORMAL LOW (ref 39.0–52.0)
HEMOGLOBIN: 11.7 g/dL — AB (ref 13.0–17.0)
LYMPHS ABS: 0.8 10*3/uL (ref 0.7–4.0)
LYMPHS PCT: 12 % (ref 12–46)
MCH: 29.5 pg (ref 26.0–34.0)
MCHC: 32.3 g/dL (ref 30.0–36.0)
MCV: 91.2 fL (ref 78.0–100.0)
MONOS PCT: 7 % (ref 3–12)
Monocytes Absolute: 0.4 10*3/uL (ref 0.1–1.0)
NEUTROS ABS: 5.2 10*3/uL (ref 1.7–7.7)
NEUTROS PCT: 78 % — AB (ref 43–77)
Platelets: 149 10*3/uL — ABNORMAL LOW (ref 150–400)
RBC: 3.97 MIL/uL — AB (ref 4.22–5.81)
RDW: 14.9 % (ref 11.5–15.5)
WBC: 6.6 10*3/uL (ref 4.0–10.5)

## 2015-06-08 LAB — TSH: TSH: 0.269 u[IU]/mL — ABNORMAL LOW (ref 0.350–4.500)

## 2015-06-08 LAB — LACTATE DEHYDROGENASE: LDH: 129 U/L (ref 98–192)

## 2015-06-08 NOTE — Patient Instructions (Signed)
..  [place] at [facility] Discharge Instructions  RECOMMENDATIONS MADE BY THE CONSULTANT AND ANY TEST RESULTS WILL BE SENT TO YOUR REFERRING PHYSICIAN.  Referral to Dr. Benjamine Mola for hoarseness  Labs in 1 year and return in 1 year  Thank you for choosing [place] at [facility] to provide your oncology and hematology care.  To afford each patient quality time with our provider, please arrive at least 15 minutes before your scheduled appointment time.    You need to re-schedule your appointment should you arrive 10 or more minutes late.  We strive to give you quality time with our providers, and arriving late affects you and other patients whose appointments are after yours.  Also, if you no show three or more times for appointments you may be dismissed from the clinic at the providers discretion.     Again, thank you for choosing [facility].  Our hope is that these requests will decrease the amount of time that you wait before being seen by our physicians.       _____________________________________________________________  Should you have questions after your visit to [facility], please contact our office at [phone] between the hours of 8:30 a.m. and 4:30 p.m.  Voicemails left after 4:30 p.m. will not be returned until the following business day.  For prescription refill requests, have your pharmacy contact our office.

## 2015-06-08 NOTE — Telephone Encounter (Signed)
APPT TO SEE TEOH ON 7/19 @ 3;10

## 2015-06-08 NOTE — Assessment & Plan Note (Signed)
History of colon cancer in 1993 status post resection

## 2015-06-08 NOTE — Assessment & Plan Note (Addendum)
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 Armandina Gemma. On Synthroid replacement   Labs today and in 1 year: TSH, thyroglobulin, and thyroglobulin antibody

## 2015-06-08 NOTE — Assessment & Plan Note (Addendum)
Diffuse large B-cell lymphoma presented with hypercalcemia and extensive adenopathy within the chest and abdomen. He presented with biopsy-proven disease on 12/04/2006 and treated with R-CHOP chemotherapy x 6 cycles with complete remission.   Labs today and in 1 year: CBC diff, CMET, LDH  He notes hoarseness x 2 mo without improvement on antihistamines and 2 rounds of antibiotics by his primary care provider.  I will refer to ENT for further evaluation.  Return in 1 year for follow-up.

## 2015-06-08 NOTE — Progress Notes (Signed)
Duane Castaneda, [place] [address]  History of NHL (non-Hodgkin's lymphoma)  History of Primary thyroid follicular carcinoma  History of Colon cancer  CURRENT THERAPY: Observation per NCCN guidelines of NHL.  INTERVAL HISTORY: Duane Castaneda 81 y.o. male returns for  regular  visit for followup of:  1. Diffuse large B-cell lymphoma presented with hypercalcemia and extensive adenopathy within the chest and abdomen. He presented with biopsy-proven disease on 12/04/2006 and treated with R-CHOP chemotherapy x 6 cycles with complete remission.  2. 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 Armandina Gemma. On Synthroid replacement  3. History of colon cancer in 1993 status post resection  I personally reviewed and went over laboratory results with the patient.  The results are noted within this dictation.  His labs are stable.  Anemia is noted and stable.  No work-up needed at this time.   He denies any B symptoms.  He reports his appetite is strong.  He denies any abdominal complaints and chest pain.  He denies any SOB.    He reports a voice hoarseness x 2 mo that has been treated with antibiotics and anti-histamines by primary care provider  He notes that sometime, he cannot talk/speak.  I do not think this is malignancy related, but given this has been ongoing x 2 months, he deserves an ENT evaluation.  I will refer.  Past Medical History  Diagnosis Date  . History of colon cancer     Status post resection in 1993  . Essential hypertension, benign   . Degenerative joint disease   . Port catheter in place 08/21/2012  . Hypothyroidism   . Cataracts, bilateral   . Detached retina   . GERD (gastroesophageal reflux disease)   . Lymphoma     Diffuse large B cell status post R CHOP - Dr. Tressie Stalker, Remission since Dec 2007  . Follicular thyroid carcinoma     Dr. Harlow Asa  .  History of NHL (non-Hodgkin's lymphoma) 12/06/2013    Presented with Hypercalcemia and extensive adenopathy in chest and abdomen.  Biopsy proven on 12/04/2006.  S/P R-CHOP x 6 cycles with complete remission  . Primary thyroid follicular carcinoma 53/61/4431    S/P total thyroidectomy by Dr. Armandina Gemma.  On synthroid replacement.  . History of Colon cancer 12/06/2013    S/P resection in 1993  . History of Primary thyroid follicular carcinoma 54/00/8676    S/P total thyroidectomy by Dr. Armandina Gemma.  On synthroid replacement.     has OA (osteoarthritis) of knee; Port catheter in place; Preoperative evaluation to rule out surgical contraindication; Essential hypertension, benign; S/P total knee replacement; History of NHL (non-Hodgkin's lymphoma); History of Primary thyroid follicular carcinoma; and History of Colon cancer on his problem list.     is allergic to tramadol.  Mr. Fantroy does not currently have medications on file.  Past Surgical History  Procedure Laterality Date  . Detached retina left eye  [place]  . Bilateral cataract surgery  1988 Left, [place]  . Colon cancer resection  1993    Dr. Leontine Locket  . Lymphoma surgery  2007    Dr. Jory Ee  . Thyroid cancer surgery  2009    Dr. Theodis Sato  . Transurethral resection of prostate  2011    Dr. Alphonsa Gin  . Left knee replacement  2004  Dr. Amado Coe  . Hernia repair Bilateral 2000-Left, 2005-Right    Dr. Leontine Locket  . Total knee arthroplasty Right 02/08/2013    Procedure: TOTAL KNEE ARTHROPLASTY;  Surgeon: Carole Civil, MD;  Location: [facility];  Service: Orthopedics;  Laterality: Right;  . Joint replacement    . Total knee arthroplasty Left 02/2013    Dr Anselm Jungling  . Cholecystectomy      1993  . Colon surgery    . Eye surgery    . Port-a-cath removal Left 06/28/2013    Procedure: REMOVAL PORT-A-CATH;  Surgeon: Melrose Nakayama, MD;  Location: [facility];   Service: Thoracic;  Laterality: Left;    Denies any headaches, dizziness, double vision, fevers, chills, night sweats, nausea, vomiting, diarrhea, constipation, chest pain, heart palpitations, shortness of breath, blood in stool, black tarry stool, urinary pain, urinary burning, urinary frequency, hematuria.   PHYSICAL EXAMINATION  ECOG PERFORMANCE STATUS: 1  Filed Vitals:   06/08/15 1247  BP: 122/56  Pulse: 65  Temp: 98.3 F (36.8 C)  Resp: 16    GENERAL:alert, no distress, well nourished, well developed, comfortable, cooperative and smiling SKIN: skin color, texture, turgor are normal, no rashes or significant lesions HEAD: Normocephalic, No masses, lesions, tenderness or abnormalities EYES: normal, PERRLA, EOMI, Conjunctiva are pink and non-injected EARS: External ears normal OROPHARYNX:mucous membranes are moist  NECK: supple, no adenopathy, thyroid normal size, non-tender, without nodularity, no stridor, non-tender, trachea midline LYMPH:  no palpable lymphadenopathy BREAST:not examined LUNGS: clear to auscultation  HEART: regular rate & rhythm, no murmurs and no gallops ABDOMEN:abdomen soft, non-tender, normal bowel sounds and no masses or organomegaly BACK: Back symmetric, no curvature. EXTREMITIES:less then 2 second capillary refill, no joint deformities, effusion, or inflammation, no skin discoloration, no clubbing, no cyanosis  NEURO: alert & oriented x 3 with fluent speech, no focal motor/sensory deficits, gait normal   LABORATORY DATA: CBC    Component Value Date/Time   WBC 6.6 06/08/2015 1239   RBC 3.97* 06/08/2015 1239   HGB 11.7* 06/08/2015 1239   HCT 36.2* 06/08/2015 1239   PLT 149* 06/08/2015 1239   MCV 91.2 06/08/2015 1239   MCH 29.5 06/08/2015 1239   MCHC 32.3 06/08/2015 1239   RDW 14.9 06/08/2015 1239   LYMPHSABS 0.8 06/08/2015 1239   MONOABS 0.4 06/08/2015 1239   EOSABS 0.1 06/08/2015 1239   BASOSABS 0.0 06/08/2015 1239      Chemistry        Component Value Date/Time   NA 137 06/08/2015 1239   K 4.3 06/08/2015 1239   CL 102 06/08/2015 1239   CO2 27 06/08/2015 1239   BUN 30* 06/08/2015 1239   CREATININE 1.56* 06/08/2015 1239      Component Value Date/Time   CALCIUM 8.6* 06/08/2015 1239   ALKPHOS 63 06/08/2015 1239   AST 18 06/08/2015 1239   ALT 12* 06/08/2015 1239   BILITOT 0.7 06/08/2015 1239     Results for JOVONNI, BORQUEZ (MRN [id number]) as of 06/07/2014 13:19  Ref. Range 12/07/2013 14:41  [facility] Latest Range: 94-250 U/L 184     PENDING LABS: CBC diff, CMET, LDH, TSH, Thyroglobulin, Thyroglobulin antibody   ASSESSMENT/PLAN:   History of NHL (non-Hodgkin's lymphoma) Diffuse large B-cell lymphoma presented with hypercalcemia and extensive adenopathy within the chest and abdomen. He presented with biopsy-proven disease on 12/04/2006 and treated with R-CHOP chemotherapy x 6 cycles with complete remission.   Labs today and in 1 year: CBC diff, CMET, LDH  Return in 1  year for follow-up.  History of Primary thyroid follicular carcinoma Thyroid follicular carcinoma which is a variant of papillary thyroid carcinoma status post total thyroidectomy by Dr. Armandina Gemma. On Synthroid replacement   Labs today and in 1 year: TSH, thyroglobulin, and thyroglobulin antibody   History of Colon cancer History of colon cancer in 1993 status post resection    THERAPY PLAN:  We will follow NCCN guidelines for surveillance for NHL which is his biggest risk factor out of his 3 cancers he has had in the past.  NCCN guidelines recommends the follow surveillance for Stage I-IV NHL for those who attain a complete response to therapy:  A. H&P every 3-6 months for 5 years, then yearly or as clinically indicated.  B. Labs every 3-6 months for 5 years and then annually or as clinically indicated.  C. Repeat CT scans only as clinically indicated.   All questions were answered. The patient knows to call the clinic with any problems,  questions or concerns. We can certainly see the patient much sooner if necessary.  Patient and plan discussed with Dr. Ancil Linsey and she is in agreement with the aforementioned.    KEFALAS,THOMAS 06/08/2015

## 2015-06-09 ENCOUNTER — Other Ambulatory Visit (HOSPITAL_COMMUNITY): Payer: Self-pay

## 2015-06-09 LAB — THYROGLOBULIN ANTIBODY: Thyroglobulin Antibody: <1 IU/mL (ref 0.0–0.9)

## 2015-06-09 LAB — BETA 2 MICROGLOBULIN, SERUM: BETA 2 MICROGLOBULIN: 4.9 mg/L — AB (ref 0.6–2.4)

## 2015-06-09 MED ORDER — LEVOTHYROXINE SODIUM 125 MCG PO TABS: 125.0000 ug | ORAL_TABLET | Freq: Every day | ORAL | Status: DC

## 2015-06-09 NOTE — Progress Notes (Signed)
LABS DRAWN

## 2015-06-12 LAB — THYROGLOBULIN LEVEL

## 2015-06-27 DIAGNOSIS — R490 Dysphonia: Secondary | ICD-10-CM | POA: Diagnosis not present

## 2015-06-27 DIAGNOSIS — J382 Nodules of vocal cords: Secondary | ICD-10-CM | POA: Diagnosis not present

## 2015-06-28 ENCOUNTER — Other Ambulatory Visit: Payer: Self-pay | Admitting: Otolaryngology

## 2015-06-29 DIAGNOSIS — Z1389 Encounter for screening for other disorder: Secondary | ICD-10-CM | POA: Diagnosis not present

## 2015-06-29 DIAGNOSIS — R319 Hematuria, unspecified: Secondary | ICD-10-CM | POA: Diagnosis not present

## 2015-06-29 DIAGNOSIS — M545 Low back pain: Secondary | ICD-10-CM | POA: Diagnosis not present

## 2015-06-29 DIAGNOSIS — E663 Overweight: Secondary | ICD-10-CM | POA: Diagnosis not present

## 2015-06-29 DIAGNOSIS — Z6827 Body mass index (BMI) 27.0-27.9, adult: Secondary | ICD-10-CM | POA: Diagnosis not present

## 2015-06-30 ENCOUNTER — Encounter (HOSPITAL_BASED_OUTPATIENT_CLINIC_OR_DEPARTMENT_OTHER): Payer: Self-pay | Admitting: *Deleted

## 2015-07-03 ENCOUNTER — Encounter (HOSPITAL_BASED_OUTPATIENT_CLINIC_OR_DEPARTMENT_OTHER)
Admission: RE | Disposition: A | Discharge status: Home / Self Care | Payer: Self-pay | Source: Ambulatory Visit | Attending: Otolaryngology

## 2015-07-03 ENCOUNTER — Ambulatory Visit (HOSPITAL_BASED_OUTPATIENT_CLINIC_OR_DEPARTMENT_OTHER)
Admission: RE | Admit: 2015-07-03 | Discharge: 2015-07-03 | Disposition: A | Discharge status: Home / Self Care | Payer: Medicare Other | Source: Ambulatory Visit | Attending: Otolaryngology | Admitting: Otolaryngology

## 2015-07-03 ENCOUNTER — Ambulatory Visit (HOSPITAL_BASED_OUTPATIENT_CLINIC_OR_DEPARTMENT_OTHER): Payer: Medicare Other | Admitting: Anesthesiology

## 2015-07-03 ENCOUNTER — Encounter (HOSPITAL_BASED_OUTPATIENT_CLINIC_OR_DEPARTMENT_OTHER): Payer: Self-pay | Admitting: *Deleted

## 2015-07-03 DIAGNOSIS — D380 Neoplasm of uncertain behavior of larynx: Secondary | ICD-10-CM | POA: Diagnosis not present

## 2015-07-03 DIAGNOSIS — Z79899 Other long term (current) drug therapy: Secondary | ICD-10-CM | POA: Diagnosis not present

## 2015-07-03 DIAGNOSIS — C320 Malignant neoplasm of glottis: Secondary | ICD-10-CM | POA: Diagnosis not present

## 2015-07-03 DIAGNOSIS — Z8572 Personal history of non-Hodgkin lymphomas: Secondary | ICD-10-CM | POA: Diagnosis not present

## 2015-07-03 DIAGNOSIS — E039 Hypothyroidism, unspecified: Secondary | ICD-10-CM | POA: Insufficient documentation

## 2015-07-03 DIAGNOSIS — I10 Essential (primary) hypertension: Secondary | ICD-10-CM | POA: Insufficient documentation

## 2015-07-03 DIAGNOSIS — M1990 Unspecified osteoarthritis, unspecified site: Secondary | ICD-10-CM | POA: Diagnosis not present

## 2015-07-03 DIAGNOSIS — Z87891 Personal history of nicotine dependence: Secondary | ICD-10-CM | POA: Diagnosis not present

## 2015-07-03 DIAGNOSIS — Z85038 Personal history of other malignant neoplasm of large intestine: Secondary | ICD-10-CM | POA: Diagnosis not present

## 2015-07-03 DIAGNOSIS — K219 Gastro-esophageal reflux disease without esophagitis: Secondary | ICD-10-CM | POA: Diagnosis not present

## 2015-07-03 DIAGNOSIS — J381 Polyp of vocal cord and larynx: Secondary | ICD-10-CM | POA: Diagnosis not present

## 2015-07-03 DIAGNOSIS — R490 Dysphonia: Secondary | ICD-10-CM | POA: Diagnosis present

## 2015-07-03 HISTORY — DX: Malignant neoplasm of glottis: C32.0

## 2015-07-03 HISTORY — PX: MICROLARYNGOSCOPY: SHX5208

## 2015-07-03 SURGERY — MICROLARYNGOSCOPY
Anesthesia: General | Site: Throat | Laterality: Left

## 2015-07-03 MED ORDER — DEXAMETHASONE SODIUM PHOSPHATE 4 MG/ML IJ SOLN
INTRAMUSCULAR | Status: DC | PRN
  Administered 2015-07-03: 8 mg via INTRAVENOUS

## 2015-07-03 MED ORDER — PROMETHAZINE HCL 25 MG/ML IJ SOLN: 6.2500 mg | INTRAMUSCULAR | Status: DC | PRN

## 2015-07-03 MED ORDER — FENTANYL CITRATE (PF) 100 MCG/2ML IJ SOLN: 25.0000 ug | INTRAMUSCULAR | Status: DC | PRN

## 2015-07-03 MED ORDER — GLYCOPYRROLATE 0.2 MG/ML IJ SOLN: 0.2000 mg | Freq: Once | INTRAMUSCULAR | Status: DC | PRN

## 2015-07-03 MED ORDER — MEPERIDINE HCL 25 MG/ML IJ SOLN: 6.2500 mg | INTRAMUSCULAR | Status: DC | PRN

## 2015-07-03 MED ORDER — ONDANSETRON HCL 4 MG/2ML IJ SOLN
INTRAMUSCULAR | Status: DC | PRN
  Administered 2015-07-03: 4 mg via INTRAVENOUS

## 2015-07-03 MED ORDER — PROPOFOL 10 MG/ML IV BOLUS
INTRAVENOUS | Status: DC | PRN
  Administered 2015-07-03: 150 mg via INTRAVENOUS

## 2015-07-03 MED ORDER — HYDROCODONE-ACETAMINOPHEN 7.5-325 MG PO TABS: 1.0000 | ORAL_TABLET | Freq: Once | ORAL | Status: DC | PRN

## 2015-07-03 MED ORDER — MIDAZOLAM HCL 2 MG/2ML IJ SOLN: 1.0000 mg | INTRAMUSCULAR | Status: DC | PRN

## 2015-07-03 MED ORDER — FENTANYL CITRATE (PF) 100 MCG/2ML IJ SOLN
INTRAMUSCULAR | Status: AC
  Filled 2015-07-03: qty 2

## 2015-07-03 MED ORDER — EPINEPHRINE HCL 1 MG/ML IJ SOLN
INTRAMUSCULAR | Status: DC | PRN
  Administered 2015-07-03: 1 mg

## 2015-07-03 MED ORDER — EPINEPHRINE HCL 1 MG/ML IJ SOLN
INTRAMUSCULAR | Status: AC
  Filled 2015-07-03: qty 1

## 2015-07-03 MED ORDER — FENTANYL CITRATE (PF) 100 MCG/2ML IJ SOLN
50.0000 ug | INTRAMUSCULAR | Status: DC | PRN
  Administered 2015-07-03: 50 ug via INTRAVENOUS

## 2015-07-03 MED ORDER — LIDOCAINE HCL (CARDIAC) 20 MG/ML IV SOLN
INTRAVENOUS | Status: DC | PRN
  Administered 2015-07-03: 50 mg via INTRAVENOUS

## 2015-07-03 MED ORDER — LACTATED RINGERS IV SOLN
INTRAVENOUS | Status: DC
  Administered 2015-07-03 (×2): via INTRAVENOUS

## 2015-07-03 MED ORDER — SCOPOLAMINE 1 MG/3DAYS TD PT72: 1.0000 | MEDICATED_PATCH | Freq: Once | TRANSDERMAL | Status: DC | PRN

## 2015-07-03 MED ORDER — SUCCINYLCHOLINE CHLORIDE 20 MG/ML IJ SOLN
INTRAMUSCULAR | Status: DC | PRN
  Administered 2015-07-03: 100 mg via INTRAVENOUS

## 2015-07-03 SURGICAL SUPPLY — 20 items
CANISTER SUCT 1200ML W/VALVE (MISCELLANEOUS) ×3 IMPLANT
GLOVE BIO SURGEON STRL SZ7.5 (GLOVE) ×3 IMPLANT
GLOVE SURG SS PI 7.0 STRL IVOR (GLOVE) ×3 IMPLANT
GOWN STRL REUS W/ TWL LRG LVL3 (GOWN DISPOSABLE) ×1 IMPLANT
GOWN STRL REUS W/TWL LRG LVL3 (GOWN DISPOSABLE) ×2
GUARD TEETH (MISCELLANEOUS) IMPLANT
MARKER SKIN DUAL TIP RULER LAB (MISCELLANEOUS) IMPLANT
NEEDLE HYPO 18GX1.5 BLUNT FILL (NEEDLE) ×3 IMPLANT
NEEDLE SPNL 22GX7 QUINCKE BK (NEEDLE) IMPLANT
NS IRRIG 1000ML POUR BTL (IV SOLUTION) ×3 IMPLANT
PATTIES SURGICAL .5 X3 (DISPOSABLE) ×3 IMPLANT
SHEET MEDIUM DRAPE 40X70 STRL (DRAPES) ×3 IMPLANT
SLEEVE SCD COMPRESS KNEE MED (MISCELLANEOUS) ×3 IMPLANT
SOLUTION BUTLER CLEAR DIP (MISCELLANEOUS) ×3 IMPLANT
SPONGE GAUZE 4X4 12PLY STER LF (GAUZE/BANDAGES/DRESSINGS) ×3 IMPLANT
SYR CONTROL 10ML LL (SYRINGE) ×3 IMPLANT
SYR TB 1ML LL NO SAFETY (SYRINGE) IMPLANT
TOWEL OR 17X24 6PK STRL BLUE (TOWEL DISPOSABLE) ×3 IMPLANT
TUBE CONNECTING 20'X1/4 (TUBING) ×1
TUBE CONNECTING 20X1/4 (TUBING) ×2 IMPLANT

## 2015-07-03 NOTE — Op Note (Signed)
DATE OF PROCEDURE:  07/03/2015                              OPERATIVE REPORT  SURGEON:  Leta Baptist, MD  PREOPERATIVE DIAGNOSES: 1. Left vocal cord mass 2. Chronic hoarseness  POSTOPERATIVE DIAGNOSES: 1. Left vocal cord mass 2. Chronic hoarseness  PROCEDURE PERFORMED:  MicroDirect laryngoscopy with biopsy of the left vocal cord  ANESTHESIA:  General endotracheal tube anesthesia.  COMPLICATIONS:  None.  ESTIMATED BLOOD LOSS:  Minimal.  INDICATION FOR PROCEDURE:  Duane Castaneda is a 81 y.o. male with a history of chronic hoarseness. 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 Based on the above findings, the decision was made for the patient to undergo the above stated procedure. The risks, benefits, alternatives, and details of the procedure were discussed with the mother.  Questions were invited and answered.  Informed consent was obtained.  DESCRIPTION:  The patient was taken to the operating room and placed supine on the operating table.  General endotracheal tube anesthesia was administered by the anesthesiologist.  The patient was positioned and prepped and draped in the standard fashion for direct laryngoscopy. A Dedo laryngoscope was used for the examination. The laryngoscope was inserted via the oral cavity into the pharynx. The epiglottis, aryepiglottic folds, vallecula, and piriform sinuses were all noted to be normal. Examination of the vocal cords showed the left vocal cord to be covered with verrucous lesions. Photodocumentation of the examination was obtained. The Dedo laryngoscope was suspended with the Lewy suspender. A microscope was brought into the field. Under the operating microscope, multiple biopsy specimens were obtained from the left vocal cord. The specimens were sent to the pathology department for permanent histologic identification. The laryngoscope was removed. The care of the patient was turned over to the  anesthesiologist.  The patient was awakened from anesthesia without difficulty.  He was extubated and transferred to the recovery room in good condition.  OPERATIVE FINDINGS:  Verrucous lesions were noted to cover the left vocal cord.  SPECIMEN:  Left vocal cord biopsy specimens.  FOLLOWUP CARE:  The patient will be discharged home once awake and alert.  The patient will follow up in my office in approximately 1 week.  Eugenie Harewood,SUI W 07/03/2015 11:14 AM

## 2015-07-03 NOTE — H&P (Signed)
Cc: Hoarseness  HPI: The patient is a 81 y/o male who presents today with his daughter for evaluation of hoarseness. The patient noted onset of hoarseness about 2 months ago following bronchitis. He has noted some complete loss of voice especially with talking for an extended period of time. The patient denies any dysphagia, odynophagia, or cough. He has a history of reflux and is currently taking Nexium daily. The patient is also complaining of balance issues. His thyroid medication was recently adjusted and his daughter is wondering is if this is the cause of his imbalance. The patient denies any spinning sensation or positional dizziness. The patient quit smoking over 35 years ago. No other ENT, GI, or respiratory issue noted since the last visit.   Exam General: Communicates without difficulty, well nourished, no acute distress. Head: Normocephalic, no evidence injury, no tenderness, facial buttresses intact without stepoff. Eyes: PERRL, EOMI. No scleral icterus, conjunctivae clear. Neuro: CN II exam reveals vision grossly intact. No nystagmus at any point of gaze. Ears: Auricles well formed without lesions. EACs and TMs are normal. Nose: External evaluation reveals normal support and skin without lesions. Dorsum is intact. Anterior rhinoscopy reveals healthy pink mucosa over anterior aspect of inferior turbinates and intact septum. No purulence noted. Oral:  Oral cavity and oropharynx are intact, symmetric, without erythema or edema. Mucosa is moist without lesions. Neck: Full range of motion without pain. There is no significant lymphadenopathy. No masses palpable. Thyroid bed within normal limits to palpation. Parotid glands and submandibular glands equal bilaterally without mass. Trachea is midline. Neuro:  CN 2-12 grossly intact. Gait antalgic with cane. Vestibular: No nystagmus at any point of gaze. The cerebellar examination is unremarkable.   Procedure:  Flexible Fiberoptic Laryngoscopy -- Risks,  benefits, and alternatives of flexible endoscopy were explained to the patient.  Specific mention was made of the risk of throat numbness with difficulty swallowing, possible bleeding from the nose and mouth, and pain from the procedure.  The patient gave oral consent to proceed.  The nasal cavities were decongested and anesthetised with a combination of oxymetazoline and 4% lidocaine solution.  The flexible scope was inserted into the right nasal cavity and advanced towards the nasopharynx.  Visualized mucosa over the turbinates and septum were as described above.  The nasopharynx was clear.  Oropharyngeal walls were symmetric and mobile without lesion, mass, or edema.  Hypopharynx was also without  lesion or edema.  Larynx was mobile without lesions. Supraglottic structures were free of edema, mass, and asymmetry.  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  Base of tongue was within normal limits.  The patient tolerated the procedure well.   Assessment The patient is noted to have leukoplakia along the left vocal cord with some mucosal irregularity on today's laryngoscopy exam.  Plan  1. Laryngoscopy findings are discussed with the patient and his daughter. 2. Microdirect laryngoscopy with biopsy for further evaluation of the patient's left vocal cord lesion. The risks, benefits, alternatives, and details of the procedure are reviewed with the patient. Questions are invited and answered.

## 2015-07-03 NOTE — Transfer of Care (Signed)
Immediate Anesthesia Transfer of Care Note  Patient: Duane Castaneda  Procedure(s) Performed: Procedure(s): MICROLARYNGOSCOPY WITH BIOPSY (Left)  Patient Location: PACU  Anesthesia Type:General  Level of Consciousness: awake, alert  and oriented  Airway & Oxygen Therapy: Patient Spontanous Breathing and Patient connected to face mask oxygen  Post-op Assessment: Report given to RN and Post -op Vital signs reviewed and stable  Post vital signs: Reviewed and stable  Last Vitals:  Filed Vitals:   07/03/15 1120  Pulse: 67  Temp:   Resp: 30    Complications: No apparent anesthesia complications

## 2015-07-03 NOTE — Anesthesia Preprocedure Evaluation (Addendum)
Anesthesia Evaluation  Patient identified by MRN, date of birth, ID band Patient awake    Reviewed: Allergy & Precautions, H&P , NPO status , Patient's Chart, lab work & pertinent test results, reviewed documented beta blocker date and time   Airway Mallampati: II   Neck ROM: full    Dental  (+) Teeth Intact, Dental Advisory Given   Pulmonary former smoker,  breath sounds clear to auscultation        Cardiovascular hypertension, Pt. on medications and Pt. on home beta blockers negative cardio ROS  Rhythm:Regular Rate:Normal     Neuro/Psych negative neurological ROS  negative psych ROS   GI/Hepatic Neg liver ROS, GERD-  Medicated,H/o colon CA   Endo/Other  negative endocrine ROSHypothyroidism   Renal/GU negative Renal ROS     Musculoskeletal negative musculoskeletal ROS (+) Arthritis -,   Abdominal   Peds  Hematology negative hematology ROS (+) Lymphoma now in remission   Anesthesia Other Findings   Reproductive/Obstetrics                            Anesthesia Physical  Anesthesia Plan  ASA: III  Anesthesia Plan: General   Post-op Pain Management:    Induction: Intravenous  Airway Management Planned: Oral ETT  Additional Equipment:   Intra-op Plan:   Post-operative Plan: Extubation in OR  Informed Consent: I have reviewed the patients History and Physical, chart, labs and discussed the procedure including the risks, benefits and alternatives for the proposed anesthesia with the patient or authorized representative who has indicated his/her understanding and acceptance.   Dental advisory given  Plan Discussed with: CRNA  Anesthesia Plan Comments:         Anesthesia Quick Evaluation

## 2015-07-03 NOTE — Anesthesia Procedure Notes (Signed)
Procedure Name: Intubation Date/Time: 07/03/2015 10:54 AM Performed by: Melynda Ripple D Pre-anesthesia Checklist: Patient identified, Emergency Drugs available, Suction available and Patient being monitored Patient Re-evaluated:Patient Re-evaluated prior to inductionOxygen Delivery Method: Circle System Utilized Preoxygenation: Pre-oxygenation with 100% oxygen Intubation Type: IV induction Ventilation: Mask ventilation without difficulty Laryngoscope Size: Mac and 3 Grade View: Grade I Tube type: Oral Number of attempts: 1 Airway Equipment and Method: Stylet and Oral airway Placement Confirmation: ETT inserted through vocal cords under direct vision,  positive ETCO2 and breath sounds checked- equal and bilateral Tube secured with: Tape Dental Injury: Teeth and Oropharynx as per pre-operative assessment

## 2015-07-03 NOTE — Anesthesia Postprocedure Evaluation (Signed)
  Anesthesia Post-op Note  Patient: Duane Castaneda  Procedure(s) Performed: Procedure(s): MICROLARYNGOSCOPY WITH BIOPSY (Left)  Patient Location: PACU  Anesthesia Type: General   Level of Consciousness: awake, alert  and oriented  Airway and Oxygen Therapy: Patient Spontanous Breathing  Post-op Pain: none  Post-op Assessment: Post-op Vital signs reviewed  Post-op Vital Signs: Reviewed  Last Vitals:  Filed Vitals:   07/03/15 1220  BP: 162/61  Pulse: 69  Temp: 37.2 C  Resp: 16    Complications: No apparent anesthesia complications

## 2015-07-03 NOTE — Discharge Instructions (Addendum)
Laryngoscopy A laryngoscopy is a procedure performed to view the back of the throat, vocal cords, and voice box (larynx). It may be done in order to figure out why a person has:  A cough.  Voice changes, such as new hoarseness.  Throat pain.  Problems swallowing. During a laryngoscopy, tissue samples (biopsies) can be taken or foreign bodies can be removed. A laryngoscopy can be done in the caregiver's office. It may be performed with a hand-held mirror, or it may require the use of a fiberoptic scope. Under some circumstances, it may be done in a hospital with medicine to help you sleep (general anesthesia).  AFTER THE PROCEDURE   When laryngoscopy is done with only local numbing, it usually does not require any changes to your activity level after the procedure is complete.  You may have a sore throat.  You may be asked to rest the voice for some length of time after the procedure.  Do not smoke.  Follow your caregiver's directions regarding eating and drinking after the procedure.  If you had a biopsy taken, your caregiver may advise trying to avoid coughing, whispering, or clearing the throat.  If you were given a general anesthetic or a medicine to help you relax (sedative), you will be sleepy.  There may be some pain or you may feel sick to your stomach (nauseous), but this can usually be controlled with medicines taken by mouth.  You will stay in the recovery room until awake and able to drink fluids.  You can go back to his or her usual level of activity within several days. HOME CARE INSTRUCTIONS   Take all medicines exactly as directed.  Follow any prescribed diet.  Follow instructions regarding rest (including voice rest) and physical activity.  Follow instructions for the use of throat lozenges or gargles. SEEK IMMEDIATE MEDICAL CARE IF:   You have severe pain.  You develop new problems with swallowing.  You have difficulty breathing or have shortness of  breath.  You have chest pain. MAKE SURE YOU:   Understand these instructions.  Will watch your condition.  Will get help right away if you are not doing well or get worse. Document Released: 02/19/2010 Document Revised: 04/11/2014 Document Reviewed: 02/19/2010 [facility] Patient Information 2015 [place], [place]. This information is not intended to replace advice given to you by your health care provider. Make sure you discuss any questions you have with your health care provider.  Post Anesthesia Home Care Instructions  Activity: Get plenty of rest for the remainder of the day. A responsible adult should stay with you for 24 hours following the procedure.  For the next 24 hours, DO NOT: -Drive a car -Paediatric nurse -Drink alcoholic beverages -Take any medication unless instructed by your physician -Make any legal decisions or sign important papers.  Meals: Start with liquid foods such as gelatin or soup. Progress to regular foods as tolerated. Avoid greasy, spicy, heavy foods. If nausea and/or vomiting occur, drink only clear liquids until the nausea and/or vomiting subsides. Call your physician if vomiting continues.  Special Instructions/Symptoms: Your throat may feel dry or sore from the anesthesia or the breathing tube placed in your throat during surgery. If this causes discomfort, gargle with warm salt water. The discomfort should disappear within 24 hours.  If you had a scopolamine patch placed behind your ear for the management of post- operative nausea and/or vomiting:  1. The medication in the patch is effective for 72 hours, after which it  should be removed.  Wrap patch in a tissue and discard in the trash. Wash hands thoroughly with soap and water. 2. You may remove the patch earlier than 72 hours if you experience unpleasant side effects which may include dry mouth, dizziness or visual disturbances. 3. Avoid touching the patch. Wash your hands with soap and water after  contact with the patch.

## 2015-07-04 ENCOUNTER — Encounter (HOSPITAL_BASED_OUTPATIENT_CLINIC_OR_DEPARTMENT_OTHER): Payer: Self-pay | Admitting: Otolaryngology

## 2015-07-10 DIAGNOSIS — C329 Malignant neoplasm of larynx, unspecified: Secondary | ICD-10-CM | POA: Diagnosis not present

## 2015-07-11 ENCOUNTER — Encounter (HOSPITAL_COMMUNITY): Payer: Self-pay | Admitting: Hematology & Oncology

## 2015-07-11 ENCOUNTER — Encounter (HOSPITAL_COMMUNITY): Payer: Self-pay | Admitting: Lab

## 2015-07-11 ENCOUNTER — Encounter (HOSPITAL_COMMUNITY): Payer: Medicare Other | Attending: Oncology | Admitting: Hematology & Oncology

## 2015-07-11 VITALS — BP 144/71 | HR 64 | Temp 98.7°F | Resp 15 | Wt 190.1 lb

## 2015-07-11 DIAGNOSIS — C320 Malignant neoplasm of glottis: Secondary | ICD-10-CM | POA: Diagnosis not present

## 2015-07-11 DIAGNOSIS — C8590 Non-Hodgkin lymphoma, unspecified, unspecified site: Secondary | ICD-10-CM | POA: Diagnosis not present

## 2015-07-11 DIAGNOSIS — C73 Malignant neoplasm of thyroid gland: Secondary | ICD-10-CM

## 2015-07-11 DIAGNOSIS — C189 Malignant neoplasm of colon, unspecified: Secondary | ICD-10-CM | POA: Insufficient documentation

## 2015-07-11 NOTE — Progress Notes (Signed)
Referral to [facility] [place].  Referral sent to [facility].   Records faxed on 8/2

## 2015-07-11 NOTE — Patient Instructions (Signed)
[place] at [facility] Discharge Instructions  RECOMMENDATIONS MADE BY THE CONSULTANT AND ANY TEST RESULTS WILL BE SENT TO YOUR REFERRING PHYSICIAN.  Exam and discussion by Dr. Whitney Muse Will make a referral to for a consultation with Dr. Lanell Persons at [facility]. They will contact you with the date and time of the appointment. We will see you back in 2 - 3 weeks for follow-up.   Thank you for choosing [place] at [facility] to provide your oncology and hematology care.  To afford each patient quality time with our provider, please arrive at least 15 minutes before your scheduled appointment time.    You need to re-schedule your appointment should you arrive 10 or more minutes late.  We strive to give you quality time with our providers, and arriving late affects you and other patients whose appointments are after yours.  Also, if you no show three or more times for appointments you may be dismissed from the clinic at the providers discretion.     Again, thank you for choosing [facility].  Our hope is that these requests will decrease the amount of time that you wait before being seen by our physicians.       _____________________________________________________________  Should you have questions after your visit to [facility], please contact our office at [phone] between the hours of 8:30 a.m. and 4:30 p.m.  Voicemails left after 4:30 p.m. will not be returned until the following business day.  For prescription refill requests, have your pharmacy contact our office.

## 2015-07-11 NOTE — Progress Notes (Signed)
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 Per available records and after talking to Dr. Benjamine Mola the lesion is early however of significant size and the patient is referred to [place] today for additional discussion.  The patient goes by both Duane Castaneda and Duane Castaneda.  He is here with his daughter who is also his care giver.  They live close to [place].  The patient has had Bronchitis, mild pneumonia in his left lung, his antibiotics switched twice, and has been hoarse since April.  He was referred by PA Rowan Blase to Dr. Benjamine Mola for further evaluation. He notes that since resection of the lesion on his vocal cord his voice has improved. He states they discussed getting scans and possible radiation.  He is anemic, this is chronic and mild.  His appetite is well.  He has a history of non-Hodgkin's lymphoma, primary thyroid follicular carcinoma, and colon cancer. He is known to the clinic here. And unfortunately is being seen today for a new malignancy.   MEDICAL HISTORY:  Past Medical History  Diagnosis Date  . History of colon cancer     Status post resection in 1993  . Essential hypertension, benign   . Degenerative joint disease   . Port catheter in place 08/21/2012  . Hypothyroidism   . Cataracts, bilateral   . Detached retina   . GERD (gastroesophageal reflux disease)   . Lymphoma     Diffuse large B cell status post R CHOP - Dr. Tressie Stalker, Remission since Dec 2007  .  Follicular thyroid carcinoma     Dr. Harlow Asa  . History of NHL (non-Hodgkin's lymphoma) 12/06/2013    Presented with Hypercalcemia and extensive adenopathy in chest and abdomen.  Biopsy proven on 12/04/2006.  S/P R-CHOP x 6 cycles with complete remission  . Primary thyroid follicular carcinoma 03/55/9741    S/P total thyroidectomy by Dr. Armandina Gemma.  On synthroid replacement.  . History of Colon cancer 12/06/2013    S/P resection in 1993  . History of Primary thyroid follicular carcinoma 63/84/5364    S/P total thyroidectomy by Dr. Armandina Gemma.  On synthroid replacement.   . Squamous cell carcinoma of left vocal cord 07/03/15    SURGICAL HISTORY: Past Surgical History  Procedure Laterality Date  . Detached retina left eye  [place]  . Bilateral cataract surgery  1988 Left, [place]  . Colon cancer resection  1993    Dr. Leontine Locket  . Lymphoma surgery  2007    Dr. Jory Ee  . Thyroid cancer surgery  2009    Dr. Theodis Sato  . Transurethral resection of prostate  2011    Dr. Alphonsa Gin  . Left knee replacement  2004    Dr. Amado Coe  . Hernia repair Bilateral 2000-Left, 2005-Right    Dr. Leontine Locket  . Total knee arthroplasty Right 02/08/2013    Procedure: TOTAL KNEE ARTHROPLASTY;  Surgeon: Carole Civil, MD;  Location: [facility];  Service: Orthopedics;  Laterality: Right;  . Joint replacement    . Total knee arthroplasty Left 02/2013    Dr Anselm Jungling  . Cholecystectomy      1993  . Colon surgery    . Eye surgery    . Port-a-cath removal Left 06/28/2013    Procedure: REMOVAL PORT-A-CATH;  Surgeon: Melrose Nakayama, MD;  Location: [facility];  Service: Thoracic;  Laterality: Left;  Marland Kitchen Microlaryngoscopy Left 07/03/2015    Procedure: MICROLARYNGOSCOPY WITH BIOPSY;  Surgeon: Leta Baptist, MD;  Location: [facility];  Service: ENT;  Laterality: Left;    SOCIAL HISTORY: History   Social History  . Marital Status: Divorced     Spouse Name: N/A  . Number of Children: N/A  . Years of Education: 12th grade   Occupational History  . Retired    Social History Main Topics  . Smoking status: Former Smoker -- 1.00 packs/day for 15 years    Types: Cigarettes  . Smokeless tobacco: Never Used     Comment: quit smoking 33 years ago  . Alcohol Use: No  . Drug Use: No  . Sexual Activity: Not on file   Other Topics Concern  . Not on file   Social History Narrative  Divorced and widowed. 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 Arthritis    . Lung disease    . Leukemia Mother   . Lung cancer Father   . Cancer Sister   . Cancer Brother    indicated that his mother is deceased. He indicated that his father is deceased. He indicated that his sister is deceased. He indicated that his brother is deceased.  Father deceased, 22, lung cancer (heavy cancer) Mother deceased, 69, leukemia 1 brother, 1 sister, both deceased  ALLERGIES:  is allergic to tramadol.  MEDICATIONS:  Current Outpatient Prescriptions  Medication Sig Dispense Refill  . allopurinol (ZYLOPRIM) 300 MG tablet Take 300 mg by mouth daily.      Marland Kitchen aspirin 81 MG tablet Take 81 mg by mouth every other day.     . bisoprolol-hydrochlorothiazide (ZIAC) 2.5-6.25 MG per tablet Take 1 tablet by mouth every other day.     . levothyroxine (SYNTHROID, LEVOTHROID) 125 MCG tablet Take 1 tablet (125 mcg total) by mouth daily. 30 tablet 0  . lisinopril (PRINIVIL,ZESTRIL) 10 MG tablet Take 10 mg by mouth daily.     . Multiple Vitamin (MULTIVITAMIN) capsule Take 1 capsule by mouth daily.      Marland Kitchen omeprazole (PRILOSEC) 20 MG capsule Take 20 mg by mouth daily.     Marland Kitchen terazosin (HYTRIN) 10 MG capsule Take 10 mg by mouth at bedtime.      No current  facility-administered medications for this visit.   Facility-Administered Medications Ordered in Other Visits  Medication Dose Route Frequency Provider Last Rate Last Dose  . sodium chloride 0.9 % injection 10 mL  10 mL Intravenous PRN Baird Cancer, PA-C   10 mL at 06/14/13 1445    Review of Systems  All other systems reviewed and are negative.  14 point ROS was done and is otherwise as detailed above or in HPI  PHYSICAL EXAMINATION: ECOG PERFORMANCE STATUS: 1 - Symptomatic but completely ambulatory  Filed Vitals:   07/11/15 1400  BP: 144/71  Pulse: 64  Temp: 98.7 F (37.1 C)  Resp: 15   Filed Weights   07/11/15 1400  Weight: 190 lb 1.6 oz (86.229 kg)   Physical Exam  Constitutional: He is oriented to person, place, and time and well-developed, well-nourished, and in no distress.  HENT:  Head: Normocephalic and atraumatic.  Right Ear: External ear normal.  Left Ear: External ear normal.  Nose: Nose normal.  Mouth/Throat: Oropharynx is clear and moist. No oropharyngeal exudate.  Eyes: Conjunctivae and EOM are normal. Pupils are equal, round, and reactive to light. Right eye exhibits no discharge. Left eye exhibits no discharge. No scleral icterus.  Neck: Normal range of motion. Neck supple. No tracheal deviation present. No thyromegaly present.  Cardiovascular: Normal rate, regular rhythm, normal heart sounds and intact distal pulses.  Exam reveals no gallop and no friction rub.   No murmur heard. Pulmonary/Chest: Effort normal and breath sounds normal. He has no wheezes. He has no rales.  Abdominal: Soft. Bowel sounds are normal. He exhibits no distension and no mass. There is no tenderness. There is no rebound and no guarding.  Musculoskeletal: Normal range of motion. He exhibits no edema.  Lymphadenopathy:    He has no cervical adenopathy.  Neurological: He is alert and oriented to person, place, and time. He has normal reflexes. No cranial nerve deficit. Gait  normal. Coordination normal.  Skin: Skin is warm and dry. No rash noted.  R elbow, non-healing skin lesion  Psychiatric: Mood, memory, affect and judgment normal.  Nursing note and vitals reviewed.   LABORATORY DATA:  I have reviewed the data as listed Lab Results  Component Value Date   WBC 6.6 06/08/2015   HGB 11.7* 06/08/2015   HCT 36.2* 06/08/2015   MCV 91.2 06/08/2015   PLT 149* 06/08/2015     RADIOGRAPHIC STUDIES: I have personally reviewed the radiological images as listed and agreed with the findings in the report.  CLINICAL DATA: Four days of productive cough, less productive today, history of colonic malignancy, hypertension, and previous tobacco use.  EXAM: CHEST 2 VIEW  COMPARISON: Chest x-ray of July 01, 2014 and [place] and lateral chest x-ray of July 13, 2013 peer  FINDINGS: The lungs are hyperinflated with hemidiaphragm flattening. There are coarse lung markings in the mid and lower lung zones which are stable. There is no alveolar infiltrate. There is chronic blunting of the right lateral and posterior costophrenic angles. The heart is top-normal in size. The pulmonary vascularity is normal. The trachea is midline. The bony thorax exhibits no acute abnormality.  IMPRESSION: COPD. Chronic basilar scarring greater on the right than on the left. There is no pneumonia nor CHF.   Electronically Signed  By: David Martinique  On: 03/13/2015 11:25    ASSESSMENT & PLAN:  Squamous cell carcinoma of the true vocal cord History of diffuse large B-cell lymphoma History of thyroid follicular carcinoma History of colon cancer Mild anemia  I have discussed with the patient and his daughter that I do agree he needs radiation. I have discussed his case with both Dr. Isidore Moos and Dr. Benjamine Mola. I do feel that he could most likely tolerate radiation and I expressed this to Dr. Isidore Moos today. She agrees to meet with the patient in the next week for additional  consultation.  I currently do not feel additional imaging is required. I discussed this with the patient and his daughter in detail and they are comfortable with that suggestion.  We will plan on seeing him  in several weeks to see how he is doing. We will continue with ongoing observation of his other medical issues.  All questions were answered. The patient knows to call the clinic with any problems, questions or concerns.   This document serves as a record of services personally performed by Ancil Linsey, MD. It was created on her behalf by Janace Hoard, a trained medical scribe. The creation of this record is based on the scribe's personal observations and the provider's statements to them. This document has been checked and approved by the attending provider.  I have reviewed the above documentation for accuracy and completeness, and I agree with the above.  This note was electronically signed.  Kelby Fam. Whitney Muse, MD

## 2015-07-18 DIAGNOSIS — Z85850 Personal history of malignant neoplasm of thyroid: Secondary | ICD-10-CM | POA: Diagnosis not present

## 2015-07-18 DIAGNOSIS — Z85038 Personal history of other malignant neoplasm of large intestine: Secondary | ICD-10-CM | POA: Diagnosis not present

## 2015-07-18 DIAGNOSIS — Z885 Allergy status to narcotic agent status: Secondary | ICD-10-CM | POA: Diagnosis not present

## 2015-07-18 DIAGNOSIS — C320 Malignant neoplasm of glottis: Secondary | ICD-10-CM | POA: Diagnosis not present

## 2015-07-18 DIAGNOSIS — Z510 Encounter for antineoplastic radiation therapy: Secondary | ICD-10-CM | POA: Diagnosis not present

## 2015-07-18 DIAGNOSIS — Z79899 Other long term (current) drug therapy: Secondary | ICD-10-CM | POA: Diagnosis not present

## 2015-07-18 DIAGNOSIS — M1990 Unspecified osteoarthritis, unspecified site: Secondary | ICD-10-CM | POA: Diagnosis not present

## 2015-07-18 DIAGNOSIS — Z8572 Personal history of non-Hodgkin lymphomas: Secondary | ICD-10-CM | POA: Diagnosis not present

## 2015-07-18 DIAGNOSIS — E039 Hypothyroidism, unspecified: Secondary | ICD-10-CM | POA: Diagnosis not present

## 2015-07-18 DIAGNOSIS — Z7982 Long term (current) use of aspirin: Secondary | ICD-10-CM | POA: Diagnosis not present

## 2015-07-18 DIAGNOSIS — Z87891 Personal history of nicotine dependence: Secondary | ICD-10-CM | POA: Diagnosis not present

## 2015-07-18 DIAGNOSIS — I10 Essential (primary) hypertension: Secondary | ICD-10-CM | POA: Diagnosis not present

## 2015-07-18 DIAGNOSIS — Z809 Family history of malignant neoplasm, unspecified: Secondary | ICD-10-CM | POA: Diagnosis not present

## 2015-07-24 ENCOUNTER — Encounter (HOSPITAL_COMMUNITY): Payer: Self-pay | Admitting: Oncology

## 2015-07-24 ENCOUNTER — Encounter (HOSPITAL_BASED_OUTPATIENT_CLINIC_OR_DEPARTMENT_OTHER): Payer: Medicare Other | Admitting: Oncology

## 2015-07-24 VITALS — BP 156/61 | HR 93 | Temp 97.9°F | Resp 16 | Wt 189.2 lb

## 2015-07-24 DIAGNOSIS — C320 Malignant neoplasm of glottis: Secondary | ICD-10-CM | POA: Insufficient documentation

## 2015-07-24 HISTORY — DX: Malignant neoplasm of glottis: C32.0

## 2015-07-24 NOTE — Patient Instructions (Signed)
[facility] at [facility] Discharge Instructions  RECOMMENDATIONS MADE BY THE CONSULTANT AND ANY TEST RESULTS WILL BE SENT TO YOUR REFERRING PHYSICIAN.  Follow-up in 5-6 weeks with Dr.Penland. Treatment with radiation as scheduled.  Thank you for choosing [facility] at [facility] to provide your oncology and hematology care.  To afford each patient quality time with our provider, please arrive at least 15 minutes before your scheduled appointment time.    You need to re-schedule your appointment should you arrive 10 or more minutes late.  We strive to give you quality time with our providers, and arriving late affects you and other patients whose appointments are after yours.  Also, if you no show three or more times for appointments you may be dismissed from the clinic at the providers discretion.     Again, thank you for choosing [facility].  Our hope is that these requests will decrease the amount of time that you wait before being seen by our physicians.       _____________________________________________________________  Should you have questions after your visit to [facility], please contact our office at [phone] between the hours of 8:30 a.m. and 4:30 p.m.  Voicemails left after 4:30 p.m. will not be returned until the following business day.  For prescription refill requests, have your pharmacy contact our office.

## 2015-07-24 NOTE — Progress Notes (Signed)
[place], [place] [id number]  Squamous Cell Cancer, true vocal cords  CURRENT THERAPY: XRT alone  INTERVAL HISTORY: Duane Castaneda 81 y.o. male returns for followup of Stage II squamous cell carcinoma of left true vocal cord. AND 1. Diffuse large B-cell lymphoma presented with hypercalcemia and extensive adenopathy within the chest and abdomen. He presented with biopsy-proven disease on 12/04/2006 and treated with R-CHOP chemotherapy x 6 cycles with complete remission.  2. 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 Armandina Gemma. On Synthroid replacement  3. History of colon cancer in 1993 status post resection    Squamous Cell Cancer, true vocal cord (left)   07/03/2015 Pathology Results Vocal cord, biopsy, left - SQUAMOUS CELL CARCINOMA.   07/24/2015 Initial Diagnosis Squamous Cell Cancer, true vocal cord (left)   He starts radiation therapy tomorrow for his newly discovered squamous cell carcinoma of true vocal cord.  This is being managed by Dr. Isidore Moos.  He meets with nutritionist at [facility] tomorrow as well.  I have made minor recommendations regarding ensure use which can be added to ice cream and blended for a milkshake, etc.  He denies any complaints and he is ready to start radiation.   Past Medical History  Diagnosis Date  . History of colon cancer     Status post resection in 1993  . Essential hypertension, benign   . Degenerative joint disease   . Port catheter in place 08/21/2012  . Hypothyroidism   . Cataracts, bilateral   . Detached retina   . GERD (gastroesophageal reflux disease)   . Lymphoma     Diffuse large B cell status post R CHOP - Dr. Tressie Stalker, Remission since Dec 2007  . Follicular thyroid carcinoma     Dr. Harlow Asa  . History of NHL (non-Hodgkin's lymphoma) 12/06/2013    Presented with Hypercalcemia and extensive  adenopathy in chest and abdomen.  Biopsy proven on 12/04/2006.  S/P R-CHOP x 6 cycles with complete remission  . Primary thyroid follicular carcinoma 81/44/8185    S/P total thyroidectomy by Dr. Armandina Gemma.  On synthroid replacement.  . History of Colon cancer 12/06/2013    S/P resection in 1993  . History of Primary thyroid follicular carcinoma 63/14/9702    S/P total thyroidectomy by Dr. Armandina Gemma.  On synthroid replacement.   . Squamous cell carcinoma of left vocal cord 07/03/15  . Squamous Cell Cancer, true vocal cords 07/24/2015    has OA (osteoarthritis) of knee; Port catheter in place; Preoperative evaluation to rule out surgical contraindication; Essential hypertension, benign; S/P total knee replacement; History of NHL (non-Hodgkin's lymphoma); History of Primary thyroid follicular carcinoma; History of Colon cancer; and Squamous Cell Cancer, true vocal cord (left) on his problem list.     is allergic to tramadol.  Current Outpatient Prescriptions on File Prior to Visit  Medication Sig Dispense Refill  . allopurinol (ZYLOPRIM) 300 MG tablet Take 300 mg by mouth daily.      Marland Kitchen aspirin 81 MG tablet Take 81 mg by mouth every other day.     . bisoprolol-hydrochlorothiazide (ZIAC) 2.5-6.25 MG per tablet Take 1 tablet by mouth every other day.     . levothyroxine (SYNTHROID, LEVOTHROID) 125 MCG tablet Take 1 tablet (125 mcg total) by mouth daily. 30 tablet 0  . lisinopril (PRINIVIL,ZESTRIL) 10 MG tablet Take 10 mg by mouth daily.     Marland Kitchen  Multiple Vitamin (MULTIVITAMIN) capsule Take 1 capsule by mouth daily.      Marland Kitchen omeprazole (PRILOSEC) 20 MG capsule Take 20 mg by mouth daily.     Marland Kitchen terazosin (HYTRIN) 10 MG capsule Take 10 mg by mouth at bedtime.      Current Facility-Administered Medications on File Prior to Visit  Medication Dose Route Frequency Provider Last Rate Last Dose  . sodium chloride 0.9 % injection 10 mL  10 mL Intravenous PRN Baird Cancer, PA-C   10 mL at 06/14/13 1445     Past Surgical History  Procedure Laterality Date  . Detached retina left eye  [place]  . Bilateral cataract surgery  1988 Left, [place]  . Colon cancer resection  1993    Dr. Leontine Locket  . Lymphoma surgery  2007    Dr. Jory Ee  . Thyroid cancer surgery  2009    Dr. Theodis Sato  . Transurethral resection of prostate  2011    Dr. Alphonsa Gin  . Left knee replacement  2004    Dr. Amado Coe  . Hernia repair Bilateral 2000-Left, 2005-Right    Dr. Leontine Locket  . Total knee arthroplasty Right 02/08/2013    Procedure: TOTAL KNEE ARTHROPLASTY;  Surgeon: Carole Civil, MD;  Location: [facility];  Service: Orthopedics;  Laterality: Right;  . Joint replacement    . Total knee arthroplasty Left 02/2013    Dr Anselm Jungling  . Cholecystectomy      1993  . Colon surgery    . Eye surgery    . Port-a-cath removal Left 06/28/2013    Procedure: REMOVAL PORT-A-CATH;  Surgeon: Melrose Nakayama, MD;  Location: [facility];  Service: Thoracic;  Laterality: Left;  Marland Kitchen Microlaryngoscopy Left 07/03/2015    Procedure: MICROLARYNGOSCOPY WITH BIOPSY;  Surgeon: Leta Baptist, MD;  Location: [facility];  Service: ENT;  Laterality: Left;    Denies any headaches, dizziness, double vision, fevers, chills, night sweats, nausea, vomiting, diarrhea, constipation, chest pain, heart palpitations, shortness of breath, blood in stool, black tarry stool, urinary pain, urinary burning, urinary frequency, hematuria.   PHYSICAL EXAMINATION  ECOG PERFORMANCE STATUS: 1 - Symptomatic but completely ambulatory  Filed Vitals:   07/24/15 1317  BP: 156/61  Pulse: 93  Temp: 97.9 F (36.6 C)  Resp: 16    GENERAL:alert, no distress, well nourished, well developed, comfortable, cooperative, smiling and accompanied by caretaker. SKIN: skin color, texture, turgor are normal, no rashes or significant lesions HEAD: Normocephalic, No masses, lesions, tenderness or  abnormalities EYES: normal, PERRLA, EOMI, Conjunctiva are pink and non-injected EARS: External ears normal OROPHARYNX:lips, buccal mucosa, and tongue normal and mucous membranes are moist  NECK: supple, no adenopathy, thyroid normal size, non-tender, without nodularity, trachea midline LYMPH:  no palpable lymphadenopathy, no hepatosplenomegaly BREAST:not examined LUNGS: clear to auscultation and percussion HEART: regular rate & rhythm ABDOMEN:abdomen soft and normal bowel sounds BACK: Back symmetric, no curvature. EXTREMITIES:less then 2 second capillary refill, no joint deformities, effusion, or inflammation, no skin discoloration, no cyanosis  NEURO: alert & oriented x 3 with fluent speech, no focal motor/sensory deficits, gait normal    LABORATORY DATA: CBC    Component Value Date/Time   WBC 6.6 06/08/2015 1239   RBC 3.97* 06/08/2015 1239   HGB 11.7* 06/08/2015 1239   HCT 36.2* 06/08/2015 1239   PLT 149* 06/08/2015 1239   MCV 91.2 06/08/2015 1239   MCH 29.5 06/08/2015 1239  MCHC 32.3 06/08/2015 1239   RDW 14.9 06/08/2015 1239   LYMPHSABS 0.8 06/08/2015 1239   MONOABS 0.4 06/08/2015 1239   EOSABS 0.1 06/08/2015 1239   BASOSABS 0.0 06/08/2015 1239      Chemistry      Component Value Date/Time   NA 137 06/08/2015 1239   K 4.3 06/08/2015 1239   CL 102 06/08/2015 1239   CO2 27 06/08/2015 1239   BUN 30* 06/08/2015 1239   CREATININE 1.56* 06/08/2015 1239      Component Value Date/Time   CALCIUM 8.6* 06/08/2015 1239   ALKPHOS 63 06/08/2015 1239   AST 18 06/08/2015 1239   ALT 12* 06/08/2015 1239   BILITOT 0.7 06/08/2015 1239        PENDING LABS:   RADIOGRAPHIC STUDIES:  No results found.   PATHOLOGY:        ASSESSMENT AND PLAN:  Squamous Cell Cancer, true vocal cord (left) Stage II, left true vocal cord squamous cell carcinoma.  Undergoing XRT.  No oncology role at this particular time for this diagnosis.  Currently being managed by Dr. Isidore Moos  (Rad Onc).  Return in 6 weeks for follow-up, sooner if necessary.    THERAPY PLAN:  He will start radiation therapy, tomorrow, 07/25/2015.  All questions were answered. The patient knows to call the clinic with any problems, questions or concerns. We can certainly see the patient much sooner if necessary.  Patient and plan discussed with Dr. Ancil Linsey and she is in agreement with the aforementioned.   This note is electronically signed by: Doy Mince 07/24/2015 4:11 PM

## 2015-07-24 NOTE — Assessment & Plan Note (Addendum)
Stage II, left true vocal cord squamous cell carcinoma.  Undergoing XRT.  No oncology role at this particular time for this diagnosis.  Currently being managed by Dr. Isidore Moos (Rad Onc).  Return in 6 weeks for follow-up, sooner if necessary.

## 2015-07-25 DIAGNOSIS — Z510 Encounter for antineoplastic radiation therapy: Secondary | ICD-10-CM | POA: Diagnosis not present

## 2015-07-25 DIAGNOSIS — C320 Malignant neoplasm of glottis: Secondary | ICD-10-CM | POA: Diagnosis not present

## 2015-07-25 DIAGNOSIS — Z85038 Personal history of other malignant neoplasm of large intestine: Secondary | ICD-10-CM | POA: Diagnosis not present

## 2015-07-25 DIAGNOSIS — Z8572 Personal history of non-Hodgkin lymphomas: Secondary | ICD-10-CM | POA: Diagnosis not present

## 2015-07-25 DIAGNOSIS — Z87891 Personal history of nicotine dependence: Secondary | ICD-10-CM | POA: Diagnosis not present

## 2015-07-25 DIAGNOSIS — Z85850 Personal history of malignant neoplasm of thyroid: Secondary | ICD-10-CM | POA: Diagnosis not present

## 2015-07-26 DIAGNOSIS — Z510 Encounter for antineoplastic radiation therapy: Secondary | ICD-10-CM | POA: Diagnosis not present

## 2015-07-26 DIAGNOSIS — Z87891 Personal history of nicotine dependence: Secondary | ICD-10-CM | POA: Diagnosis not present

## 2015-07-26 DIAGNOSIS — Z8572 Personal history of non-Hodgkin lymphomas: Secondary | ICD-10-CM | POA: Diagnosis not present

## 2015-07-26 DIAGNOSIS — Z85038 Personal history of other malignant neoplasm of large intestine: Secondary | ICD-10-CM | POA: Diagnosis not present

## 2015-07-26 DIAGNOSIS — C320 Malignant neoplasm of glottis: Secondary | ICD-10-CM | POA: Diagnosis not present

## 2015-07-26 DIAGNOSIS — Z85850 Personal history of malignant neoplasm of thyroid: Secondary | ICD-10-CM | POA: Diagnosis not present

## 2015-07-27 DIAGNOSIS — C320 Malignant neoplasm of glottis: Secondary | ICD-10-CM | POA: Diagnosis not present

## 2015-07-27 DIAGNOSIS — Z85038 Personal history of other malignant neoplasm of large intestine: Secondary | ICD-10-CM | POA: Diagnosis not present

## 2015-07-27 DIAGNOSIS — Z8572 Personal history of non-Hodgkin lymphomas: Secondary | ICD-10-CM | POA: Diagnosis not present

## 2015-07-27 DIAGNOSIS — Z85850 Personal history of malignant neoplasm of thyroid: Secondary | ICD-10-CM | POA: Diagnosis not present

## 2015-07-27 DIAGNOSIS — Z510 Encounter for antineoplastic radiation therapy: Secondary | ICD-10-CM | POA: Diagnosis not present

## 2015-07-27 DIAGNOSIS — Z87891 Personal history of nicotine dependence: Secondary | ICD-10-CM | POA: Diagnosis not present

## 2015-07-28 DIAGNOSIS — Z510 Encounter for antineoplastic radiation therapy: Secondary | ICD-10-CM | POA: Diagnosis not present

## 2015-07-28 DIAGNOSIS — Z85038 Personal history of other malignant neoplasm of large intestine: Secondary | ICD-10-CM | POA: Diagnosis not present

## 2015-07-28 DIAGNOSIS — Z85850 Personal history of malignant neoplasm of thyroid: Secondary | ICD-10-CM | POA: Diagnosis not present

## 2015-07-28 DIAGNOSIS — C320 Malignant neoplasm of glottis: Secondary | ICD-10-CM | POA: Diagnosis not present

## 2015-07-28 DIAGNOSIS — Z87891 Personal history of nicotine dependence: Secondary | ICD-10-CM | POA: Diagnosis not present

## 2015-07-28 DIAGNOSIS — Z8572 Personal history of non-Hodgkin lymphomas: Secondary | ICD-10-CM | POA: Diagnosis not present

## 2015-07-28 DIAGNOSIS — R1312 Dysphagia, oropharyngeal phase: Secondary | ICD-10-CM | POA: Diagnosis not present

## 2015-07-31 DIAGNOSIS — Z85038 Personal history of other malignant neoplasm of large intestine: Secondary | ICD-10-CM | POA: Diagnosis not present

## 2015-07-31 DIAGNOSIS — Z85850 Personal history of malignant neoplasm of thyroid: Secondary | ICD-10-CM | POA: Diagnosis not present

## 2015-07-31 DIAGNOSIS — Z8572 Personal history of non-Hodgkin lymphomas: Secondary | ICD-10-CM | POA: Diagnosis not present

## 2015-07-31 DIAGNOSIS — C320 Malignant neoplasm of glottis: Secondary | ICD-10-CM | POA: Diagnosis not present

## 2015-07-31 DIAGNOSIS — Z87891 Personal history of nicotine dependence: Secondary | ICD-10-CM | POA: Diagnosis not present

## 2015-07-31 DIAGNOSIS — Z510 Encounter for antineoplastic radiation therapy: Secondary | ICD-10-CM | POA: Diagnosis not present

## 2015-08-01 DIAGNOSIS — Z85038 Personal history of other malignant neoplasm of large intestine: Secondary | ICD-10-CM | POA: Diagnosis not present

## 2015-08-01 DIAGNOSIS — Z87891 Personal history of nicotine dependence: Secondary | ICD-10-CM | POA: Diagnosis not present

## 2015-08-01 DIAGNOSIS — Z8572 Personal history of non-Hodgkin lymphomas: Secondary | ICD-10-CM | POA: Diagnosis not present

## 2015-08-01 DIAGNOSIS — Z510 Encounter for antineoplastic radiation therapy: Secondary | ICD-10-CM | POA: Diagnosis not present

## 2015-08-01 DIAGNOSIS — C320 Malignant neoplasm of glottis: Secondary | ICD-10-CM | POA: Diagnosis not present

## 2015-08-01 DIAGNOSIS — Z85850 Personal history of malignant neoplasm of thyroid: Secondary | ICD-10-CM | POA: Diagnosis not present

## 2015-08-02 DIAGNOSIS — C320 Malignant neoplasm of glottis: Secondary | ICD-10-CM | POA: Diagnosis not present

## 2015-08-02 DIAGNOSIS — Z85038 Personal history of other malignant neoplasm of large intestine: Secondary | ICD-10-CM | POA: Diagnosis not present

## 2015-08-02 DIAGNOSIS — Z8572 Personal history of non-Hodgkin lymphomas: Secondary | ICD-10-CM | POA: Diagnosis not present

## 2015-08-02 DIAGNOSIS — Z85850 Personal history of malignant neoplasm of thyroid: Secondary | ICD-10-CM | POA: Diagnosis not present

## 2015-08-02 DIAGNOSIS — Z510 Encounter for antineoplastic radiation therapy: Secondary | ICD-10-CM | POA: Diagnosis not present

## 2015-08-02 DIAGNOSIS — Z87891 Personal history of nicotine dependence: Secondary | ICD-10-CM | POA: Diagnosis not present

## 2015-08-03 DIAGNOSIS — Z87891 Personal history of nicotine dependence: Secondary | ICD-10-CM | POA: Diagnosis not present

## 2015-08-03 DIAGNOSIS — Z8572 Personal history of non-Hodgkin lymphomas: Secondary | ICD-10-CM | POA: Diagnosis not present

## 2015-08-03 DIAGNOSIS — Z85038 Personal history of other malignant neoplasm of large intestine: Secondary | ICD-10-CM | POA: Diagnosis not present

## 2015-08-03 DIAGNOSIS — Z85850 Personal history of malignant neoplasm of thyroid: Secondary | ICD-10-CM | POA: Diagnosis not present

## 2015-08-03 DIAGNOSIS — C320 Malignant neoplasm of glottis: Secondary | ICD-10-CM | POA: Diagnosis not present

## 2015-08-03 DIAGNOSIS — Z510 Encounter for antineoplastic radiation therapy: Secondary | ICD-10-CM | POA: Diagnosis not present

## 2015-08-04 DIAGNOSIS — C320 Malignant neoplasm of glottis: Secondary | ICD-10-CM | POA: Diagnosis not present

## 2015-08-04 DIAGNOSIS — Z87891 Personal history of nicotine dependence: Secondary | ICD-10-CM | POA: Diagnosis not present

## 2015-08-04 DIAGNOSIS — Z85850 Personal history of malignant neoplasm of thyroid: Secondary | ICD-10-CM | POA: Diagnosis not present

## 2015-08-04 DIAGNOSIS — Z510 Encounter for antineoplastic radiation therapy: Secondary | ICD-10-CM | POA: Diagnosis not present

## 2015-08-04 DIAGNOSIS — Z85038 Personal history of other malignant neoplasm of large intestine: Secondary | ICD-10-CM | POA: Diagnosis not present

## 2015-08-04 DIAGNOSIS — Z8572 Personal history of non-Hodgkin lymphomas: Secondary | ICD-10-CM | POA: Diagnosis not present

## 2015-08-07 DIAGNOSIS — C320 Malignant neoplasm of glottis: Secondary | ICD-10-CM | POA: Diagnosis not present

## 2015-08-07 DIAGNOSIS — Z510 Encounter for antineoplastic radiation therapy: Secondary | ICD-10-CM | POA: Diagnosis not present

## 2015-08-07 DIAGNOSIS — Z8572 Personal history of non-Hodgkin lymphomas: Secondary | ICD-10-CM | POA: Diagnosis not present

## 2015-08-07 DIAGNOSIS — Z87891 Personal history of nicotine dependence: Secondary | ICD-10-CM | POA: Diagnosis not present

## 2015-08-07 DIAGNOSIS — Z85850 Personal history of malignant neoplasm of thyroid: Secondary | ICD-10-CM | POA: Diagnosis not present

## 2015-08-07 DIAGNOSIS — Z85038 Personal history of other malignant neoplasm of large intestine: Secondary | ICD-10-CM | POA: Diagnosis not present

## 2015-08-08 DIAGNOSIS — Z85850 Personal history of malignant neoplasm of thyroid: Secondary | ICD-10-CM | POA: Diagnosis not present

## 2015-08-08 DIAGNOSIS — Z87891 Personal history of nicotine dependence: Secondary | ICD-10-CM | POA: Diagnosis not present

## 2015-08-08 DIAGNOSIS — Z510 Encounter for antineoplastic radiation therapy: Secondary | ICD-10-CM | POA: Diagnosis not present

## 2015-08-08 DIAGNOSIS — Z85038 Personal history of other malignant neoplasm of large intestine: Secondary | ICD-10-CM | POA: Diagnosis not present

## 2015-08-08 DIAGNOSIS — C320 Malignant neoplasm of glottis: Secondary | ICD-10-CM | POA: Diagnosis not present

## 2015-08-08 DIAGNOSIS — Z8572 Personal history of non-Hodgkin lymphomas: Secondary | ICD-10-CM | POA: Diagnosis not present

## 2015-08-09 DIAGNOSIS — C320 Malignant neoplasm of glottis: Secondary | ICD-10-CM | POA: Diagnosis not present

## 2015-08-09 DIAGNOSIS — Z8572 Personal history of non-Hodgkin lymphomas: Secondary | ICD-10-CM | POA: Diagnosis not present

## 2015-08-09 DIAGNOSIS — Z85038 Personal history of other malignant neoplasm of large intestine: Secondary | ICD-10-CM | POA: Diagnosis not present

## 2015-08-09 DIAGNOSIS — Z87891 Personal history of nicotine dependence: Secondary | ICD-10-CM | POA: Diagnosis not present

## 2015-08-09 DIAGNOSIS — Z510 Encounter for antineoplastic radiation therapy: Secondary | ICD-10-CM | POA: Diagnosis not present

## 2015-08-09 DIAGNOSIS — Z85850 Personal history of malignant neoplasm of thyroid: Secondary | ICD-10-CM | POA: Diagnosis not present

## 2015-08-10 DIAGNOSIS — Z510 Encounter for antineoplastic radiation therapy: Secondary | ICD-10-CM | POA: Diagnosis not present

## 2015-08-10 DIAGNOSIS — C320 Malignant neoplasm of glottis: Secondary | ICD-10-CM | POA: Diagnosis not present

## 2015-08-11 DIAGNOSIS — Z510 Encounter for antineoplastic radiation therapy: Secondary | ICD-10-CM | POA: Diagnosis not present

## 2015-08-11 DIAGNOSIS — C320 Malignant neoplasm of glottis: Secondary | ICD-10-CM | POA: Diagnosis not present

## 2015-08-15 DIAGNOSIS — Z510 Encounter for antineoplastic radiation therapy: Secondary | ICD-10-CM | POA: Diagnosis not present

## 2015-08-15 DIAGNOSIS — C320 Malignant neoplasm of glottis: Secondary | ICD-10-CM | POA: Diagnosis not present

## 2015-08-18 DIAGNOSIS — Z510 Encounter for antineoplastic radiation therapy: Secondary | ICD-10-CM | POA: Diagnosis not present

## 2015-08-18 DIAGNOSIS — C320 Malignant neoplasm of glottis: Secondary | ICD-10-CM | POA: Diagnosis not present

## 2015-08-19 ENCOUNTER — Encounter (HOSPITAL_COMMUNITY): Payer: Self-pay | Admitting: Hematology & Oncology

## 2015-08-21 DIAGNOSIS — Z510 Encounter for antineoplastic radiation therapy: Secondary | ICD-10-CM | POA: Diagnosis not present

## 2015-08-21 DIAGNOSIS — C320 Malignant neoplasm of glottis: Secondary | ICD-10-CM | POA: Diagnosis not present

## 2015-08-22 DIAGNOSIS — C320 Malignant neoplasm of glottis: Secondary | ICD-10-CM | POA: Diagnosis not present

## 2015-08-22 DIAGNOSIS — Z510 Encounter for antineoplastic radiation therapy: Secondary | ICD-10-CM | POA: Diagnosis not present

## 2015-08-23 DIAGNOSIS — Z510 Encounter for antineoplastic radiation therapy: Secondary | ICD-10-CM | POA: Diagnosis not present

## 2015-08-23 DIAGNOSIS — C320 Malignant neoplasm of glottis: Secondary | ICD-10-CM | POA: Diagnosis not present

## 2015-08-24 DIAGNOSIS — Z510 Encounter for antineoplastic radiation therapy: Secondary | ICD-10-CM | POA: Diagnosis not present

## 2015-08-24 DIAGNOSIS — C320 Malignant neoplasm of glottis: Secondary | ICD-10-CM | POA: Diagnosis not present

## 2015-08-25 DIAGNOSIS — C320 Malignant neoplasm of glottis: Secondary | ICD-10-CM | POA: Diagnosis not present

## 2015-08-25 DIAGNOSIS — Z510 Encounter for antineoplastic radiation therapy: Secondary | ICD-10-CM | POA: Diagnosis not present

## 2015-09-07 ENCOUNTER — Encounter (HOSPITAL_COMMUNITY): Payer: Self-pay | Admitting: Hematology & Oncology

## 2015-09-07 ENCOUNTER — Encounter (HOSPITAL_COMMUNITY): Payer: Medicare Other | Attending: Oncology | Admitting: Hematology & Oncology

## 2015-09-07 VITALS — BP 154/66 | HR 77 | Temp 97.9°F | Resp 18 | Wt 188.8 lb

## 2015-09-07 DIAGNOSIS — C8590 Non-Hodgkin lymphoma, unspecified, unspecified site: Secondary | ICD-10-CM | POA: Insufficient documentation

## 2015-09-07 DIAGNOSIS — C73 Malignant neoplasm of thyroid gland: Secondary | ICD-10-CM | POA: Diagnosis not present

## 2015-09-07 DIAGNOSIS — C320 Malignant neoplasm of glottis: Secondary | ICD-10-CM | POA: Diagnosis not present

## 2015-09-07 DIAGNOSIS — E890 Postprocedural hypothyroidism: Secondary | ICD-10-CM | POA: Diagnosis not present

## 2015-09-07 DIAGNOSIS — C189 Malignant neoplasm of colon, unspecified: Secondary | ICD-10-CM | POA: Insufficient documentation

## 2015-09-07 DIAGNOSIS — C8330 Diffuse large B-cell lymphoma, unspecified site: Secondary | ICD-10-CM

## 2015-09-07 MED ORDER — LEVOTHYROXINE SODIUM 125 MCG PO TABS: 125.0000 ug | ORAL_TABLET | Freq: Every day | ORAL | Status: DC

## 2015-09-07 NOTE — Progress Notes (Signed)
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 Per available records and after talking to Dr. Benjamine Mola the lesion is early however of significant size and the patient is referred to [place] today for additional discussion.  The patient goes by both [name] and Red.  He is here with his daughter who is also his care giver.  They live close to [place].  He has a history of non-Hodgkin's lymphoma, primary thyroid follicular carcinoma, and colon cancer. He is known to the clinic here.   The patient finished radiation 2 weeks ago.  He loss 6 lbs during the process buy has now gained this weight back.  He reports that his throat hurts only when he has to swallow.  This is improving.  He is forcing himself to eat.  His next appointment with Dr. Isidore Moos is on 09/26/15. He is pleased with how well he did with treatment.    MEDICAL HISTORY:  Past Medical History  Diagnosis Date  . History of colon cancer     Status post resection in 1993  . Essential hypertension, benign   . Degenerative joint disease   . Port catheter in place 08/21/2012  . Hypothyroidism   . Cataracts, bilateral   . Detached retina   . GERD (gastroesophageal reflux disease)   . Lymphoma     Diffuse large B cell status post R CHOP - Dr. Tressie Stalker, Remission since Dec 2007  . Follicular thyroid carcinoma     Dr. Harlow Asa  . History of NHL (non-Hodgkin's lymphoma) 12/06/2013    Presented with Hypercalcemia  and extensive adenopathy in chest and abdomen.  Biopsy proven on 12/04/2006.  S/P R-CHOP x 6 cycles with complete remission  . Primary thyroid follicular carcinoma 80/99/8338    S/P total thyroidectomy by Dr. Armandina Gemma.  On synthroid replacement.  . History of Colon cancer 12/06/2013    S/P resection in 1993  . History of Primary thyroid follicular carcinoma 25/04/3975    S/P total thyroidectomy by Dr. Armandina Gemma.  On synthroid replacement.   . Squamous cell carcinoma of left vocal cord 07/03/15  . Squamous Cell Cancer, true vocal cords 07/24/2015    SURGICAL HISTORY: Past Surgical History  Procedure Laterality Date  . Detached retina left eye  [place]  . Bilateral cataract surgery  1988 Left, [place]  . Colon cancer resection  1993    Dr. Leontine Locket  . Lymphoma surgery  2007    Dr. Jory Ee  . Thyroid cancer surgery  2009    Dr. Theodis Sato  . Transurethral resection of prostate  2011    Dr. Alphonsa Gin  . Left knee replacement  2004    Dr. Amado Coe  . Hernia repair Bilateral 2000-Left, 2005-Right    Dr. Leontine Locket  . Total knee arthroplasty Right 02/08/2013    Procedure: TOTAL KNEE ARTHROPLASTY;  Surgeon: Carole Civil, MD;  Location: [facility];  Service: Orthopedics;  Laterality: Right;  . Joint replacement    .  Total knee arthroplasty Left 02/2013    Dr Anselm Jungling  . Cholecystectomy      1993  . Colon surgery    . Eye surgery    . Port-a-cath removal Left 06/28/2013    Procedure: REMOVAL PORT-A-CATH;  Surgeon: Melrose Nakayama, MD;  Location: [facility];  Service: Thoracic;  Laterality: Left;  Marland Kitchen Microlaryngoscopy Left 07/03/2015    Procedure: MICROLARYNGOSCOPY WITH BIOPSY;  Surgeon: Leta Baptist, MD;  Location: [facility];  Service: ENT;  Laterality: Left;    SOCIAL HISTORY: Social History   Social History  . Marital Status: Divorced    Spouse Name: N/A  . Number of Children: N/A  . Years of Education:  12th grade   Occupational History  . Retired    Social History Main Topics  . Smoking status: Former Smoker -- 1.00 packs/day for 15 years    Types: Cigarettes  . Smokeless tobacco: Never Used     Comment: quit smoking 33 years ago  . Alcohol Use: No  . Drug Use: No  . Sexual Activity: No   Other Topics Concern  . Not on file   Social History Narrative  Divorced and widowed. 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 Arthritis    . Lung disease    . Leukemia Mother   . Lung cancer Father   . Cancer Sister   . Cancer Brother    indicated that his mother is deceased. He indicated that his father is deceased. He indicated that his sister is deceased. He indicated that his brother is deceased.  Father deceased, 60, lung cancer (heavy cancer) Mother deceased, 35, leukemia 1 brother, 1 sister, both deceased  ALLERGIES:  is allergic to tramadol.  MEDICATIONS:  Current Outpatient Prescriptions  Medication Sig Dispense Refill  . allopurinol (ZYLOPRIM) 300 MG tablet Take 300 mg by mouth daily.      Marland Kitchen aspirin 81 MG tablet Take 81 mg by mouth every other day.     . bisoprolol-hydrochlorothiazide (ZIAC) 2.5-6.25 MG per tablet Take 1 tablet by mouth every other day.     . levothyroxine (SYNTHROID, LEVOTHROID) 125 MCG tablet Take 1 tablet (125 mcg total) by mouth daily. 30 tablet 6  . lisinopril (PRINIVIL,ZESTRIL) 10 MG tablet Take 10 mg by mouth daily.     . Multiple Vitamin (MULTIVITAMIN) capsule Take 1 capsule by mouth daily.      Marland Kitchen omeprazole (PRILOSEC) 20 MG capsule Take 20 mg by mouth daily.     Marland Kitchen terazosin (HYTRIN) 10 MG capsule Take 10 mg by mouth at bedtime.      No current facility-administered medications for this visit.   Facility-Administered Medications  Ordered in Other Visits  Medication Dose Route Frequency Provider Last Rate Last Dose  . sodium chloride 0.9 % injection 10 mL  10 mL Intravenous PRN Baird Cancer, PA-C   10 mL at 06/14/13 1445    Review of Systems    All other systems reviewed and are negative.  14 point ROS was done and is otherwise as detailed above or in HPI  PHYSICAL EXAMINATION: ECOG PERFORMANCE STATUS: 1 - Symptomatic but completely ambulatory  Filed Vitals:   09/07/15 1349  BP: 154/66  Pulse: 77  Temp: 97.9 F (36.6 C)  Resp: 18   Filed Weights   09/07/15 1349  Weight: 188 lb 12.8 oz (85.639 kg)   Physical Exam  Constitutional: He is oriented to person, place, and time and well-developed, well-nourished, and in no distress.  HENT:  Head: Normocephalic and atraumatic.  Right Ear: External ear normal.  Left Ear: External ear normal.  Nose: Nose normal.  Mouth/Throat: Oropharynx is clear and moist. No oropharyngeal exudate.  Eyes: Conjunctivae and EOM are normal. Pupils are equal, round, and reactive to light. Right eye exhibits no discharge. Left eye exhibits no discharge. No scleral icterus.  Neck: Normal range of motion. Neck supple. No tracheal deviation present. No thyromegaly present. Mild XRT changes. Cardiovascular: Normal rate, regular rhythm, normal heart sounds and intact distal pulses.  Exam reveals no gallop and no friction rub.   No murmur heard. Pulmonary/Chest: Effort normal and breath sounds normal. He has no wheezes. He has no rales.  Abdominal: Soft. Bowel sounds are normal. He exhibits no distension and no mass. There is no tenderness. There is no rebound and no guarding.  Musculoskeletal: Normal range of motion. He exhibits no edema.  Lymphadenopathy:    He has no cervical adenopathy.  Neurological: He is alert and oriented to person, place, and time. He has normal reflexes. No cranial nerve deficit. Gait normal. Coordination normal.  Skin: Skin is warm and dry. No rash noted.    Psychiatric: Mood, memory, affect and judgment normal.  Nursing note and vitals reviewed.   LABORATORY DATA:  I have reviewed the data as listed Lab Results  Component Value Date   WBC 6.6 06/08/2015   HGB 11.7* 06/08/2015   HCT 36.2* 06/08/2015   MCV 91.2 06/08/2015   PLT 149* 06/08/2015     RADIOGRAPHIC STUDIES: I have personally reviewed the radiological images as listed and agreed with the findings in the report.  CLINICAL DATA: Four days of productive cough, less productive today, history of colonic malignancy, hypertension, and previous tobacco use.  EXAM: CHEST 2 VIEW  COMPARISON: Chest x-ray of July 01, 2014 and [place] and lateral chest x-ray of July 13, 2013 peer  FINDINGS: The lungs are hyperinflated with hemidiaphragm flattening. There are coarse lung markings in the mid and lower lung zones which are stable. There is no alveolar infiltrate. There is chronic blunting of the right lateral and posterior costophrenic angles. The heart is top-normal in size. The pulmonary vascularity is normal. The trachea is midline. The bony thorax exhibits no acute abnormality.  IMPRESSION: COPD. Chronic basilar scarring greater on the right than on the left. There is no pneumonia nor CHF.   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 He did suprisingly well. He has no major complaints or concerns.  We will follow-up with him the first week of December. We will continue to monitor his thyroid, note that he has a history of hypothyroidism already. I have refilled his levothyroxine today to [facility]. He was advised that he could start back on his 81mg  aspirin daily.  All questions were  answered. The patient knows to call the clinic with any problems, questions or concerns.    This document serves as a record of services personally performed by Ancil Linsey, MD. It was  created on her behalf by Janace Hoard, a trained medical scribe. The creation of this record is based on the scribe's personal observations and the provider's statements to them. This document has been checked and approved by the attending provider.  I have reviewed the above documentation for accuracy and completeness, and I agree with the above.  This note was electronically signed.  Kelby Fam. Whitney Muse, MD

## 2015-09-07 NOTE — Patient Instructions (Signed)
[place] Discharge Instructions  RECOMMENDATIONS MADE BY THE CONSULTANT AND ANY TEST RESULTS WILL BE SENT TO YOUR REFERRING PHYSICIAN.  1.  Please return in December as scheduled. 2.  Your thyroid medication has been sent to your pharmacy; please pick it up at your convenience.    Thank you for choosing [place] at [facility] to provide your oncology and hematology care.  To afford each patient quality time with our provider, please arrive at least 15 minutes before your scheduled appointment time.    You need to re-schedule your appointment should you arrive 10 or more minutes late.  We strive to give you quality time with our providers, and arriving late affects you and other patients whose appointments are after yours.  Also, if you no show three or more times for appointments you may be dismissed from the clinic at the providers discretion.     Again, thank you for choosing [facility].  Our hope is that these requests will decrease the amount of time that you wait before being seen by our physicians.       _____________________________________________________________  Should you have questions after your visit to [facility], please contact our office at [phone] between the hours of 8:30 a.m. and 4:30 p.m.  Voicemails left after 4:30 p.m. will not be returned until the following business day.  For prescription refill requests, have your pharmacy contact our office.

## 2015-09-26 DIAGNOSIS — C320 Malignant neoplasm of glottis: Secondary | ICD-10-CM | POA: Diagnosis not present

## 2015-09-26 DIAGNOSIS — Z923 Personal history of irradiation: Secondary | ICD-10-CM | POA: Diagnosis not present

## 2015-09-27 DIAGNOSIS — Z23 Encounter for immunization: Secondary | ICD-10-CM | POA: Diagnosis not present

## 2015-10-23 DIAGNOSIS — X32XXXD Exposure to sunlight, subsequent encounter: Secondary | ICD-10-CM | POA: Diagnosis not present

## 2015-10-23 DIAGNOSIS — L570 Actinic keratosis: Secondary | ICD-10-CM | POA: Diagnosis not present

## 2015-10-23 DIAGNOSIS — C44320 Squamous cell carcinoma of skin of unspecified parts of face: Secondary | ICD-10-CM | POA: Diagnosis not present

## 2015-10-23 DIAGNOSIS — Z85828 Personal history of other malignant neoplasm of skin: Secondary | ICD-10-CM | POA: Diagnosis not present

## 2015-10-23 DIAGNOSIS — Z08 Encounter for follow-up examination after completed treatment for malignant neoplasm: Secondary | ICD-10-CM | POA: Diagnosis not present

## 2015-10-23 DIAGNOSIS — C44329 Squamous cell carcinoma of skin of other parts of face: Secondary | ICD-10-CM | POA: Diagnosis not present

## 2015-11-08 DIAGNOSIS — R490 Dysphonia: Secondary | ICD-10-CM | POA: Diagnosis not present

## 2015-11-08 DIAGNOSIS — T161XXA Foreign body in right ear, initial encounter: Secondary | ICD-10-CM | POA: Diagnosis not present

## 2015-11-15 ENCOUNTER — Ambulatory Visit (HOSPITAL_COMMUNITY): Payer: Medicare Other | Admitting: Hematology & Oncology

## 2015-12-06 ENCOUNTER — Encounter (HOSPITAL_COMMUNITY): Payer: Self-pay | Admitting: Hematology & Oncology

## 2015-12-06 ENCOUNTER — Encounter (HOSPITAL_COMMUNITY): Payer: Medicare Other

## 2015-12-06 ENCOUNTER — Encounter (HOSPITAL_COMMUNITY): Payer: Medicare Other | Attending: Hematology & Oncology | Admitting: Hematology & Oncology

## 2015-12-06 VITALS — BP 158/80 | HR 73 | Temp 98.5°F | Resp 16 | Wt 200.0 lb

## 2015-12-06 DIAGNOSIS — C320 Malignant neoplasm of glottis: Secondary | ICD-10-CM | POA: Insufficient documentation

## 2015-12-06 DIAGNOSIS — C73 Malignant neoplasm of thyroid gland: Secondary | ICD-10-CM | POA: Diagnosis not present

## 2015-12-06 LAB — COMPREHENSIVE METABOLIC PANEL
ALK PHOS: 64 U/L (ref 38–126)
ALT: 16 U/L — AB (ref 17–63)
AST: 15 U/L (ref 15–41)
Albumin: 4 g/dL (ref 3.5–5.0)
Anion gap: 6 (ref 5–15)
BILIRUBIN TOTAL: 0.7 mg/dL (ref 0.3–1.2)
BUN: 23 mg/dL — ABNORMAL HIGH (ref 6–20)
CALCIUM: 8.9 mg/dL (ref 8.9–10.3)
CO2: 28 mmol/L (ref 22–32)
CREATININE: 1.34 mg/dL — AB (ref 0.61–1.24)
Chloride: 105 mmol/L (ref 101–111)
GFR calc Af Amer: 52 mL/min — ABNORMAL LOW (ref >60)
GFR calc non Af Amer: 45 mL/min — ABNORMAL LOW (ref >60)
GLUCOSE: 113 mg/dL — AB (ref 65–99)
Potassium: 4.3 mmol/L (ref 3.5–5.1)
Sodium: 139 mmol/L (ref 135–145)
TOTAL PROTEIN: 6.2 g/dL — AB (ref 6.5–8.1)

## 2015-12-06 LAB — CBC WITH DIFFERENTIAL/PLATELET
BASOS ABS: 0 10*3/uL (ref 0.0–0.1)
Basophils Relative: 1 %
Eosinophils Absolute: 0.2 10*3/uL (ref 0.0–0.7)
Eosinophils Relative: 4 %
HEMATOCRIT: 33.7 % — AB (ref 39.0–52.0)
HEMOGLOBIN: 11 g/dL — AB (ref 13.0–17.0)
LYMPHS PCT: 11 %
Lymphs Abs: 0.6 10*3/uL — ABNORMAL LOW (ref 0.7–4.0)
MCH: 30.2 pg (ref 26.0–34.0)
MCHC: 32.6 g/dL (ref 30.0–36.0)
MCV: 92.6 fL (ref 78.0–100.0)
Monocytes Absolute: 0.3 10*3/uL (ref 0.1–1.0)
Monocytes Relative: 6 %
NEUTROS ABS: 4.7 10*3/uL (ref 1.7–7.7)
NEUTROS PCT: 78 %
Platelets: 158 10*3/uL (ref 150–400)
RBC: 3.64 MIL/uL — ABNORMAL LOW (ref 4.22–5.81)
RDW: 14.6 % (ref 11.5–15.5)
WBC: 5.9 10*3/uL (ref 4.0–10.5)

## 2015-12-06 LAB — TSH: TSH: 5.827 u[IU]/mL — ABNORMAL HIGH (ref 0.350–4.500)
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
## 2015-12-06 NOTE — Patient Instructions (Addendum)
[place] at [facility] Discharge Instructions  RECOMMENDATIONS MADE BY THE CONSULTANT AND ANY TEST RESULTS WILL BE SENT TO YOUR REFERRING PHYSICIAN.     Exam completed by Dr Whitney Muse today Lab work today Return to see the doctor in 4 months  Please call the clinic if you have any questions or concerns     Thank you for choosing [place] at [facility] to provide your oncology and hematology care.  To afford each patient quality time with our provider, please arrive at least 15 minutes before your scheduled appointment time.    You need to re-schedule your appointment should you arrive 10 or more minutes late.  We strive to give you quality time with our providers, and arriving late affects you and other patients whose appointments are after yours.  Also, if you no show three or more times for appointments you may be dismissed from the clinic at the providers discretion.     Again, thank you for choosing [facility].  Our hope is that these requests will decrease the amount of time that you wait before being seen by our physicians.       _____________________________________________________________  Should you have questions after your visit to [facility], please contact our office at [phone] between the hours of 8:30 a.m. and 4:30 p.m.  Voicemails left after 4:30 p.m. will not be returned until the following business day.  For prescription refill requests, have your pharmacy contact our office.

## 2016-01-02 ENCOUNTER — Other Ambulatory Visit (HOSPITAL_COMMUNITY): Payer: Self-pay | Admitting: Emergency Medicine

## 2016-01-24 ENCOUNTER — Encounter: Payer: Self-pay | Admitting: *Deleted

## 2016-02-15 ENCOUNTER — Ambulatory Visit: Payer: Medicare Other | Admitting: Orthopedic Surgery

## 2016-02-19 ENCOUNTER — Encounter: Payer: Self-pay | Admitting: Orthopedic Surgery

## 2016-02-19 ENCOUNTER — Encounter: Payer: Medicare Other | Admitting: Orthopedic Surgery

## 2016-02-26 DIAGNOSIS — Z1389 Encounter for screening for other disorder: Secondary | ICD-10-CM | POA: Diagnosis not present

## 2016-02-26 DIAGNOSIS — Z0000 Encounter for general adult medical examination without abnormal findings: Secondary | ICD-10-CM | POA: Diagnosis not present

## 2016-02-26 DIAGNOSIS — E782 Mixed hyperlipidemia: Secondary | ICD-10-CM | POA: Diagnosis not present

## 2016-02-26 DIAGNOSIS — Z6828 Body mass index (BMI) 28.0-28.9, adult: Secondary | ICD-10-CM | POA: Diagnosis not present

## 2016-02-26 DIAGNOSIS — E663 Overweight: Secondary | ICD-10-CM | POA: Diagnosis not present

## 2016-02-26 DIAGNOSIS — E039 Hypothyroidism, unspecified: Secondary | ICD-10-CM | POA: Diagnosis not present

## 2016-02-26 DIAGNOSIS — Z125 Encounter for screening for malignant neoplasm of prostate: Secondary | ICD-10-CM | POA: Diagnosis not present

## 2016-02-27 DIAGNOSIS — D649 Anemia, unspecified: Secondary | ICD-10-CM | POA: Diagnosis not present

## 2016-02-27 DIAGNOSIS — R5383 Other fatigue: Secondary | ICD-10-CM | POA: Diagnosis not present

## 2016-04-02 ENCOUNTER — Ambulatory Visit: Payer: Medicare Other

## 2016-04-03 NOTE — Progress Notes (Signed)
No show

## 2016-04-03 NOTE — Assessment & Plan Note (Signed)
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 AND 1. Diffuse large B-cell lymphoma presented with hypercalcemia and extensive adenopathy within the chest and abdomen. He presented with biopsy-proven disease on 12/04/2006 and treated with R-CHOP chemotherapy x 6 cycles with complete remission.  2. 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 Armandina Gemma. On Synthroid replacement  3. History of colon cancer in 1993 status post resection  Oncology history is up to date.  Labs today: CBC diff, CMET, TSH  Labs in 2 months: CBC diff, CMET, TSH  Continue levothyroxine as described.  Will adjust dose as indicated.  NCCN guidelines recommends a baseline CT imaging within 6 months of completion of treatment, but his malignancy was not identified on imaging and was incidentally found.  I do not think pursuing imaging at this time would be of benefit given the patient's age and co-morbidities.  Return in 2 months for follow-up.

## 2016-04-05 ENCOUNTER — Other Ambulatory Visit (HOSPITAL_COMMUNITY): Payer: Medicare Other

## 2016-04-05 ENCOUNTER — Ambulatory Visit (HOSPITAL_COMMUNITY): Payer: Medicare Other | Admitting: Oncology

## 2016-04-16 ENCOUNTER — Other Ambulatory Visit (HOSPITAL_COMMUNITY): Payer: Self-pay | Admitting: Hematology & Oncology

## 2016-04-22 DIAGNOSIS — N400 Enlarged prostate without lower urinary tract symptoms: Secondary | ICD-10-CM | POA: Diagnosis not present

## 2016-04-22 DIAGNOSIS — E039 Hypothyroidism, unspecified: Secondary | ICD-10-CM | POA: Diagnosis not present

## 2016-04-22 DIAGNOSIS — M1991 Primary osteoarthritis, unspecified site: Secondary | ICD-10-CM | POA: Diagnosis not present

## 2016-04-22 DIAGNOSIS — R630 Anorexia: Secondary | ICD-10-CM | POA: Diagnosis not present

## 2016-04-22 DIAGNOSIS — R634 Abnormal weight loss: Secondary | ICD-10-CM | POA: Diagnosis not present

## 2016-04-22 DIAGNOSIS — Z6827 Body mass index (BMI) 27.0-27.9, adult: Secondary | ICD-10-CM | POA: Diagnosis not present

## 2016-04-22 DIAGNOSIS — Z1389 Encounter for screening for other disorder: Secondary | ICD-10-CM | POA: Diagnosis not present

## 2016-04-22 DIAGNOSIS — E538 Deficiency of other specified B group vitamins: Secondary | ICD-10-CM | POA: Diagnosis not present

## 2016-04-24 DIAGNOSIS — M1991 Primary osteoarthritis, unspecified site: Secondary | ICD-10-CM | POA: Diagnosis not present

## 2016-04-24 DIAGNOSIS — D649 Anemia, unspecified: Secondary | ICD-10-CM | POA: Diagnosis not present

## 2016-05-15 DIAGNOSIS — S93331A Other subluxation of right foot, initial encounter: Secondary | ICD-10-CM | POA: Diagnosis not present

## 2016-05-15 DIAGNOSIS — M79671 Pain in right foot: Secondary | ICD-10-CM | POA: Diagnosis not present

## 2016-05-15 DIAGNOSIS — B351 Tinea unguium: Secondary | ICD-10-CM | POA: Diagnosis not present

## 2016-05-27 ENCOUNTER — Ambulatory Visit (INDEPENDENT_AMBULATORY_CARE_PROVIDER_SITE_OTHER): Payer: Medicare Other | Admitting: Otolaryngology

## 2016-05-27 DIAGNOSIS — T162XXA Foreign body in left ear, initial encounter: Secondary | ICD-10-CM | POA: Diagnosis not present

## 2016-06-07 ENCOUNTER — Other Ambulatory Visit (HOSPITAL_COMMUNITY): Payer: Medicare Other

## 2016-06-07 ENCOUNTER — Ambulatory Visit (HOSPITAL_COMMUNITY): Payer: Medicare Other | Admitting: Hematology & Oncology

## 2016-06-13 ENCOUNTER — Other Ambulatory Visit (HOSPITAL_COMMUNITY): Payer: Self-pay | Admitting: Hematology & Oncology

## 2016-07-08 ENCOUNTER — Other Ambulatory Visit (HOSPITAL_COMMUNITY): Payer: Self-pay | Admitting: Hematology & Oncology

## 2016-08-07 ENCOUNTER — Other Ambulatory Visit (HOSPITAL_COMMUNITY): Payer: Self-pay | Admitting: Hematology & Oncology

## 2016-08-07 DIAGNOSIS — I739 Peripheral vascular disease, unspecified: Secondary | ICD-10-CM | POA: Diagnosis not present

## 2016-08-07 DIAGNOSIS — L110 Acquired keratosis follicularis: Secondary | ICD-10-CM | POA: Diagnosis not present

## 2016-08-07 DIAGNOSIS — B351 Tinea unguium: Secondary | ICD-10-CM | POA: Diagnosis not present

## 2016-08-28 DIAGNOSIS — E782 Mixed hyperlipidemia: Secondary | ICD-10-CM | POA: Diagnosis not present

## 2016-08-28 DIAGNOSIS — Z6828 Body mass index (BMI) 28.0-28.9, adult: Secondary | ICD-10-CM | POA: Diagnosis not present

## 2016-08-28 DIAGNOSIS — Z1389 Encounter for screening for other disorder: Secondary | ICD-10-CM | POA: Diagnosis not present

## 2016-08-28 DIAGNOSIS — E663 Overweight: Secondary | ICD-10-CM | POA: Diagnosis not present

## 2016-08-28 DIAGNOSIS — I4891 Unspecified atrial fibrillation: Secondary | ICD-10-CM | POA: Diagnosis not present

## 2016-08-28 DIAGNOSIS — R7309 Other abnormal glucose: Secondary | ICD-10-CM | POA: Diagnosis not present

## 2016-08-28 DIAGNOSIS — I10 Essential (primary) hypertension: Secondary | ICD-10-CM | POA: Diagnosis not present

## 2016-08-29 ENCOUNTER — Ambulatory Visit (INDEPENDENT_AMBULATORY_CARE_PROVIDER_SITE_OTHER): Payer: Medicare Other | Admitting: Otolaryngology

## 2016-08-29 DIAGNOSIS — T161XXA Foreign body in right ear, initial encounter: Secondary | ICD-10-CM

## 2016-09-03 ENCOUNTER — Encounter: Payer: Self-pay | Admitting: Cardiology

## 2016-10-23 DIAGNOSIS — B351 Tinea unguium: Secondary | ICD-10-CM | POA: Diagnosis not present

## 2016-10-23 DIAGNOSIS — L110 Acquired keratosis follicularis: Secondary | ICD-10-CM | POA: Diagnosis not present

## 2016-10-23 DIAGNOSIS — I739 Peripheral vascular disease, unspecified: Secondary | ICD-10-CM | POA: Diagnosis not present

## 2017-01-08 DIAGNOSIS — L110 Acquired keratosis follicularis: Secondary | ICD-10-CM | POA: Diagnosis not present

## 2017-01-08 DIAGNOSIS — B351 Tinea unguium: Secondary | ICD-10-CM | POA: Diagnosis not present

## 2017-01-08 DIAGNOSIS — I739 Peripheral vascular disease, unspecified: Secondary | ICD-10-CM | POA: Diagnosis not present

## 2017-02-25 ENCOUNTER — Other Ambulatory Visit (HOSPITAL_COMMUNITY): Payer: Self-pay | Admitting: Family Medicine

## 2017-02-25 ENCOUNTER — Ambulatory Visit (HOSPITAL_COMMUNITY)
Admission: RE | Admit: 2017-02-25 | Discharge: 2017-02-25 | Disposition: A | Discharge status: Home / Self Care | Payer: Medicare Other | Source: Ambulatory Visit | Attending: Family Medicine | Admitting: Family Medicine

## 2017-02-25 DIAGNOSIS — Z1389 Encounter for screening for other disorder: Secondary | ICD-10-CM | POA: Diagnosis not present

## 2017-02-25 DIAGNOSIS — K4090 Unilateral inguinal hernia, without obstruction or gangrene, not specified as recurrent: Secondary | ICD-10-CM | POA: Diagnosis not present

## 2017-02-25 DIAGNOSIS — R600 Localized edema: Secondary | ICD-10-CM

## 2017-02-25 DIAGNOSIS — I739 Peripheral vascular disease, unspecified: Secondary | ICD-10-CM | POA: Diagnosis not present

## 2017-02-25 DIAGNOSIS — Z6828 Body mass index (BMI) 28.0-28.9, adult: Secondary | ICD-10-CM | POA: Diagnosis not present

## 2017-02-25 DIAGNOSIS — K4091 Unilateral inguinal hernia, without obstruction or gangrene, recurrent: Secondary | ICD-10-CM

## 2017-03-19 DIAGNOSIS — B351 Tinea unguium: Secondary | ICD-10-CM | POA: Diagnosis not present

## 2017-03-19 DIAGNOSIS — I739 Peripheral vascular disease, unspecified: Secondary | ICD-10-CM | POA: Diagnosis not present

## 2017-03-19 DIAGNOSIS — L110 Acquired keratosis follicularis: Secondary | ICD-10-CM | POA: Diagnosis not present

## 2017-03-24 DIAGNOSIS — E663 Overweight: Secondary | ICD-10-CM | POA: Diagnosis not present

## 2017-03-24 DIAGNOSIS — Z6827 Body mass index (BMI) 27.0-27.9, adult: Secondary | ICD-10-CM | POA: Diagnosis not present

## 2017-03-24 DIAGNOSIS — Z0000 Encounter for general adult medical examination without abnormal findings: Secondary | ICD-10-CM | POA: Diagnosis not present

## 2017-03-24 DIAGNOSIS — Z1389 Encounter for screening for other disorder: Secondary | ICD-10-CM | POA: Diagnosis not present

## 2017-04-27 NOTE — Progress Notes (Signed)
This encounter was created in error - please disregard.

## 2017-06-04 DIAGNOSIS — I739 Peripheral vascular disease, unspecified: Secondary | ICD-10-CM | POA: Diagnosis not present

## 2017-06-04 DIAGNOSIS — L110 Acquired keratosis follicularis: Secondary | ICD-10-CM | POA: Diagnosis not present

## 2017-06-04 DIAGNOSIS — B351 Tinea unguium: Secondary | ICD-10-CM | POA: Diagnosis not present

## 2017-07-29 DIAGNOSIS — M1991 Primary osteoarthritis, unspecified site: Secondary | ICD-10-CM | POA: Diagnosis not present

## 2017-07-29 DIAGNOSIS — Z6826 Body mass index (BMI) 26.0-26.9, adult: Secondary | ICD-10-CM | POA: Diagnosis not present

## 2017-07-29 DIAGNOSIS — M545 Low back pain: Secondary | ICD-10-CM | POA: Diagnosis not present

## 2017-07-29 DIAGNOSIS — M5136 Other intervertebral disc degeneration, lumbar region: Secondary | ICD-10-CM | POA: Diagnosis not present

## 2017-07-29 DIAGNOSIS — M5410 Radiculopathy, site unspecified: Secondary | ICD-10-CM | POA: Diagnosis not present

## 2017-08-06 DIAGNOSIS — L110 Acquired keratosis follicularis: Secondary | ICD-10-CM | POA: Diagnosis not present

## 2017-08-06 DIAGNOSIS — L89892 Pressure ulcer of other site, stage 2: Secondary | ICD-10-CM | POA: Diagnosis not present

## 2017-08-06 DIAGNOSIS — I739 Peripheral vascular disease, unspecified: Secondary | ICD-10-CM | POA: Diagnosis not present

## 2017-08-06 DIAGNOSIS — B351 Tinea unguium: Secondary | ICD-10-CM | POA: Diagnosis not present

## 2017-09-08 DIAGNOSIS — Z23 Encounter for immunization: Secondary | ICD-10-CM | POA: Diagnosis not present

## 2017-09-10 DIAGNOSIS — I739 Peripheral vascular disease, unspecified: Secondary | ICD-10-CM | POA: Diagnosis not present

## 2017-09-10 DIAGNOSIS — M79672 Pain in left foot: Secondary | ICD-10-CM | POA: Diagnosis not present

## 2017-09-10 DIAGNOSIS — L89892 Pressure ulcer of other site, stage 2: Secondary | ICD-10-CM | POA: Diagnosis not present

## 2017-10-22 DIAGNOSIS — M79671 Pain in right foot: Secondary | ICD-10-CM | POA: Diagnosis not present

## 2017-10-22 DIAGNOSIS — L608 Other nail disorders: Secondary | ICD-10-CM | POA: Diagnosis not present

## 2017-10-22 DIAGNOSIS — L110 Acquired keratosis follicularis: Secondary | ICD-10-CM | POA: Diagnosis not present

## 2017-10-22 DIAGNOSIS — I739 Peripheral vascular disease, unspecified: Secondary | ICD-10-CM | POA: Diagnosis not present

## 2017-12-22 ENCOUNTER — Encounter (HOSPITAL_COMMUNITY): Payer: Self-pay | Admitting: *Deleted

## 2017-12-22 ENCOUNTER — Emergency Department (HOSPITAL_COMMUNITY): Payer: Medicare Other

## 2017-12-22 ENCOUNTER — Other Ambulatory Visit: Payer: Self-pay

## 2017-12-22 ENCOUNTER — Emergency Department (HOSPITAL_COMMUNITY)
Admission: EM | Admit: 2017-12-22 | Discharge: 2017-12-23 | Disposition: A | Discharge status: Home / Self Care | Payer: Medicare Other | Attending: Emergency Medicine | Admitting: Emergency Medicine

## 2017-12-22 DIAGNOSIS — S0093XA Contusion of unspecified part of head, initial encounter: Secondary | ICD-10-CM

## 2017-12-22 DIAGNOSIS — Z96652 Presence of left artificial knee joint: Secondary | ICD-10-CM | POA: Diagnosis not present

## 2017-12-22 DIAGNOSIS — E039 Hypothyroidism, unspecified: Secondary | ICD-10-CM | POA: Diagnosis not present

## 2017-12-22 DIAGNOSIS — W19XXXA Unspecified fall, initial encounter: Secondary | ICD-10-CM | POA: Diagnosis not present

## 2017-12-22 DIAGNOSIS — Z7982 Long term (current) use of aspirin: Secondary | ICD-10-CM | POA: Insufficient documentation

## 2017-12-22 DIAGNOSIS — Z85828 Personal history of other malignant neoplasm of skin: Secondary | ICD-10-CM | POA: Insufficient documentation

## 2017-12-22 DIAGNOSIS — Y92009 Unspecified place in unspecified non-institutional (private) residence as the place of occurrence of the external cause: Secondary | ICD-10-CM

## 2017-12-22 DIAGNOSIS — M25561 Pain in right knee: Secondary | ICD-10-CM | POA: Diagnosis not present

## 2017-12-22 DIAGNOSIS — R2689 Other abnormalities of gait and mobility: Secondary | ICD-10-CM | POA: Diagnosis not present

## 2017-12-22 DIAGNOSIS — Z85038 Personal history of other malignant neoplasm of large intestine: Secondary | ICD-10-CM | POA: Insufficient documentation

## 2017-12-22 DIAGNOSIS — Z87891 Personal history of nicotine dependence: Secondary | ICD-10-CM | POA: Insufficient documentation

## 2017-12-22 DIAGNOSIS — Y999 Unspecified external cause status: Secondary | ICD-10-CM | POA: Insufficient documentation

## 2017-12-22 DIAGNOSIS — Y92002 Bathroom of unspecified non-institutional (private) residence single-family (private) house as the place of occurrence of the external cause: Secondary | ICD-10-CM | POA: Diagnosis not present

## 2017-12-22 DIAGNOSIS — S0083XA Contusion of other part of head, initial encounter: Secondary | ICD-10-CM | POA: Diagnosis not present

## 2017-12-22 DIAGNOSIS — Z79899 Other long term (current) drug therapy: Secondary | ICD-10-CM | POA: Diagnosis not present

## 2017-12-22 DIAGNOSIS — Y9389 Activity, other specified: Secondary | ICD-10-CM | POA: Diagnosis not present

## 2017-12-22 DIAGNOSIS — R42 Dizziness and giddiness: Secondary | ICD-10-CM | POA: Diagnosis present

## 2017-12-22 DIAGNOSIS — Z8572 Personal history of non-Hodgkin lymphomas: Secondary | ICD-10-CM | POA: Insufficient documentation

## 2017-12-22 DIAGNOSIS — R404 Transient alteration of awareness: Secondary | ICD-10-CM | POA: Diagnosis not present

## 2017-12-22 DIAGNOSIS — R531 Weakness: Secondary | ICD-10-CM | POA: Diagnosis not present
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
## 2017-12-23 DIAGNOSIS — M25561 Pain in right knee: Secondary | ICD-10-CM | POA: Diagnosis not present

## 2017-12-23 DIAGNOSIS — R42 Dizziness and giddiness: Secondary | ICD-10-CM | POA: Diagnosis not present

## 2017-12-23 LAB — CBC WITH DIFFERENTIAL/PLATELET
Basophils Absolute: 0 10*3/uL (ref 0.0–0.1)
Basophils Relative: 0 %
Eosinophils Absolute: 0.1 10*3/uL (ref 0.0–0.7)
Eosinophils Relative: 1 %
HEMATOCRIT: 32.8 % — AB (ref 39.0–52.0)
Hemoglobin: 10.5 g/dL — ABNORMAL LOW (ref 13.0–17.0)
Lymphocytes Relative: 5 %
Lymphs Abs: 0.5 10*3/uL — ABNORMAL LOW (ref 0.7–4.0)
MCH: 29.3 pg (ref 26.0–34.0)
MCHC: 32 g/dL (ref 30.0–36.0)
MCV: 91.6 fL (ref 78.0–100.0)
MONO ABS: 0.8 10*3/uL (ref 0.1–1.0)
MONOS PCT: 8 %
Neutro Abs: 8.1 10*3/uL — ABNORMAL HIGH (ref 1.7–7.7)
Neutrophils Relative %: 86 %
Platelets: 146 10*3/uL — ABNORMAL LOW (ref 150–400)
RBC: 3.58 MIL/uL — ABNORMAL LOW (ref 4.22–5.81)
RDW: 15 % (ref 11.5–15.5)
WBC: 9.5 10*3/uL (ref 4.0–10.5)

## 2017-12-23 LAB — URINALYSIS, ROUTINE W REFLEX MICROSCOPIC
BACTERIA UA: NONE SEEN
Bilirubin Urine: NEGATIVE
Glucose, UA: NEGATIVE mg/dL
Hgb urine dipstick: NEGATIVE
Ketones, ur: NEGATIVE mg/dL
Leukocytes, UA: NEGATIVE
Nitrite: NEGATIVE
PROTEIN: 30 mg/dL — AB
Specific Gravity, Urine: 1.017 (ref 1.005–1.030)
pH: 5 (ref 5.0–8.0)

## 2017-12-23 LAB — COMPREHENSIVE METABOLIC PANEL
ALBUMIN: 3.9 g/dL (ref 3.5–5.0)
ALT: 15 U/L — AB (ref 17–63)
ANION GAP: 10 (ref 5–15)
AST: 16 U/L (ref 15–41)
Alkaline Phosphatase: 88 U/L (ref 38–126)
BUN: 34 mg/dL — ABNORMAL HIGH (ref 6–20)
CALCIUM: 9.1 mg/dL (ref 8.9–10.3)
CHLORIDE: 106 mmol/L (ref 101–111)
CO2: 25 mmol/L (ref 22–32)
Creatinine, Ser: 1.63 mg/dL — ABNORMAL HIGH (ref 0.61–1.24)
GFR calc Af Amer: 41 mL/min — ABNORMAL LOW (ref >60)
GFR calc non Af Amer: 35 mL/min — ABNORMAL LOW (ref >60)
GLUCOSE: 137 mg/dL — AB (ref 65–99)
Potassium: 4.5 mmol/L (ref 3.5–5.1)
Sodium: 141 mmol/L (ref 135–145)
Total Bilirubin: 1.1 mg/dL (ref 0.3–1.2)
Total Protein: 6.4 g/dL — ABNORMAL LOW (ref 6.5–8.1)

## 2017-12-23 NOTE — Discharge Instructions (Signed)
You need to use your cane so you don't lose your balance and fall again. Return to the ED for any problems listed on the head injury sheet. Have your orthopedist recheck your knee if it bothers you.

## 2018-02-05 DIAGNOSIS — I739 Peripheral vascular disease, unspecified: Secondary | ICD-10-CM | POA: Diagnosis not present

## 2018-02-05 DIAGNOSIS — L110 Acquired keratosis follicularis: Secondary | ICD-10-CM | POA: Diagnosis not present

## 2018-02-05 DIAGNOSIS — L608 Other nail disorders: Secondary | ICD-10-CM | POA: Diagnosis not present

## 2018-02-05 DIAGNOSIS — M79671 Pain in right foot: Secondary | ICD-10-CM | POA: Diagnosis not present

## 2018-02-13 DIAGNOSIS — H04123 Dry eye syndrome of bilateral lacrimal glands: Secondary | ICD-10-CM | POA: Diagnosis not present

## 2018-02-13 DIAGNOSIS — H353132 Nonexudative age-related macular degeneration, bilateral, intermediate dry stage: Secondary | ICD-10-CM | POA: Diagnosis not present

## 2018-03-16 DIAGNOSIS — Z1389 Encounter for screening for other disorder: Secondary | ICD-10-CM | POA: Diagnosis not present

## 2018-03-16 DIAGNOSIS — Z6828 Body mass index (BMI) 28.0-28.9, adult: Secondary | ICD-10-CM | POA: Diagnosis not present

## 2018-03-16 DIAGNOSIS — E782 Mixed hyperlipidemia: Secondary | ICD-10-CM | POA: Diagnosis not present

## 2018-03-16 DIAGNOSIS — R7309 Other abnormal glucose: Secondary | ICD-10-CM | POA: Diagnosis not present

## 2018-03-16 DIAGNOSIS — M159 Polyosteoarthritis, unspecified: Secondary | ICD-10-CM | POA: Diagnosis not present

## 2018-03-16 DIAGNOSIS — I10 Essential (primary) hypertension: Secondary | ICD-10-CM | POA: Diagnosis not present

## 2018-03-16 DIAGNOSIS — E039 Hypothyroidism, unspecified: Secondary | ICD-10-CM | POA: Diagnosis not present

## 2018-03-16 DIAGNOSIS — I4891 Unspecified atrial fibrillation: Secondary | ICD-10-CM | POA: Diagnosis not present

## 2018-04-01 DIAGNOSIS — E43 Unspecified severe protein-calorie malnutrition: Secondary | ICD-10-CM | POA: Diagnosis not present

## 2018-04-01 DIAGNOSIS — E039 Hypothyroidism, unspecified: Secondary | ICD-10-CM | POA: Diagnosis not present

## 2018-04-01 DIAGNOSIS — M109 Gout, unspecified: Secondary | ICD-10-CM | POA: Diagnosis not present

## 2018-04-01 DIAGNOSIS — R630 Anorexia: Secondary | ICD-10-CM | POA: Diagnosis not present

## 2018-04-01 DIAGNOSIS — R601 Generalized edema: Secondary | ICD-10-CM | POA: Diagnosis not present

## 2018-04-01 DIAGNOSIS — R531 Weakness: Secondary | ICD-10-CM | POA: Diagnosis not present

## 2018-04-01 DIAGNOSIS — M159 Polyosteoarthritis, unspecified: Secondary | ICD-10-CM | POA: Diagnosis not present

## 2018-04-01 DIAGNOSIS — E782 Mixed hyperlipidemia: Secondary | ICD-10-CM | POA: Diagnosis not present

## 2018-04-01 DIAGNOSIS — I10 Essential (primary) hypertension: Secondary | ICD-10-CM | POA: Diagnosis not present

## 2018-04-01 DIAGNOSIS — I4891 Unspecified atrial fibrillation: Secondary | ICD-10-CM | POA: Diagnosis not present

## 2018-04-02 DIAGNOSIS — E43 Unspecified severe protein-calorie malnutrition: Secondary | ICD-10-CM | POA: Diagnosis not present

## 2018-04-02 DIAGNOSIS — I4891 Unspecified atrial fibrillation: Secondary | ICD-10-CM | POA: Diagnosis not present

## 2018-04-02 DIAGNOSIS — I10 Essential (primary) hypertension: Secondary | ICD-10-CM | POA: Diagnosis not present

## 2018-04-02 DIAGNOSIS — M159 Polyosteoarthritis, unspecified: Secondary | ICD-10-CM | POA: Diagnosis not present

## 2018-04-02 DIAGNOSIS — E039 Hypothyroidism, unspecified: Secondary | ICD-10-CM | POA: Diagnosis not present

## 2018-04-02 DIAGNOSIS — E782 Mixed hyperlipidemia: Secondary | ICD-10-CM | POA: Diagnosis not present

## 2018-04-03 DIAGNOSIS — E43 Unspecified severe protein-calorie malnutrition: Secondary | ICD-10-CM | POA: Diagnosis not present

## 2018-04-03 DIAGNOSIS — I10 Essential (primary) hypertension: Secondary | ICD-10-CM | POA: Diagnosis not present

## 2018-04-03 DIAGNOSIS — E039 Hypothyroidism, unspecified: Secondary | ICD-10-CM | POA: Diagnosis not present

## 2018-04-03 DIAGNOSIS — I4891 Unspecified atrial fibrillation: Secondary | ICD-10-CM | POA: Diagnosis not present

## 2018-04-03 DIAGNOSIS — E782 Mixed hyperlipidemia: Secondary | ICD-10-CM | POA: Diagnosis not present

## 2018-04-03 DIAGNOSIS — M159 Polyosteoarthritis, unspecified: Secondary | ICD-10-CM | POA: Diagnosis not present

## 2018-04-06 DIAGNOSIS — E039 Hypothyroidism, unspecified: Secondary | ICD-10-CM | POA: Diagnosis not present

## 2018-04-06 DIAGNOSIS — Z743 Need for continuous supervision: Secondary | ICD-10-CM | POA: Diagnosis not present

## 2018-04-06 DIAGNOSIS — E782 Mixed hyperlipidemia: Secondary | ICD-10-CM | POA: Diagnosis not present

## 2018-04-06 DIAGNOSIS — M159 Polyosteoarthritis, unspecified: Secondary | ICD-10-CM | POA: Diagnosis not present

## 2018-04-06 DIAGNOSIS — F05 Delirium due to known physiological condition: Secondary | ICD-10-CM | POA: Diagnosis not present

## 2018-04-06 DIAGNOSIS — I10 Essential (primary) hypertension: Secondary | ICD-10-CM | POA: Diagnosis not present

## 2018-04-06 DIAGNOSIS — I4891 Unspecified atrial fibrillation: Secondary | ICD-10-CM | POA: Diagnosis not present

## 2018-04-06 DIAGNOSIS — E43 Unspecified severe protein-calorie malnutrition: Secondary | ICD-10-CM | POA: Diagnosis not present

## 2018-04-07 DIAGNOSIS — M159 Polyosteoarthritis, unspecified: Secondary | ICD-10-CM | POA: Diagnosis not present

## 2018-04-07 DIAGNOSIS — I4891 Unspecified atrial fibrillation: Secondary | ICD-10-CM | POA: Diagnosis not present

## 2018-04-07 DIAGNOSIS — I10 Essential (primary) hypertension: Secondary | ICD-10-CM | POA: Diagnosis not present

## 2018-04-07 DIAGNOSIS — E782 Mixed hyperlipidemia: Secondary | ICD-10-CM | POA: Diagnosis not present

## 2018-04-07 DIAGNOSIS — E039 Hypothyroidism, unspecified: Secondary | ICD-10-CM | POA: Diagnosis not present

## 2018-04-07 DIAGNOSIS — E43 Unspecified severe protein-calorie malnutrition: Secondary | ICD-10-CM | POA: Diagnosis not present

## 2018-04-08 DIAGNOSIS — E039 Hypothyroidism, unspecified: Secondary | ICD-10-CM | POA: Diagnosis not present

## 2018-04-08 DIAGNOSIS — R531 Weakness: Secondary | ICD-10-CM | POA: Diagnosis not present

## 2018-04-08 DIAGNOSIS — E43 Unspecified severe protein-calorie malnutrition: Secondary | ICD-10-CM | POA: Diagnosis not present

## 2018-04-08 DIAGNOSIS — R601 Generalized edema: Secondary | ICD-10-CM | POA: Diagnosis not present

## 2018-04-08 DIAGNOSIS — I4891 Unspecified atrial fibrillation: Secondary | ICD-10-CM | POA: Diagnosis not present

## 2018-04-08 DIAGNOSIS — R630 Anorexia: Secondary | ICD-10-CM | POA: Diagnosis not present

## 2018-04-08 DIAGNOSIS — M159 Polyosteoarthritis, unspecified: Secondary | ICD-10-CM | POA: Diagnosis not present

## 2018-04-08 DIAGNOSIS — E782 Mixed hyperlipidemia: Secondary | ICD-10-CM | POA: Diagnosis not present

## 2018-04-08 DIAGNOSIS — I10 Essential (primary) hypertension: Secondary | ICD-10-CM | POA: Diagnosis not present

## 2018-04-08 DIAGNOSIS — M109 Gout, unspecified: Secondary | ICD-10-CM | POA: Diagnosis not present

## 2018-05-09 DEATH — deceased

## 2023-06-07 NOTE — Progress Notes (Signed)
DECEASED
